# Patient Record
Sex: Female | Born: 1950
Health system: Southern US, Community
[De-identification: ages and names within clinical notes are randomized; demographics above are authoritative.]

## PROBLEM LIST (undated history)

## (undated) DIAGNOSIS — K219 Gastro-esophageal reflux disease without esophagitis: Secondary | ICD-10-CM

## (undated) DIAGNOSIS — I10 Essential (primary) hypertension: Secondary | ICD-10-CM

## (undated) DIAGNOSIS — R32 Unspecified urinary incontinence: Secondary | ICD-10-CM

## (undated) DIAGNOSIS — E7800 Pure hypercholesterolemia, unspecified: Secondary | ICD-10-CM

## (undated) DIAGNOSIS — E119 Type 2 diabetes mellitus without complications: Secondary | ICD-10-CM

## (undated) DIAGNOSIS — G4733 Obstructive sleep apnea (adult) (pediatric): Secondary | ICD-10-CM

## (undated) DIAGNOSIS — J310 Chronic rhinitis: Secondary | ICD-10-CM

## (undated) HISTORY — DX: Essential (primary) hypertension: I10

## (undated) HISTORY — DX: Unspecified urinary incontinence: R32

## (undated) HISTORY — DX: Chronic rhinitis: J31.0

## (undated) HISTORY — PX: UVULOPALATOPHARYNGOPLASTY: SHX827

## (undated) HISTORY — DX: Gastro-esophageal reflux disease without esophagitis: K21.9

## (undated) HISTORY — DX: Obstructive sleep apnea (adult) (pediatric): G47.33

## (undated) HISTORY — PX: TOTAL ABDOMINAL HYSTERECTOMY: SHX209

## (undated) HISTORY — PX: CHOLECYSTECTOMY: SHX55

## (undated) HISTORY — PX: LIPOMA EXCISION: SHX5283

## (undated) HISTORY — DX: Type 2 diabetes mellitus without complications: E11.9

---

## 1999-04-24 ENCOUNTER — Ambulatory Visit (HOSPITAL_COMMUNITY): Admission: RE | Admit: 1999-04-24 | Discharge: 1999-04-24 | Payer: Self-pay | Admitting: Internal Medicine

## 1999-04-24 ENCOUNTER — Encounter: Payer: Self-pay | Admitting: Internal Medicine

## 1999-05-09 ENCOUNTER — Encounter: Admission: RE | Admit: 1999-05-09 | Discharge: 1999-08-07 | Payer: Self-pay | Admitting: Internal Medicine

## 2000-04-26 ENCOUNTER — Ambulatory Visit (HOSPITAL_COMMUNITY): Admission: RE | Admit: 2000-04-26 | Discharge: 2000-04-26 | Payer: Self-pay | Admitting: General Practice

## 2000-04-26 ENCOUNTER — Encounter: Payer: Self-pay | Admitting: General Practice

## 2000-06-02 ENCOUNTER — Encounter: Admission: RE | Admit: 2000-06-02 | Discharge: 2000-06-29 | Payer: Self-pay | Admitting: Neurological Surgery

## 2000-07-26 ENCOUNTER — Other Ambulatory Visit: Admission: RE | Admit: 2000-07-26 | Discharge: 2000-07-26 | Payer: Self-pay | Admitting: Family Medicine

## 2001-04-21 ENCOUNTER — Encounter: Admission: RE | Admit: 2001-04-21 | Discharge: 2001-07-20 | Payer: Self-pay | Admitting: *Deleted

## 2001-04-28 ENCOUNTER — Ambulatory Visit (HOSPITAL_COMMUNITY): Admission: RE | Admit: 2001-04-28 | Discharge: 2001-04-28 | Payer: Self-pay | Admitting: General Practice

## 2001-04-28 ENCOUNTER — Encounter: Payer: Self-pay | Admitting: General Practice

## 2001-07-12 ENCOUNTER — Ambulatory Visit (HOSPITAL_COMMUNITY): Admission: RE | Admit: 2001-07-12 | Discharge: 2001-07-12 | Payer: Self-pay | Admitting: Pulmonary Disease

## 2001-07-12 ENCOUNTER — Encounter: Payer: Self-pay | Admitting: Pulmonary Disease

## 2002-05-15 ENCOUNTER — Encounter: Payer: Self-pay | Admitting: Cardiovascular Disease

## 2002-05-15 ENCOUNTER — Ambulatory Visit (HOSPITAL_COMMUNITY): Admission: RE | Admit: 2002-05-15 | Discharge: 2002-05-15 | Payer: Self-pay | Admitting: Cardiovascular Disease

## 2002-07-18 ENCOUNTER — Encounter: Payer: Self-pay | Admitting: Sports Medicine

## 2002-07-18 ENCOUNTER — Ambulatory Visit (HOSPITAL_COMMUNITY): Admission: RE | Admit: 2002-07-18 | Discharge: 2002-07-18 | Payer: Self-pay | Admitting: Sports Medicine

## 2002-10-12 ENCOUNTER — Ambulatory Visit (HOSPITAL_COMMUNITY): Admission: RE | Admit: 2002-10-12 | Discharge: 2002-10-12 | Payer: Self-pay | Admitting: Gastroenterology

## 2002-10-13 ENCOUNTER — Encounter: Admission: RE | Admit: 2002-10-13 | Discharge: 2002-10-13 | Payer: Self-pay | Admitting: Gastroenterology

## 2002-10-13 ENCOUNTER — Encounter: Payer: Self-pay | Admitting: Gastroenterology

## 2002-10-24 ENCOUNTER — Encounter (INDEPENDENT_AMBULATORY_CARE_PROVIDER_SITE_OTHER): Payer: Self-pay

## 2002-10-24 ENCOUNTER — Observation Stay (HOSPITAL_COMMUNITY): Admission: RE | Admit: 2002-10-24 | Discharge: 2002-10-25 | Payer: Self-pay

## 2003-07-19 ENCOUNTER — Ambulatory Visit (HOSPITAL_COMMUNITY): Admission: RE | Admit: 2003-07-19 | Discharge: 2003-07-19 | Payer: Self-pay | Admitting: Sports Medicine

## 2003-11-25 ENCOUNTER — Emergency Department (HOSPITAL_COMMUNITY): Admission: EM | Admit: 2003-11-25 | Discharge: 2003-11-25 | Payer: Self-pay | Admitting: Emergency Medicine

## 2005-08-05 ENCOUNTER — Encounter: Admission: RE | Admit: 2005-08-05 | Discharge: 2005-08-05 | Payer: Self-pay | Admitting: General Practice

## 2005-10-21 ENCOUNTER — Ambulatory Visit: Payer: Self-pay | Admitting: Gastroenterology

## 2005-10-27 ENCOUNTER — Ambulatory Visit: Payer: Self-pay | Admitting: Gastroenterology

## 2005-11-30 ENCOUNTER — Ambulatory Visit (HOSPITAL_BASED_OUTPATIENT_CLINIC_OR_DEPARTMENT_OTHER): Admission: RE | Admit: 2005-11-30 | Discharge: 2005-11-30 | Payer: Self-pay | Admitting: Otolaryngology

## 2005-12-06 ENCOUNTER — Ambulatory Visit: Payer: Self-pay | Admitting: Internal Medicine

## 2006-01-01 ENCOUNTER — Encounter (INDEPENDENT_AMBULATORY_CARE_PROVIDER_SITE_OTHER): Payer: Self-pay | Admitting: Specialist

## 2006-01-03 ENCOUNTER — Inpatient Hospital Stay (HOSPITAL_COMMUNITY): Admission: RE | Admit: 2006-01-03 | Discharge: 2006-01-08 | Payer: Self-pay | Admitting: Pulmonary Disease

## 2006-01-03 ENCOUNTER — Ambulatory Visit: Payer: Self-pay | Admitting: Pulmonary Disease

## 2006-03-09 ENCOUNTER — Emergency Department (HOSPITAL_COMMUNITY): Admission: EM | Admit: 2006-03-09 | Discharge: 2006-03-09 | Payer: Self-pay | Admitting: Family Medicine

## 2006-04-05 ENCOUNTER — Ambulatory Visit (HOSPITAL_BASED_OUTPATIENT_CLINIC_OR_DEPARTMENT_OTHER): Admission: RE | Admit: 2006-04-05 | Discharge: 2006-04-05 | Payer: Self-pay | Admitting: Otolaryngology

## 2006-04-11 ENCOUNTER — Ambulatory Visit: Payer: Self-pay | Admitting: Internal Medicine

## 2006-06-04 ENCOUNTER — Ambulatory Visit: Payer: Self-pay | Admitting: Internal Medicine

## 2006-07-09 ENCOUNTER — Ambulatory Visit: Payer: Self-pay | Admitting: Internal Medicine

## 2006-08-09 ENCOUNTER — Ambulatory Visit (HOSPITAL_COMMUNITY): Admission: RE | Admit: 2006-08-09 | Discharge: 2006-08-09 | Payer: Self-pay | Admitting: General Practice

## 2006-08-23 ENCOUNTER — Ambulatory Visit: Payer: Self-pay | Admitting: Internal Medicine

## 2008-05-24 ENCOUNTER — Ambulatory Visit (HOSPITAL_COMMUNITY): Admission: RE | Admit: 2008-05-24 | Discharge: 2008-05-24 | Payer: Self-pay | Admitting: General Practice

## 2008-12-18 ENCOUNTER — Emergency Department (HOSPITAL_COMMUNITY): Admission: EM | Admit: 2008-12-18 | Discharge: 2008-12-18 | Payer: Self-pay | Admitting: Emergency Medicine

## 2009-03-12 ENCOUNTER — Ambulatory Visit: Payer: Self-pay | Admitting: Internal Medicine

## 2009-03-12 DIAGNOSIS — J3089 Other allergic rhinitis: Secondary | ICD-10-CM

## 2009-03-12 DIAGNOSIS — J302 Other seasonal allergic rhinitis: Secondary | ICD-10-CM | POA: Insufficient documentation

## 2009-03-12 DIAGNOSIS — G4733 Obstructive sleep apnea (adult) (pediatric): Secondary | ICD-10-CM | POA: Insufficient documentation

## 2009-03-15 DIAGNOSIS — J209 Acute bronchitis, unspecified: Secondary | ICD-10-CM | POA: Insufficient documentation

## 2009-05-16 ENCOUNTER — Ambulatory Visit: Payer: Self-pay | Admitting: Internal Medicine

## 2009-05-16 DIAGNOSIS — K219 Gastro-esophageal reflux disease without esophagitis: Secondary | ICD-10-CM | POA: Insufficient documentation

## 2009-05-16 DIAGNOSIS — I10 Essential (primary) hypertension: Secondary | ICD-10-CM | POA: Insufficient documentation

## 2009-05-16 DIAGNOSIS — J018 Other acute sinusitis: Secondary | ICD-10-CM | POA: Insufficient documentation

## 2009-06-10 ENCOUNTER — Encounter: Admission: RE | Admit: 2009-06-10 | Discharge: 2009-06-10 | Payer: Self-pay | Admitting: General Practice

## 2010-03-30 ENCOUNTER — Encounter: Payer: Self-pay | Admitting: General Practice

## 2010-03-30 ENCOUNTER — Encounter: Payer: Self-pay | Admitting: Sports Medicine

## 2010-04-08 NOTE — Assessment & Plan Note (Signed)
Summary: allergies///kp   Primary Provider/Referring Provider:  Louanna Raw  CC:  Increased Allergy trouble-went to Dr. Earlene Plater approx 2-3 weeks ago; gave shot and pred pak-no relief. sneezing, itching, and and runny nose. Marland Kitchen  History of Present Illness: 08/29/06 -1. Obstructive sleep apnea/UPPP/CPAP. 2. Rhinitis.  HISTORY:  She continues CPAP at 15 CWP and says life is better.  She is getting enough sleep and is comfortable wearing it with no major issues. Sometimes has some runny nose and sneezing and we talked about overdrying as one possibility.  She does have a humidifier attachment. She thinks she got better with the drop in pressure from 16 to 15.   March 12, 2009--Presents for an acute office visit. Complains of sinus pressure/congestion with white drainage, PND causing cough, increased SOB, wheezing, chills/sweats x3weeks Denies chest pain, dyspnea, orthopnea, hemoptysis, fever, n/v/d, edema, headache,recent travel or antibiotics.    May 16, 2009- OSA, Rhinitis........................................family here Treatment here in january helped only 2 days. Nasal neb and Zpak may heave helped. Complains of nasal congestion , ears pressure. No pain. Coughs up some phlegm. Much nose blowing. Clear mucus with a lot of sneeze. some variable chest tightness. "Inward fever" with dry mouth. UC gave pred taper and injection 2 weeks ago.        Current Medications (verified): 1)  Nexium 40 Mg Cpdr (Esomeprazole Magnesium) .... Take 1 Capsule By Mouth Before First and Last Meals 2)  Lexapro 10 Mg Tabs (Escitalopram Oxalate) .... Take 1 Tablet By Mouth Once A Day 3)  Metoprolol Tartrate 25 Mg Tabs (Metoprolol Tartrate) .... Take 1 Tablet By Mouth Two Times A Day 4)  Hydrocodone-Acetaminophen 10-650 Mg Tabs (Hydrocodone-Acetaminophen) .... 1/2 -1 Every 6 Hours As Needed  Allergies (verified): No Known Drug Allergies  Past History:  Family History: Last updated:  03/12/2009 emphysema/COPD - father asthma - brother #1 heart disease - mother, brother #1 w/ CABG rheumatism - father, both brothers cancer - father (lung) DM - brother #1  Social History: Last updated: 03/12/2009 fomer smoker - quit in 1993, x64yrs, 1/4ppd no alcohol divorced 4 children retired - Psychiatric nurse  Risk Factors: Smoking Status: quit (03/12/2009)  Past Medical History:  RHINITIS (ICD-472.0) OBSTRUCTIVE SLEEP APNEA (ICD-327.23) G E R D Hypertension  Past Surgical History: UPPP for snoring Cholecystectomy T A H and B S O Lipoma  Review of Systems      See HPI       The patient complains of fever and dyspnea on exertion.  The patient denies anorexia, weight loss, weight gain, vision loss, decreased hearing, hoarseness, chest pain, syncope, peripheral edema, prolonged cough, headaches, hemoptysis, abdominal pain, and severe indigestion/heartburn.    Vital Signs:  Patient profile:   60 year old female Height:      64 inches Weight:      252 pounds BMI:     43.41 O2 Sat:      100 % on Room air Pulse rate:   78 / minute BP sitting:   144 / 82  (left arm) Cuff size:   regular  Vitals Entered By: Reynaldo Minium CMA (May 16, 2009 2:12 PM)  O2 Flow:  Room air  Physical Exam  Additional Exam:  General: A/Ox3; pleasant and cooperative, NAD, overweight SKIN: no rash, lesions NODES: no lymphadenopathy HEENT: [place]/AT, EOM- WNL, Conjuctivae- clear, PERRLA, TM-WNL, Nose- clear, mucus bridging, Throat- clear. Mallampati  III s/p UPPP NECK: Supple w/ fair ROM, JVD- none, normal carotid impulses w/o bruits Thyroid-  normal to palpation CHEST: Clear to P&A HEART: RRR, no m/g/r heard ABDOMEN: Soft and nl; ZOX:WRUE, nl pulses, no edema  NEURO: Grossly intact to observation      Impression & Recommendations:  Problem # 1:  RHINITIS (ICD-472.0)  Rhinitis or rhinosinusitis. Repeated steroids didn't help although report of sneezing itching and rhinorhea  suggests allergy.   We discussed Neti pot, antihjistamines and decongestants.  Problem # 2:  OBSTRUCTIVE SLEEP APNEA (ICD-327.23) Assessment: Comment Only  Other Orders: Est. Patient Level III (45409)  Patient Instructions: 1)  Please schedule a follow-up appointment as needed. 2)  Try the Neti pot approach for rinsing your nasal passages 3)  Try Sudafed-PE otc as a decongestnat if needed for stuffiness. This may raise your blood pressure if you take it more than once daily 4)  Try an otc antihistamine as needed for drainage, itching or sneezing- Claritin/ loratadine and allegra/ fexofenadine are good choices.

## 2010-04-08 NOTE — Assessment & Plan Note (Signed)
Summary: Acute NP office visit   CC:  sinus pressure/congestion with white drainage, PND causing cough, increased SOB, wheezing, and chills/sweats x3weeks.  History of Present Illness: PROBLEMS: 1. Obstructive sleep apnea/UPPP/CPAP. 2. Rhinitis.   March 12, 2009--Presents for an acute office visit. Complains of sinus pressure/congestion with white drainage, PND causing cough, increased SOB, wheezing, chills/sweats x3weeks Denies chest pain, dyspnea, orthopnea, hemoptysis, fever, n/v/d, edema, headache,recent travel or antibiotics.      Preventive Screening-Counseling & Management  Alcohol-Tobacco     Smoking Status: quit  Medications Prior to Update: 1)  None  Current Medications (verified): 1)  Nexium 40 Mg Cpdr (Esomeprazole Magnesium) .... Take 1 Capsule By Mouth Before First and Last Meals 2)  Lexapro 10 Mg Tabs (Escitalopram Oxalate) .... Take 1 Tablet By Mouth Once A Day 3)  Metoprolol Tartrate 25 Mg Tabs (Metoprolol Tartrate) .... Take 1 Tablet By Mouth Two Times A Day 4)  Hydrocodone-Acetaminophen 10-650 Mg Tabs (Hydrocodone-Acetaminophen) .... 1/2 -1 Every 6 Hours As Needed 5)  Soma 350 Mg Tabs (Carisoprodol) .... 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  Risk Factors: Smoking Status: quit (03/12/2009)  Past Medical History:   RHINITIS (ICD-472.0) OBSTRUCTIVE SLEEP APNEA (ICD-327.23)  Family History: emphysema/COPD - father asthma - brother #1 heart disease - mother, brother #1 w/ CABG rheumatism - father,  both brothers cancer - father (lung) DM - brother #1  Social History: fomer smoker - quit in 1993, x52yrs, 1/4ppd no alcohol 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 No Comments Medications reviewed with patient Daytime contact number verified with patient. Boone Master CNA  March 12, 2009 4:46 PM    Physical Exam  Additional Exam:  GEN: A/Ox3; pleasant , NAD HEENT:  [place]/AT, , EACs-clear, TMs-wnl, NOSE-clear, THROAT-clear NECK:  Supple w/ fair ROM; no JVD; normal carotid impulses w/o bruits; no thyromegaly or nodules palpated; no lymphadenopathy. RESP  Coarse BS w/ no wheezing.  CARD:  RRR, no m/r/g   GI:   Soft & nt; nml bowel sounds; no organomegaly or masses detected. [place]: Warm bil,  no calf tenderness edema, clubbing, pulses intact Neuro: intact w/ no focal deficits noted.    Impression & Recommendations:  Problem # 1:  BRONCHITIS, ACUTE (ICD-466.0) Flare   REC:  Zpack take as directed.  Mucinex DM two times a day as needed cough/congestion Saline nasal rinses as needed  Zyrtec 10mg  at bedtime as needed drainage.  Please contact office for sooner follow up if symptoms do not improve or worsen  follow up Dr. Maple Hudson in 3 months and as needed     Her updated medication list for this problem includes:    Zithromax Z-pak 250 Mg Tabs (Azithromycin) .Marland Kitchen... Take as directed.  Medications Added to Medication List This  Visit: 1)  Nexium 40 Mg Cpdr (Esomeprazole magnesium) .... Take 1 capsule by mouth before first and last meals 2)  Lexapro 10 Mg Tabs (Escitalopram oxalate) .... Take 1 tablet by mouth once a day 3)  Metoprolol Tartrate 25 Mg Tabs (Metoprolol tartrate) .... Take 1 tablet by mouth two times a day 4)  Hydrocodone-acetaminophen 10-650 Mg Tabs (Hydrocodone-acetaminophen) .... 1/2 -1 every 6 hours as needed 5)  Soma 350 Mg Tabs (Carisoprodol) .... Take 1 tab by mouth at bedtime or up to 1 two times a day as needed 6)  Zithromax Z-pak 250 Mg Tabs (Azithromycin) .... Take as directed.  Complete Medication List: 1)  Nexium 40 Mg Cpdr (Esomeprazole magnesium) .... Take 1 capsule by mouth before first and last meals 2)  Lexapro 10 Mg Tabs (Escitalopram oxalate) .... Take 1 tablet by mouth once a day 3)  Metoprolol Tartrate 25 Mg Tabs (Metoprolol tartrate) .... Take 1 tablet by mouth two times a day 4)  Hydrocodone-acetaminophen 10-650 Mg Tabs (Hydrocodone-acetaminophen) .... 1/2 -1 every 6 hours as needed 5)  Soma 350 Mg Tabs (Carisoprodol) .... Take 1 tab by mouth at bedtime or up to 1 two times a day as needed 6)  Zithromax Z-pak 250 Mg Tabs (Azithromycin) .... Take as directed.  Other Orders: Nebulizer Tx (16109) Est. Patient Level IV (60454)  Patient Instructions: 1)  Zpack take as directed.  2)  Mucinex DM two times a day as needed cough/congestion 3)  Saline nasal rinses as needed  4)  Zyrtec 10mg  at bedtime as needed drainage.  5)  Please contact office for sooner follow up if symptoms do not improve or worsen  6)  follow up Dr. Maple Hudson in 3 months and as needed  Prescriptions: ZITHROMAX Z-PAK 250 MG TABS (AZITHROMYCIN) take as directed.  #1 x 0   Entered and Authorized by:   Rubye Oaks NP   Signed by:   Tammy Parrett NP on 03/12/2009   Method used:   Electronically to        [facility] (retail)       [address]       [place],  [place]  [id number]       Ph: [phone]       Fax: [phone]   RxID:   [id number]    Immunization History:  Influenza Immunization History:    Influenza:  historical (01/08/2008)

## 2010-04-10 ENCOUNTER — Telehealth: Payer: Self-pay | Admitting: Internal Medicine

## 2010-04-11 ENCOUNTER — Ambulatory Visit (INDEPENDENT_AMBULATORY_CARE_PROVIDER_SITE_OTHER): Payer: Self-pay | Admitting: Internal Medicine

## 2010-04-11 ENCOUNTER — Encounter: Payer: Self-pay | Admitting: Internal Medicine

## 2010-04-11 DIAGNOSIS — J310 Chronic rhinitis: Secondary | ICD-10-CM

## 2010-04-11 DIAGNOSIS — G4733 Obstructive sleep apnea (adult) (pediatric): Secondary | ICD-10-CM

## 2010-04-16 NOTE — Progress Notes (Signed)
Summary: req to see cy/ allergies  Phone Note Call from Patient   Caller: Patient Call For: young Summary of Call: pt wants to be worked in w/ dr young tomorrow or mond re: allergies that are "going crazy". pt # [id number] Initial call taken by: Tivis Ringer, CNA,  April 10, 2010 11:29 AM  Follow-up for Phone Call        Pt set to see CY tomorrow at 10am. Carron Curie CMA  April 10, 2010 12:15 PM

## 2010-04-16 NOTE — Assessment & Plan Note (Signed)
Summary: allergies//jrc   Vital Signs:  Patient profile:   60 year old female Height:      64 inches Weight:      259.50 pounds BMI:     44.70 O2 Sat:      100 % on Room air Pulse rate:   82 / minute BP sitting:   128 / 60  (right arm) Cuff size:   large  Vitals Entered By: Gweneth Dimitri RN (April 11, 2010 10:14 AM)  O2 Flow:  Room air CC: Acute Visit.  sneezing, runny nose, itchy eyes, hoarseness, sinus pressure - off and on x  1 month. Comments Medications reviewed with patient Daytime contact number verified with patient. Gweneth Dimitri RN  April 11, 2010 10:15 AM    Primary Provider/Referring Provider:  Louanna Raw  CC:  Acute Visit.  sneezing, runny nose, itchy eyes, hoarseness, and sinus pressure - off and on x  1 month..  History of Present Illness: 08/29/06 -1. Obstructive sleep apnea/UPPP/CPAP. 2. Rhinitis.  HISTORY:  She continues CPAP at 15 CWP and says life is better.  She is getting enough sleep and is comfortable wearing it with no major issues. Sometimes has some runny nose and sneezing and we talked about overdrying as one possibility.  She does have a humidifier attachment. She thinks she got better with the drop in pressure from 16 to 15.   March 12, 2009--Presents for an acute office visit. Complains of sinus pressure/congestion with white drainage, PND causing cough, increased SOB, wheezing, chills/sweats x3weeks Denies chest pain, dyspnea, orthopnea, hemoptysis, fever, n/v/d, edema, headache,recent travel or antibiotics.    May 16, 2009- OSA, Rhinitis........................................family here Treatment here in january helped only 2 days. Nasal neb and Zpak may heave helped. Complains of nasal congestion , ears pressure. No pain. Coughs up some phlegm. Much nose blowing. Clear mucus with a lot of sneeze. some variable chest tightness. "Inward fever" with dry mouth. UC gave pred taper and injection 2 weeks ago.  April 11, 2010 OSA,  Rhinitis........................................child with her Nurse-CC: Acute Visit.  sneezing, runny nose, itchy eyes, hoarseness, sinus pressure - off and on x  1 month. // OSA-She continues to use CPAP at 15 all night every night and is confident it works well and helps her.  Allergy- for past month noting itching eyes, runny nose. OTC meds leave her tired and drained- benadryl. Denies infection, headache, fever or purulence. Minimal wheeze occasionally. Has no rescue inhaler.    Preventive Screening-Counseling & Management  Alcohol-Tobacco     Smoking Status: quit     Packs/Day: 0.5     Year Quit: 1992  Current Medications (verified): 1)  Nexium 40 Mg Cpdr (Esomeprazole Magnesium) .... Take 1 Tablet By Mouth Once A Day 2)  Lexapro 10 Mg Tabs (Escitalopram Oxalate) .... Take 1 Tablet By Mouth Once A Day 3)  Metoprolol Tartrate 25 Mg Tabs (Metoprolol Tartrate) .... Take 1 Tablet By Mouth Two Times A Day 4)  Hydrocodone-Acetaminophen 10-650 Mg Tabs (Hydrocodone-Acetaminophen) .... 1/2 -1 Every 6 Hours As Needed  Allergies (verified): No Known Drug Allergies  Past History:  Past Medical History: Last updated: 05/16/2009  RHINITIS (ICD-472.0) OBSTRUCTIVE SLEEP APNEA (ICD-327.23) G E R D Hypertension  Past Surgical History: Last updated: 05/16/2009 UPPP for snoring Cholecystectomy T A H and B S O Lipoma  Family History: Last updated: 03/12/2009 emphysema/COPD - father asthma - brother #1 heart disease - mother, brother #1 w/ CABG rheumatism - father, both  brothers cancer - father (lung) DM - brother #1  Social History: Last updated: 03/12/2009 fomer smoker - quit in 1993, x105yrs, 1/4ppd no alcohol 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  The patient denies shortness of breath with activity, shortness of breath at rest, productive cough, non-productive cough, coughing up blood, chest pain, irregular heartbeats, acid heartburn, indigestion, loss of appetite, weight change, abdominal pain, difficulty swallowing, sore throat, tooth/dental problems, headaches, anxiety, hand/feet swelling, rash, and change in color of mucus.    Physical Exam  Additional Exam:  General: A/Ox3; pleasant and cooperative, NAD, overweight SKIN: no rash, lesions NODES: no lymphadenopathy HEENT: [place]/AT, EOM- WNL, Conjuctivae- clear, PERRLA, TM-WNL, Nose- clear, mucus bridging, snorting, Throat- clear. Mallampati  III s/p UPPP NECK: Supple w/ fair ROM, JVD- none, normal carotid impulses w/o bruits Thyroid- normal to palpation CHEST: Clear to P&A HEART: RRR, no m/g/r heard ABDOMEN: Soft and nl; ZOX:WRUE, nl pulses, no edema  NEURO: Grossly intact to observation      Impression & Recommendations:  Problem # 1:  OBSTRUCTIVE SLEEP APNEA (ICD-327.23)  Good compliance and control. She is comfortable with the current mask and pressure.   Problem # 2:  RHINITIS (ICD-472.0)  She seems to be describing an allergic rhinitis despite the time of year. Much discomfort with rhinorhea and itching, watering eyes.  She was skin tested many years ago broadly positive. She thinks she needed vaccine then. She has used several steroid nasal sprays which work for [employer] then seem to lose effect. We will try Astepro but I wanted her to try a nonsedating antihistamine and will try loratadine with discusion.   Problem # 3:  BRONCHITIS, ACUTE (ICD-466.0) Chest is clear now. She has noted a little wheeze at times, but doesn't describe it as significant. Watch for asthma and need for a rescue inhaler.   Medications Added to Medication List This Visit: 1)  Nexium 40 Mg Cpdr (Esomeprazole magnesium) .... Take 1 tablet by mouth once a  day 2)  Cpap 15 [facility]  3)  Astepro 0.15 % Soln (Azelastine hcl) .Marland Kitchen.. 1-2 puffs each nostril two times a day as needed 4)  Loratadine 10 Mg Tabs (Loratadine) .Marland Kitchen.. 1 daily as needed allergy  Other Orders: Est. Patient Level IV (45409)  Patient Instructions: 1)  Please schedule a follow-up appointment in 1 year. 2)  Sample/ script Astepro nasal antihistamine spray 3)    1-2 puffs two times a day as needed allergy 4)  Script for loratadine/ Claritin antihistamine, non drowsy 5)      This is over the counter, but try showing the script to the pharmacy and see if that helps.  6)  We can get you back as needed and can do the allergy skin testing if needed.  Prescriptions: LORATADINE 10 MG TABS (LORATADINE) 1 daily as needed allergy  #30 x prn   Entered and Authorized by:   Waymon Budge MD   Signed by:   Waymon Budge MD on 04/11/2010   Method used:   Print then Give to Patient   RxID:   [id number] ASTEPRO 0.15 % SOLN (AZELASTINE HCL) 1-2 puffs each nostril two times a day as needed  #1 x prn  Entered and Authorized by:   Waymon Budge MD   Signed by:   Waymon Budge MD on 04/11/2010   Method used:   Print then Give to Patient   RxID:   [id number]    Orders Added: 1)  Est. Patient Level IV [78469]   Immunization History:  Influenza Immunization History:    Influenza:  historical (01/07/2010)   Immunization History:  Influenza Immunization History:    Influenza:  Historical (01/07/2010)

## 2010-05-19 ENCOUNTER — Other Ambulatory Visit: Payer: Self-pay | Admitting: General Practice

## 2010-05-19 DIAGNOSIS — Z1231 Encounter for screening mammogram for malignant neoplasm of breast: Secondary | ICD-10-CM

## 2010-06-12 ENCOUNTER — Ambulatory Visit
Admission: RE | Admit: 2010-06-12 | Discharge: 2010-06-12 | Disposition: A | Discharge status: Home / Self Care | Payer: Medicaid Other | Source: Ambulatory Visit | Attending: General Practice | Admitting: General Practice

## 2010-06-12 DIAGNOSIS — Z1231 Encounter for screening mammogram for malignant neoplasm of breast: Secondary | ICD-10-CM

## 2010-07-22 NOTE — Assessment & Plan Note (Signed)
[facility]                             PULMONARY OFFICE NOTE   Jocelyn, Little                    MRN:          [id number]  DATE:08/23/2006                            DOB:          August 24, 1950    PULMONARY OUTPATIENT FOLLOW-UP:   PROBLEMS:  1. Obstructive sleep apnea/UPPP/CPAP.  2. Rhinitis.   HISTORY:  She continues CPAP at 15 CWP and says life is better.  She is  getting enough sleep and is comfortable wearing it with no major issues.  Sometimes has some runny nose and sneezing and we talked about  overdrying as one possibility.  She does have a humidifier attachment.  She thinks she got better with the drop in pressure from 16 to 15.   OBJECTIVE:  VITAL SIGNS:  Weight 249 pounds, BP 124/68, pulse 81, room  air saturation 97%.  HEENT:  There is some white mucus in the nasopharynx.  Her throat is not  red.  Speech quality is normal.  There is no stridor.  CHEST:  Clear.  CARDIAC:  Heart sounds normal.   IMPRESSION:  1. Obstructive sleep apnea, now well-controlled on continuous positive      airway pressure at 15 CWP with past history of      uvulopalatopharyngoplasty.  2. Nonspecific rhinitis with sneezing.   PLAN:  We discussed overdrying versus the use of antihistamine-type  drying agents for her nose.  I discussed the availability of Claritin  and gave a sample of Astelin for her to try once more.  She used it a  few years ago and could not remember.  She will continue CPAP at 15.  Schedule return in 3 months, earlier p.r.n.     Clinton D. Maple Hudson, MD, Tonny Bollman, FACP  Electronically Signed    CDY/MedQ  DD: 08/29/2006  DT: 08/30/2006  Job #: [id number]   cc:   Newman Pies, MD
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
## 2010-07-25 NOTE — Discharge Summary (Signed)
Jocelyn, Little           ACCOUNT NO.:  [id number]   MEDICAL RECORD NO.:  [id number]          PATIENT TYPE:  INP   LOCATION:  6710                         FACILITY:  [facility]   PHYSICIAN:  Newman Pies, MD            DATE OF BIRTH:  02/25/1951   DATE OF ADMISSION:  01/01/2006  DATE OF DISCHARGE:  01/08/2006                                 DISCHARGE SUMMARY   ADMISSION DIAGNOSES:  1. Severe obstructive sleep apnea.  2. Nasal airway obstruction.  3. Chronic maxillary sinusitis.  4. Bilateral inferior turbinate hypertrophy.   DISCHARGE DIAGNOSES:  1. Severe obstructive sleep apnea.  2. Nasal airway obstruction.  3. Chronic maxillary sinusitis.  4. Bilateral inferior turbinate hypertrophy.  5. Postobstructive pulmonary edema.   PROCEDURE PERFORMED:  1. Uvulopalatopharyngoplasty.  2. Endoscopic bilateral maxillary antrostomy with tissue removal.  3. Bilateral partial inferior turbinate resection.   SERVICES CONSULTED:  1. Critical care medicine.  2. Physical therapy.  3. Speech therapy.   HISTORY OF PRESENT ILLNESS:  Jocelyn Little is a 60 year old African-  American female with a history of severe obstructive sleep apnea and chronic  sinusitis.  She also complained of chronic bilateral nasal airway  obstruction, causing significant difficulty with using her CPAP machines.   On examination, patient was noted to have bilateral inferior turbinate  hypertrophy and a chronic purulent drainage of the maxillary antrums.  CT  scan shows bilateral chronic maxillary sinusitis.  Due to the chronicity of  her illnesses, patient would like to seek physical intervention to improve  her obstructive sleep apnea as well as to treat her nasal airway obstruction  and chronic sinusitis.  Sleep study was performed and this showed  respiratory stress index of greater than 100.  Patient was consulted due to  the severity of sleep apnea.  Surgical intervention may not completely cure  her  sleep apnea.  However, physical approach such as  uvulopalatopharyngoplasty may lessen her demand on the CPAP machine, such as  decreasing pressure needed to achieve a good oxygen saturations.  After the  risks, benefits, alternatives, and details of the surgical procedures were  discussed, patient would like to proceed with the procedures.  On January 01, 2006, patient was admitted for the above-needed procedures.   HOSPITAL COURSE:  Patient now underwent procedure successfully without any  complications.  Postoperatively, patient was transferred to the stepdown K  unit for overnight observation.  She did well overnight with no difficulty.  Her oxygen saturation was maintained above 90% with oxygen by nasal cannula.  On postop day #1, patient was transferred to the regular surgical floor.  However, during that day, patient gradually developed respiratory distress  with decreasing oxygen saturations.  And chest x-ray performed that day  showed significant pulmonary edema.  As a result, she was intubated and  transferred to the intensive care unit.  Critical care medicines and service  was consulted for vent management and critical care.  She was placed on IV  Lasix and IV Decadron.  Her postop obstructive pulmonary edema gradually  improved over the subsequent  two days.  On January 05, 2006, she was  extubated successfully.  She was offered to observe in a intensive care unit  for one additional day.  Her oxygen saturations remained stable above 90  degrees even on room air.  Speech pathology service was also consulted to  rule out any aspirations.  Functional endoscopic evaluation of swallow was  performed.  Patient was noted to have no aspirations.  She was started on a  p.o. diet which she tolerated well.  She was transferred from intensive care  unit to stepdown K unit on January 06, 2006.  She continued to do well with  good oxygen saturations even on room air.  On January 07, 2006,  she was  transferred to the regular surgical floor.  After extubations, physical  therapist was also consulted to help with strength reconditioning.  It was  recommended that patient should have continue home care physical therapy.  In addition, a rolling walker was ordered for the patient.  On January 08, 2006, patient was discharged home in good condition.  At the time of  discharge, patient was tolerating diet well.  She was able to ambulate with  help of rolling walker.   DISCHARGE MEDICATIONS:  Patient was instructed to resume home medications.  In addition, she was given:  1. Lortab elixir 15 mL p.o. q. 4-6 h. p.r.n. pain  2. Keflex 500 mg p.o. q.d. for five additional days.   POSTOPERATIVE CARE:  [facility] for [facility] care.   DISCHARGE DIET:  Soft mechanical diet, advance as tolerated.   POSTOPERATIVE FOLLOWUP:  Patient will be seen in my office in one week.      Newman Pies, MD  Electronically Signed     ST/MEDQ  D:  01/08/2006  T:  01/09/2006  Job:  [id number]

## 2010-07-25 NOTE — Op Note (Signed)
NAMEISOBEL, EISENHUTH NO.:  [id number]   MEDICAL RECORD NO.:  [id number]          PATIENT TYPE:  INP   LOCATION:  2111                         FACILITY:  [facility]   PHYSICIAN:  Newman Pies, MD            DATE OF BIRTH:  13-Jun-1950   DATE OF PROCEDURE:  01/01/2006  DATE OF DISCHARGE:                                 OPERATIVE REPORT   SURGEON:  Karle Barr, MD.   PREOPERATIVE DIAGNOSES:  1. Bilateral chronic maxillary sinusitis.  2. Bilateral nasal airway obstruction.  3. Bilateral inferior turbinate hypertrophy.  4. Severe obstructive sleep apnea.   POSTOPERATIVE DIAGNOSES:  1. Bilateral chronic maxillary sinusitis.  2. Bilateral nasal airway obstruction.  3. Bilateral inferior turbinate hypertrophy.  4. Severe obstructive sleep apnea.   PROCEDURES PERFORMED:  1. Endoscopic bilateral maxillary antrostomy with tissue removal.  2. Bilateral partial inferior turbinate resection.  3. Uvulopalatopharyngoplasty.   ANESTHESIA:  General endotracheal tube anesthesia.   COMPLICATIONS:  None.   ESTIMATED BLOOD LOSS:  100 cc.   INDICATIONS FOR PROCEDURE:  The patient is a 60 year old African American  female with a history of severe obstructive sleep apnea, as documented on  polysomnography study.  In addition, she has been experiencing chronic nasal  obstructions, chronic rhinosinusitis and bilateral inferior turbinate  hypertrophy.  She was previously fitted with a CPAP machine.  However, due  to her chronic nasal obstruction and chronic rhinosinusitis, she has not  been able to tolerate her CPAP.  She would like to have surgical  intervention to improve her nasal breathing, as well as to reduce her  reliance on her CPAP.  In light of the severity of her sleep apnea, she was  noted that the surgery likely will not completely wean her off the CPAP  machine.  Rather, it may improve her tolerance of the CPAP machine.  The  risks, benefits, alternatives and details of  the procedures were discussed  with the patient.  She would like to proceed with the above-stated  procedures.  All questions were answered and informed consent was obtained.   DESCRIPTION OF THE PROCEDURES:  The patient was taken to the operating room  and placed supine on the operating table.  General endotracheal tube  anesthesia was administered by the anesthesiologist.  Preoperative IV  antibiotics and Decadron were given.  The patient was then positioned and  prepped and draped in a standard fashion for nasal surgery.  Pledgets soaked  with Afrin were placed in both nasal cavities for vasoconstriction.  The  pledgets were subsequently removed.  Under the guidance of the 0-degree  endoscope, 1% lidocaine with 1:100,000 epinephrine was injected around the  inferior and middle turbinates, as well as the lateral nasal wall  bilaterally.  Attention was first focused on the right side.  The patient  was noted to have severe inferior turbinate hypertrophy and nasal mucosal  congestion.  The inferior half of the interrupted turbinate was then clamped  with a hemostat.  The inferior half of the inferior turbinate was then  resected using a cross-cutting scissors.  Hemostasis was achieved using Bucy  suction electrocautery.  Attention was then focused on performing the  maxillary antrostomy.  A standard uncinectomy was performed.  The middle  turbinate was medialized using a Therapist, nutritional.  The maxillary antrum was  then entered using a maxillary probe.  The antrum was enlarged using a  combination of a backbiter and a Blakesley and Tru-Cut forceps.  Polypoid  mucosa was noted within the maxillary antrum.  The mucosa was removed using  a Blakesley forceps.  Hemostasis was achieved using pledgets soaked with  Afrin.  Attention was then focused on the left side.  The standard inferior  turbinate partial resection was performed in the standard fashion similar to  the right side.  Hemostasis,  again, was achieved using Bucy electrocautery.  A standard uncinectomy was then performed.  The middle turbinate was  medialized.  The maxillary antrum was identified and enlarged using a  combination of backbiter forceps and Blakesley and Tru-Cut forceps.  A  moderate amount of polypoid tissue was removed from around the antrum, as  well as the maxillary cavity.  FloSeal was then applied to the surgical site  in both nasal cavities.   Attention was then focused on performing the uvulopalatopharyngoplasty.  The  oral cavity was exposed using the Crowe-Davis mouth gag.  She was noted to  have 1+ tonsils bilaterally; however, she has a significant amount of  redundant mucosa around the tonsillar fossa, as well as in the pharyngeal  wall.  She also has a significantly enlarged uvula.  A standard  tonsillectomy was performed on each side.  The tonsillectomy was performed  using the Coblator device.  The uvula, as well as the posterior 3 cm of the  soft palate was then resected using the Bovie electrocautery.  Hemostasis  was achieved using a combination of the Coblator device and suction  electrocautery.  The posterior edge of the resected margin was then sutured  to the inferior edge of the resection margin using interrupted 3-0 Vicryl  sutures.  Once both tonsillar fossae were sutured, the soft palate was also  sutured tight to increase tension on the palatal mucosa, as well as the  pharyngeal mucosa.  At the conclusion of the procedure, the patient was  noted to have a widely opened pharyngeal airway.  An orogastric tube was  then passed to evacuate the stomach contents.  The Crowe-Davis mouth gag was  removed, and subsequent inspection of the lips, gums, tongue, teeth and  surrounding structures revealed no evidence of injury.  The care of the  patient was turned over to the anesthesiologist.  The patient was awakened  from anesthesia without difficulty.  She was transferred to the  recovery room in stable condition.   OPERATIVE FINDINGS:  Significant hypertrophied inferior turbinates  bilaterally.  The patient was also noted to have polypoid mucosa around both  maxillary antrum and maxillary sinus cavities.  Significant mucosal  redundancy was also noted within the pharynx.  A standard  uvulopalatopharyngoplasty was performed without difficulty.   SPECIMENS REMOVED:  Bilateral tonsils, uvula, bilateral inferior turbinates  (inferior half), and tissue obtained around the maxillary antrum and  maxillary sinus cavity.   FOLLOWUP CARE:  The patient will be observed in the progressive care unit  overnight.  If the patient does well, she will be discharged home with pain  medication and antibiotics.      Newman Pies, MD  Electronically Signed     ST/MEDQ  D:  01/03/2006  T:  01/03/2006  Job:  [id number]

## 2010-07-25 NOTE — Procedures (Signed)
Jocelyn Little, MALLICOAT NO.:  [id number]   MEDICAL RECORD NO.:  [id number]          PATIENT TYPE:  OUT   LOCATION:  SLEEP CENTER                 FACILITY:  [facility]   PHYSICIAN:  Clinton D. Maple Hudson, MD, FCCP, FACPDATE OF BIRTH:  1950/03/[birth year]   DATE OF STUDY:  04/05/2006                            NOCTURNAL POLYSOMNOGRAM   INDICATION FOR STUDY:  Hypersomnia with sleep apnea.   RESULTS:  Epward sleepiness score 17/24. BMI 41.6. Weight 244 pounds.   A baseline diagnostic NP SG on November 30, 2005 recorded an AHI of  106.6 per hour. CPAP titration is requested.   MEDICATIONS:  Are listed and reviewed.   SLEEP ARCHITECTURE:  Total sleep time 320 minutes with sleep efficiency  76%. Stage 1 was 7%; Stage 2, 84%; Stages 3 and 4, absent. REM 9% of  total sleep time. Sleep latency 40 minutes, REM latency 278 minutes.  Awake after sleep onset, 65 minutes. Arousal index 59.2, indicating  increased sleep fragmentation. No bedtime medication was taken.   RESPIRATORY DATA:  CPAP titration protocol:  CPAP was titrated to 15  CWP, AHI 2.3 per hour. A small Respironics Comfort Gel nasal mask was  used with chin strap and heated humidifier.   OXYGEN DATA:  Snoring was prevented and oxygen saturation held at 94% on  room air at final CPAP control.   CARDIAC DATA:  Sinus rhythm with occasional PVC.   MOVEMENT/PARASOMNIA:  Occasional limb jerk with little effect on sleep.   IMPRESSION/RECOMMENDATIONS:  1. Successful CPAP titration to 16 CWP, AHI 2.3 per hour. A small      Respironics Comfort Gel nasal mask was used with chin strap and      heated humidifier.  2. Diagnostic nocturnal polysomnogram on November 30, 2005 had      recorded an AHI of 106.6 per hour.      Clinton D. Maple Hudson, MD, [facility], FACP  Diplomate, Biomedical engineer of Sleep Medicine  Electronically Signed    CDY/MEDQ  D:  04/11/2006 09:19:46  T:  04/11/2006 18:00:42  Job:  [id number]

## 2010-07-25 NOTE — Op Note (Signed)
   Jocelyn Little, Jocelyn Little                       ACCOUNT NO.:  [id number]   MEDICAL RECORD NO.:  [id number]                   PATIENT TYPE:  AMB   LOCATION:  ENDO                                 FACILITY:  [facility]   PHYSICIAN:  James L. Malon Kindle., M.D.          DATE OF BIRTH:  Jul 14, 1950   DATE OF PROCEDURE:  10/12/2002  DATE OF DISCHARGE:                                 OPERATIVE REPORT   PROCEDURE:  Esophagogastroduodenoscopy and biopsy.   MEDICATIONS:  Cetacaine spray, Fentanyl 40 mcg and Versed 5 mg IV.   SURGEON:  James L. Randa Evens, M.D.   INDICATIONS FOR PROCEDURE:  Abdominal pain and burning.   DESCRIPTION OF PROCEDURE:  After the procedure had been explained to the  patient consent was obtained.  The patient was placed in the left lateral  decubitus position and the Olympus scope was inserted and advanced.  The  stomach was entered and the pylorus identified and passed.  There was fairly  marked duodenitis with some erosions and no frank ulceration.  The duodenal  bulb and the second duodenum was normal.  The pyloric channel and antrum  were normal.  Biopsies were taken for rapid urease testing for Helicobacter.  The fundus and cardia were seen well in the retroflex view and were normal.  There was a hiatal hernia with a 5 cm length with a widely patent GE  junction.  The esophagus was mucosally normal.  The scope was withdrawn.  The patient tolerated the procedure well.   ASSESSMENT:  1. Duodenitis.  2. Gastroesophageal reflux disease.   PLAN:  We will continue on the Nexium and go ahead with the gallbladder  ultrasound as planned.                                               James L. Malon Kindle., M.D.    Waldron Session  D:  10/12/2002  T:  10/12/2002  Job:  [id number]   cc:   Louanna Raw  [address]  [place]  [place] [address]  Fax: [phone]

## 2010-07-25 NOTE — Assessment & Plan Note (Signed)
[facility]                             PULMONARY OFFICE NOTE   ANYLA, ISRAELSON                    MRN:          [id number]  DATE:07/09/2006                            DOB:          26-May-1950    PULMONARY OFFICE FOLLOWUP   PROBLEM:  Obstructive sleep apnea.   HISTORY:  Home trial of CPAP at 16 CWP through [facility].  She says she sleeps well with it, except that she sneezes during the  night.  Dry mouth quite uncomfortable despite humidifier.  Feels better  without the CPAP mask.  When she wears it, she notices marked rhinorrhea  and post-nasal drip in the mornings.  She does have some history of  seasonal pollen allergic rhinitis any way and, in this weather, notices  a little tightness in her chest when she walks.  There has been no  bloody or purulent discharge.  No headache.   MEDICATIONS:  1. Toprol.  2. Nexium.  3. Lexapro.  4. CPAP at 16 CWP.   No medication allergy.   OBJECTIVE:  Weight 260 pounds, BP 126/78, pulse 70, room air saturation  97%.  Nasal airway looks dry and clear.  Pharynx is post palatoplasty.  Lung fields are clear.  There is no stridor.  No cough or wheeze.  Heart sounds are regular without murmur.  She seems alert in no distress  with no pressure marks on her face.   IMPRESSION:  Obstructive sleep apnea, still trying to adjust continuous  positive airway pressure for comfort.  Component probably a combination  of vasomotor rhinitis and allergic rhinitis.  Possibly minimal asthma.  I do not think she is describing exertional angina.   PLAN:  1. [facility] is going to reduce CPAP flow from 16 to 15      CWP, and work with her on humidifier adjustments.  2. Try sample of Nasacort AQ 2 sprays each nostril at bedtime.  3. Try sample of Astelin 1 spray each nostril p.r.n. on first waking      in the mornings.  4. Schedule return in 6 weeks, earlier p.r.n.  5. Important that she work  on weight loss as discussed.     Clinton D. Maple Hudson, MD, [facility], FACP  Electronically Signed    CDY/MedQ  DD: 07/09/2006  DT: 07/09/2006  Job #: [id number]   cc:   Newman Pies, MD

## 2010-07-25 NOTE — Assessment & Plan Note (Signed)
[facility]                             PULMONARY OFFICE NOTE   Jocelyn Little, Jocelyn Little                    MRN:          [id number]  DATE:06/04/2006                            DOB:          09-Jan-1951    PROBLEM:  60 year old woman seen on sleep medicine consultation at the  kind request of Dr. Suszanne Conners, concerned about sleep apnea.   HISTORY:  She had been diagnosed with sleep apnea 10 or 12 years ago.  Those studies are not available. She had been fitted with CPAP and  managed through a home care company in [facility] She has since moved  to this area, lost contact with the home care company, and stopped using  her CPAP. She does not know what the pressure was. The family has been  reporting loud snoring and she admits that she fights daytime  sleepiness, sometimes while driving. She had a palatoplasty and nasal  surgery. At that time, she lost weight and with those two modifications,  the family reported she stopped snoring for a while. Gradually, she  regained at least 30 pounds and snoring has returned. Bedtime is between  9pm and 11pm estimating 15-30 minutes sleep latency. She is up 3-6 times  during the night, usually very briefly, sometimes for the bathroom. She  is physically comfortable in bed. Final wake up is between 6:30am and  7am. A nocturnal polysomnogram at the [facility] on  11/30/05, recorded an AHI of 106.6 per hour. She returned on 04/05/2006  for CPAP titration to 16 CWP for an AHI of 2.3 per hour with markedly  improved oxygenation.   MEDICATIONS:  1. Toprol.  2. Nexium.  3. Lexapro.   REVIEW OF SYSTEMS:  Weight loss with throat surgery, now 30-40 pound  weight gain since October. Snoring and witnessed apneas, daytime  sleepiness, and non-restorative sleep. Depression. Seasonal rhinitis.   PAST HISTORY:  Obstructive sleep apnea. Remote tonsillectomy.  Palatopharyngeal plasty and nasal surgery in October.  Hypertension.  Allergic rhinitis. No history of heart, lung, or thyroid disease.   PAST SURGICAL HISTORY:  Additional procedures include cholecystectomy,  spine surgery, sinus surgery, and hysterectomy.   SOCIAL HISTORY:  Quit smoking in 1991. Quit alcohol. Divorced. She is  not working. Little caffeine.   FAMILY HISTORY:  Father snored some. Nobody diagnosed with sleep apnea.  Mother with heart disease. Father with rheumatism and cancer.   OBJECTIVE:  Weight 258 pounds, blood pressure 142/88, pulse 77, room air  saturation 97%. Pleasant, alert, obese woman. Vitiligo. No adenopathy.  HEENT:  Status post palatoplasty. Palette spacing 3/4. No strider.  Normal voice quality. Nasal airway is a little edematous with clear  mucoid secretions bilaterally, but not visible polyps or post nasal  drip. No periorbital edema. No conjunctival injection.  LUNGS:  Clear to PNA.  HEART:  Sounds regular without murmur or gallop.  EXTREMITIES:  There is no restlessness or tremor. No peripheral edema.   IMPRESSION:  Obstructive sleep apnea, recurrent with weight gain after  palatoplasty.   PLAN:  1. We discussed the physiology, available treatments, and  medical      concerns of sleep apnea.  2. Her responsibility to lose weight and to be alert and be safe while      driving were emphasized.  3. Treatment options were reviewed.  4. We are setting up a home trial of CPAP at 16 CWP and she will      return in one month for follow up.   I very much appreciate the chance to meet Jocelyn Little, and would be  happy to discuss her care.     Clinton D. Maple Hudson, MD, [facility], FACP  Electronically Signed    CDY/MedQ  DD: 06/04/2006  DT: 06/05/2006  Job #: [id number]   cc:   Newman Pies, MD

## 2010-07-25 NOTE — Op Note (Signed)
NAMESTARNISHA, BATREZ NO.:  [id number]   MEDICAL RECORD NO.:  [id number]                   PATIENT TYPE:  OBV   LOCATION:  0478                                 FACILITY:  [facility]   PHYSICIAN:  Lorre Munroe., M.D.            DATE OF BIRTH:  1950/03/29   DATE OF PROCEDURE:  10/24/2002  DATE OF DISCHARGE:  10/25/2002                                 OPERATIVE REPORT   PREOPERATIVE DIAGNOSES:  Acute and chronic cholecystitis and cholelithiasis.   POSTOPERATIVE DIAGNOSES:  Acute and chronic cholecystitis and  cholelithiasis.   OPERATION:  Laparoscopic cholecystectomy.   SURGEON:  Lebron Conners, M.D.   ASSISTANT:  Anselm Pancoast. Zachery Dakins, M.D.   ANESTHESIA:  General.   DESCRIPTION OF PROCEDURE:  After the patient was monitored and anesthetized  and had routine preparation and draping of the abdomen, I anesthetized the  area just below the umbilicus and made a short transverse incision, entered  the midline fascia and bluntly entered the peritoneal cavity. I placed a #0  Vicryl pursestring suture and secured a Hasson cannula. I then inflated the  abdomen with CO2 and examined the contents and found no abnormalities except  for acute an chronic inflammation of the gallbladder with adhesions to the  undersurface. After placing three additional ports, I took down the  adhesions and drained the bile from the gallbladder because it was tense. I  then grasped it, elevated it towards the right shoulder and dissected down  the inferior surface coming to the hepatoduodenal ligament. I then carefully  dissected in that area until I was able to clearly identify the cystic duct  emerging from the infundibulum of the gallbladder. I clipped it with four  clips and divided it between the two closest to the gallbladder. I then  searched for the cystic artery and clipped and divided it similarly. I then  pulled the gallbladder away from the liver and dissected using  cautery and  suction, blunt dissection and detached the gallbladder from the liver.  Hemostasis was obtained with the cautery and was good. I placed the  gallbladder in a plastic pouch and removed it through the umbilical  incision. I tied that pursestring suture and then copiously irrigated the  right upper quadrant again and removed the irrigant. Sponge, needle and  instrument counts were correct. Hemostasis was good. The clips were secure  and there was no evident leakage of bile. I then removed the two lateral  ports under direct vision and after allowing the CO2 to escape, I removed  the epigastric port. I closed all skin incisions with intracuticular 4-0  Vicryl and Steri-Strips. She tolerated the operation well.  Lorre Munroe., M.D.    WB/MEDQ  D:  11/18/2002  T:  11/18/2002  Job:  [id number]

## 2010-07-25 NOTE — Procedures (Signed)
[name], SANS NO.:  [id number]   MEDICAL RECORD NO.:  [id number]          PATIENT TYPE:  OUT   LOCATION:  SLEEP CENTER                 FACILITY:  [facility]   PHYSICIAN:  Clinton D. Maple Hudson, MD, FCCP, FACPDATE OF BIRTH:  20-Apr[birth year]1951   DATE OF STUDY:  11/30/2005                              NOCTURNAL POLYSOMNOGRAM   REFERRING PHYSICIAN:  Dr. Illene Silver   INDICATIONS FOR STUDY:  Hypersomnia with sleep apnea.   EPWORTH SLEEPINESS SCORE:  19/24.   BMI:  42.6.   WEIGHT:  250 pounds.   HOME MEDICATION:  Toprol, Lexapro, Nexium.   The patient says that she was not able to wear her CPAP in the past because  of nasal dryness and congestion.  A diagnostic NPSG protocol was requested.   SLEEP ARCHITECTURE:  Total sleep time 332 minutes with sleep efficiency 76%.  Stage I was 11%, stage II 86%, stages III and IV were absent, REM 4% of  total sleep time.  Sleep latency 12 minutes, REM latency 121 minutes, awake  after sleep onset 95 minutes, arousal index markedly increased at 88.2 and  indicating marked sleep fragmentation.  This corresponded to respiratory  events.   RESPIRATORY DATA:  Apnea/hypopnea index (AHI, RDI) 106.6 obstructive events  per hour indicating very severe obstructive sleep apnea/hypopnea syndrome.  There were 568 obstructive apneas and 23 hypopneas.  Events were not  positional.  REM AHI of 115 per hour.   OXYGEN DATA:  Moderate to loud snoring in all sleep positions with oxygen  desaturation to a nadir of 77%.  Mean oxygen saturation through the study  was 93% on room air.   CARDIAC DATA:  Normal sinus rhythm.   MOVEMENTS/PARASOMNIA:  No significant limb jerks or unusual movement.  Bathroom x2.   IMPRESSION/RECOMMENDATIONS:  1. Severe obstructive sleep apnea/hypopnea syndrome, AHI 106.6 per hour      with marked associated sleep fragmentation non positional events,      moderate to loud snoring and oxygen desaturation to 77%.  2. Consider  return for CPAP titration or alternative therapies as      appropriate.   NOTE:  The patient states she was unable to wear CPAP previously because of  nasal discomfort.      Clinton D. Maple Hudson, MD, FCCP, FACP  Diplomate, Biomedical engineer of Sleep Medicine  Electronically Signed     CDY/MEDQ  D:  12/06/2005 18:59:50  T:  12/07/2005 13:46:25  Job:  [id number]

## 2010-07-25 NOTE — Cardiovascular Report (Signed)
NAMEXAVIA, KNISKERN NO.:  [id number]   MEDICAL RECORD NO.:  [id number]                   PATIENT TYPE:  OIB   LOCATION:  2899                                 FACILITY:  [facility]   PHYSICIAN:  Nicki Guadalajara, M.D.                  DATE OF BIRTH:  1950-11-09   DATE OF PROCEDURE:  05/15/2002  DATE OF DISCHARGE:                              CARDIAC CATHETERIZATION   INDICATION:  The patient is a 60 year old African American female, who  initially was seen last summer with recurrent episodes of chest pain.  She  has a history of sleep apnea, on CPAP, history of GERD, and mild  hyperlipidemia.  A Cardiolite study last summer showed markedly aerobic  capacity with significant breast attenuation artifact with anterior thinning  without statistical stents for ischemia.  The patient has continued to  experience recurrent episodes of chest pain.  Because of recurrent  symptomatology, she is now referred for definitive diagnostic cardiac  catheterization.   DESCRIPTION OF PROCEDURE:  After premedication with Versed 2 mg  intravenously, the patient was prepped and draped in the usual fashion.  Her  right femoral artery was punctured anteriorly and a 6 French sheath was  inserted.  Diagnostic catheterization was done with a 6 French Judkins 4  left and right coronary catheters.  A 6 French pigtail catheter was used for  biplane cine left ventriculography as well as distal aortography.  Hemostasis was obtained by direct manual pressure.  The patient tolerated  the procedure well.   HEMODYNAMIC DATA:  Central aortic pressure was 155/77, mean 111. Left  ventricular pressure was 155/11, post A wave 23.   ANGIOGRAPHIC DATA:  1. Left main coronary artery:  The left main coronary artery was     angiographically normal and bifurcated into an LAD and left circumflex     system.  2. Left anterior descending:  The LAD was a moderate sized vessel.  It     wrapped around  the AV apex.  The vessel gave rise to one large proximal     diagonal vessel.  The midportion of the LAD seemed to dip     intramyocardially.  There was no obstructive disease or evidence for     spasm.  3. Circumflex artery:  The circumflex vessel was angiographically normal,     gave rise to one major bifurcating marginal vessel.  4. Right coronary artery:  The right coronary artery was angiographically     normal, gave rise to the posterior descending artery, and ended in a     small posterolateral system.   Biplane cine left ventriculography revealed normal LV function.   DISTAL AORTOGRAPHY:  Distal aortography did not demonstrate any significant  aortoiliac disease.  There was smooth 10-20% ostial narrowing of the right  renal artery.    IMPRESSION:  1. Normal left ventricular function.  2. Essentially normal coronary arteries with  portion of the mid left     anterior descending dipping intramyocardially without evidence for     obstructive disease.                                                  Nicki Guadalajara, M.D.    TK/MEDQ  D:  05/15/2002  T:  05/15/2002  Job:  [id number]   cc:   Cardiac Catheterization Laboratory   Van Clines, M.D.  [facility]

## 2011-04-10 ENCOUNTER — Encounter: Payer: Self-pay | Admitting: Internal Medicine

## 2011-04-13 ENCOUNTER — Ambulatory Visit: Payer: Self-pay | Admitting: Internal Medicine

## 2011-04-22 ENCOUNTER — Ambulatory Visit: Payer: Self-pay | Admitting: Internal Medicine

## 2011-05-15 ENCOUNTER — Ambulatory Visit (INDEPENDENT_AMBULATORY_CARE_PROVIDER_SITE_OTHER): Payer: Medicaid Other | Admitting: Internal Medicine

## 2011-05-15 ENCOUNTER — Encounter: Payer: Self-pay | Admitting: Internal Medicine

## 2011-05-15 VITALS — BP 120/70 | HR 66 | Ht 64.0 in | Wt 267.8 lb

## 2011-05-15 DIAGNOSIS — J209 Acute bronchitis, unspecified: Secondary | ICD-10-CM

## 2011-05-15 DIAGNOSIS — G4733 Obstructive sleep apnea (adult) (pediatric): Secondary | ICD-10-CM

## 2011-05-15 DIAGNOSIS — J310 Chronic rhinitis: Secondary | ICD-10-CM

## 2011-05-15 MED ORDER — ALBUTEROL SULFATE HFA 108 (90 BASE) MCG/ACT IN AERS: 2.0000 | INHALATION_SPRAY | Freq: Four times a day (QID) | RESPIRATORY_TRACT | Status: DC | PRN

## 2011-05-15 NOTE — Progress Notes (Signed)
05/15/11 60 yoF former smoker followed for OSA/ UPPP/CPAP, allergic rhinitis, complicated by HBP, GERD LOV-05/16/09 daughter here Continues CPAP from [facility] but wants to change home care companies because of service. Using a nasal mask and humidifier. Able to use it all night every night routinely. Describes perennial rhinitis with nasal congestion, sneezing, drainage. Prescription nasal sprays in the past have helped only briefly. Chest has been comfortably controlled with a little wheeze during the winter only, possibly with a cold.  ROS-see HPI Constitutional:   No-   weight loss, night sweats, fevers, chills, fatigue, lassitude. HEENT:   No-  headaches, difficulty swallowing, tooth/dental problems, sore throat,       +  sneezing, itching, ear ache, nasal congestion, post nasal drip,  CV:  No-   chest pain, orthopnea, PND, swelling in lower extremities, anasarca, dizziness, palpitations Resp: No-   shortness of breath with exertion or at rest.              No-   productive cough,  No non-productive cough,  No- coughing up of blood.              No-   change in color of mucus.  No- wheezing.   Skin: No-   rash or lesions. GI:  No-   heartburn, indigestion, abdominal pain, nausea, vomiting, GU: No-   dysuria,  MS:  No-   joint pain or swelling.  No- decreased range of motion.  No- back pain. Neuro-     nothing unusual Psych:  No- change in mood or affect. No depression or anxiety.  No memory loss.  OBJ- Physical Exam General- Alert, Oriented, Affect-appropriate, Distress- none acute, overweight Skin- diffuse spotting- nevi or vitelligo Lymphadenopathy- none Head- atraumatic            Eyes- Gross vision intact, PERRLA, conjunctivae and secretions clear            Ears- Hearing, canals-normal            Nose- Clear, no-Septal dev,  +mucus, polyps, erosion, perforation             Throat- Mallampati III- s/p UPPP , mucosa clear , drainage- none, tonsils- atrophic Neck-  flexible , trachea midline, no stridor , thyroid nl, carotid no bruit Chest - symmetrical excursion , unlabored           Heart/CV- RRR , no murmur , no gallop  , no rub, nl s1 s2                           - JVD- none , edema- none, stasis changes- none, varices- none           Lung- clear to P&A, wheeze- none, cough with deep breath , dullness-none, rub- none           Chest wall-  Abd-  Br/ Gen/ Rectal- Not done, not indicated Extrem- cyanosis- none, clubbing, none, atrophy- none, strength- nl Neuro- grossly intact to observation

## 2011-05-15 NOTE — Patient Instructions (Addendum)
Script for "rescue" inhaler -  Use 2 puffs, up to 4 times daily if needed  Suggest you try an otc antihistamine like Claritin/ loratadine or Allegra/ fexofenadine for your nose  Sample for trial- Dymista nasal spray-  1 spray each nostril, once or twice daily  Order- [facility]- help re-establish with a new DME for CPAP support. Nothing needed right now.   Dx OSA

## 2011-05-18 NOTE — Assessment & Plan Note (Signed)
Plan-she will meet with [facility] about changing DME companies.

## 2011-05-18 NOTE — Assessment & Plan Note (Signed)
Rescue inhaler to hold.

## 2011-05-18 NOTE — Assessment & Plan Note (Addendum)
Perennial and seasonal allergic rhinitis Plan-loratadine, sample Dymista

## 2011-05-26 ENCOUNTER — Other Ambulatory Visit: Payer: Self-pay | Admitting: Family Medicine

## 2011-05-26 ENCOUNTER — Other Ambulatory Visit (HOSPITAL_COMMUNITY)
Admission: RE | Admit: 2011-05-26 | Discharge: 2011-05-26 | Disposition: A | Discharge status: Home / Self Care | Payer: Medicare Other | Source: Ambulatory Visit | Attending: Family Medicine | Admitting: Family Medicine

## 2011-05-26 DIAGNOSIS — Z1159 Encounter for screening for other viral diseases: Secondary | ICD-10-CM | POA: Insufficient documentation

## 2011-05-26 DIAGNOSIS — Z01419 Encounter for gynecological examination (general) (routine) without abnormal findings: Secondary | ICD-10-CM | POA: Insufficient documentation

## 2011-06-03 ENCOUNTER — Emergency Department (INDEPENDENT_AMBULATORY_CARE_PROVIDER_SITE_OTHER): Payer: Medicaid Other

## 2011-06-03 ENCOUNTER — Encounter (HOSPITAL_COMMUNITY): Payer: Self-pay | Admitting: *Deleted

## 2011-06-03 ENCOUNTER — Emergency Department (INDEPENDENT_AMBULATORY_CARE_PROVIDER_SITE_OTHER)
Admission: EM | Admit: 2011-06-03 | Discharge: 2011-06-03 | Disposition: A | Discharge status: Home / Self Care | Payer: Medicaid Other | Source: Home / Self Care | Attending: Emergency Medicine | Admitting: Emergency Medicine

## 2011-06-03 ENCOUNTER — Telehealth (HOSPITAL_COMMUNITY): Payer: Self-pay | Admitting: *Deleted

## 2011-06-03 DIAGNOSIS — J40 Bronchitis, not specified as acute or chronic: Secondary | ICD-10-CM

## 2011-06-03 HISTORY — DX: Pure hypercholesterolemia, unspecified: E78.00

## 2011-06-03 MED ORDER — ALBUTEROL SULFATE HFA 108 (90 BASE) MCG/ACT IN AERS
INHALATION_SPRAY | RESPIRATORY_TRACT | Status: AC
  Filled 2011-06-03: qty 6.7

## 2011-06-03 MED ORDER — ALBUTEROL SULFATE (5 MG/ML) 0.5% IN NEBU
5.0000 mg | INHALATION_SOLUTION | Freq: Once | RESPIRATORY_TRACT | Status: AC
  Administered 2011-06-03: 5 mg via RESPIRATORY_TRACT

## 2011-06-03 MED ORDER — PREDNISONE 20 MG PO TABS
ORAL_TABLET | ORAL | Status: AC
  Filled 2011-06-03: qty 3

## 2011-06-03 MED ORDER — GUAIFENESIN ER 600 MG PO TB12: 600.0000 mg | ORAL_TABLET | Freq: Two times a day (BID) | ORAL | Status: DC

## 2011-06-03 MED ORDER — AZITHROMYCIN 250 MG PO TABS: 250.0000 mg | ORAL_TABLET | Freq: Every day | ORAL | Status: AC

## 2011-06-03 MED ORDER — PREDNISONE 20 MG PO TABS
60.0000 mg | ORAL_TABLET | Freq: Once | ORAL | Status: AC
  Administered 2011-06-03: 60 mg via ORAL

## 2011-06-03 MED ORDER — ALBUTEROL SULFATE (5 MG/ML) 0.5% IN NEBU
INHALATION_SOLUTION | RESPIRATORY_TRACT | Status: AC
  Filled 2011-06-03: qty 0.5

## 2011-06-03 MED ORDER — ALBUTEROL SULFATE HFA 108 (90 BASE) MCG/ACT IN AERS: 2.0000 | INHALATION_SPRAY | RESPIRATORY_TRACT | Status: DC

## 2011-06-03 MED ORDER — ALBUTEROL SULFATE (5 MG/ML) 0.5% IN NEBU
INHALATION_SOLUTION | RESPIRATORY_TRACT | Status: AC
  Filled 2011-06-03: qty 1

## 2011-06-03 MED ORDER — GUAIFENESIN-CODEINE 100-10 MG/5ML PO SYRP: 10.0000 mL | ORAL_SOLUTION | Freq: Four times a day (QID) | ORAL | Status: AC | PRN

## 2011-06-03 MED ORDER — DEXAMETHASONE 4 MG PO TABS: ORAL_TABLET | ORAL | Status: AC

## 2011-06-03 MED ORDER — IPRATROPIUM BROMIDE 0.02 % IN SOLN
0.5000 mg | Freq: Once | RESPIRATORY_TRACT | Status: AC
  Administered 2011-06-03: 0.5 mg via RESPIRATORY_TRACT

## 2011-06-03 NOTE — ED Notes (Signed)
Pt. called and said she was confused about what medicine she is supposed to start today and what she is supposed to start tomorrow. I asked what were the medicines. She said Dexamethasone and Azithromycin. She said she was given 1 dose of Prednisone here. I told her she is to start the Azithromycin today and the Dexamethasone tomorrow because that is the steroid. Pt. asked what she is supposed to do about her back pain. I told her she can take Tylenol if needed. Jocelyn Little 06/03/2011

## 2011-06-03 NOTE — ED Notes (Signed)
Peak flow pre treatment 350/350/270    Post treatment 250/400/400

## 2011-06-03 NOTE — ED Notes (Signed)
Breathing easier 

## 2011-06-03 NOTE — ED Provider Notes (Signed)
History     CSN: [id number]  Arrival date & time 06/03/11  9528   First MD Initiated Contact with Patient 06/03/11 1015      Chief Complaint  Patient presents with  . Cough  . Back Pain  . Nasal Congestion    (Consider location/radiation/quality/duration/timing/severity/associated sxs/prior treatment) HPI Comments: Patient reports nonproductive cough, chest soreness, mid upper back pain  for the past 33 weeks. Symptoms started after having an upper respiratory illness. Patient states that her chest and back soreness is worse with coughing, taking deep breath in, bending or twisting toward. Reports some wheezing, shortness of breath. patient is history seasonal allergies, and is on inhaled steroids and albuterol as needed. Patient has not been using her albuterol.   Patient is a 61 y.o. female presenting with cough. The history is provided by the patient. No language interpreter was used.  Cough The current episode started more than 1 week ago. The problem occurs constantly. The problem has been gradually worsening. The cough is non-productive. There has been no fever. Associated symptoms include chest pain, myalgias, shortness of breath and wheezing. Pertinent negatives include no chills, no sweats, no weight loss, no rhinorrhea and no sore throat. She has tried cough syrup for the symptoms. The treatment provided no relief. Smoker: Former smoker. Her past medical history does not include bronchitis, pneumonia, COPD, emphysema or asthma.    Past Medical History  Diagnosis Date  . Rhinitis   . OSA (obstructive sleep apnea)   . GERD (gastroesophageal reflux disease)   . HTN (hypertension)   . High cholesterol     Past Surgical History  Procedure Date  . Uvulopalatopharyngoplasty   . Cholecystectomy   . Total abdominal hysterectomy   . Lipoma excision     Family History  Problem Relation Age of Onset  . Emphysema Father   . Asthma Brother   . Heart disease Mother   . Heart  disease Brother   . Rheum arthritis Father   . Rheum arthritis Brother   . Lung cancer Father   . Diabetes Brother     History  Substance Use Topics  . Smoking status: Former Smoker -- 0.8 packs/day for 20 years    Types: Cigarettes    Quit date: 03/10/1991  . Smokeless tobacco: Not on file  . Alcohol Use: No    OB History    Grav Para Term Preterm Abortions TAB SAB Ect Mult Living                  Review of Systems  Constitutional: Negative for chills and weight loss.  HENT: Negative for sore throat and rhinorrhea.   Respiratory: Positive for cough, shortness of breath and wheezing.   Cardiovascular: Positive for chest pain.  Musculoskeletal: Positive for myalgias.    Allergies  Naproxen  Home Medications   Current Outpatient Rx  Name Route Sig Dispense Refill  . ALBUTEROL SULFATE HFA 108 (90 BASE) MCG/ACT IN AERS Inhalation Inhale 2 puffs into the lungs every 6 (six) hours as needed for wheezing or shortness of breath. 1 Inhaler prn  . ASPIRIN 81 MG PO TABS Oral Take 81 mg by mouth daily.    . BUDESONIDE-FORMOTEROL FUMARATE 160-4.5 MCG/ACT IN AERO Inhalation Inhale 2 puffs into the lungs 2 (two) times daily.    Marland Kitchen ESCITALOPRAM OXALATE 10 MG PO TABS Oral Take 10 mg by mouth daily.    Marland Kitchen ESOMEPRAZOLE MAGNESIUM 40 MG PO CPDR Oral Take 40 mg by mouth daily  before breakfast.    . METOPROLOL TARTRATE 25 MG PO TABS Oral Take 25 mg by mouth 2 (two) times daily.    Marland Kitchen ONE-DAILY MULTI VITAMINS PO TABS Oral Take 1 tablet by mouth daily.    Marland Kitchen FISH OIL 1000 MG PO CAPS Oral Take 1 capsule by mouth 2 (two) times daily.    Marland Kitchen PRESCRIPTION MEDICATION  Cholesterol medication    . AZELASTINE HCL 137 MCG/SPRAY NA SOLN Nasal Place 1 spray into the nose 2 (two) times daily. Use in each nostril as directed    . AZITHROMYCIN 250 MG PO TABS Oral Take 1 tablet (250 mg total) by mouth daily. 2 tabs po on day one, then one tablet po once daily on days 2-5. 6 tablet 0  . DEXAMETHASONE 4 MG PO TABS   4 tabs po at once on day one, 4 tabs po at once on day 2 8 tablet 0  . GUAIFENESIN ER 600 MG PO TB12 Oral Take 1 tablet (600 mg total) by mouth 2 (two) times daily. 14 tablet 0  . GUAIFENESIN-CODEINE 100-10 MG/5ML PO SYRP Oral Take 10 mLs by mouth 4 (four) times daily as needed for cough or congestion (Every 4-6 hours as needed). 120 mL 0    BP 148/82  Pulse 70  Temp(Src) 97.5 F (36.4 C) (Oral)  Resp 12  SpO2 95%  Physical Exam  Nursing note and vitals reviewed. Constitutional: She is oriented to person, place, and time. She appears well-developed and well-nourished.  HENT:  Head: Normocephalic and atraumatic.  Eyes: Conjunctivae and EOM are normal.  Neck: Normal range of motion.  Cardiovascular: Normal rate, regular rhythm, normal heart sounds and intact distal pulses.   No murmur heard. Pulmonary/Chest: Effort normal and breath sounds normal. No respiratory distress. She exhibits tenderness.       Wheezing bilaterally. Rales left side.  Abdominal: Soft. Bowel sounds are normal. She exhibits no distension. There is no tenderness.  Musculoskeletal: Normal range of motion. She exhibits no edema and no tenderness.  Neurological: She is alert and oriented to person, place, and time.  Skin: Skin is warm and dry.  Psychiatric: She has a normal mood and affect. Her behavior is normal. Judgment and thought content normal.    ED Course  Procedures (including critical care time)  Labs Reviewed - No data to display Dg Chest 2 View  06/03/2011  *RADIOLOGY REPORT*  Clinical Data: Wheezing, shortness of breath and productive cough.  CHEST - 2 VIEW  Comparison: 03/09/2006.  Findings: Trachea is midline.  Heart size normal.  Lungs are clear. No pleural fluid.  IMPRESSION: No acute findings.  Original Report Authenticated By: Reyes Ivan, M.D.     1. Bronchitis     X-ray reviewed by myself. No pneumonia. See radiologist report for full details  MDM  Pt seen and examined. Pt  speaking in full sentences but has wheezing, rales L side, Pt given albuterol/atrovent, predinsone. Will tx with ABX even if cxr neg as pt has had sx for 2-3 weeks.  Peak flow done.  pretx 350/350/270.  calculated peak flow 447. Will do CXR because of focal lung findings Will re-evaluate.    Posttx 400/400. Improved air movement, decreased wheezing on repeat exam. Patient states she feels significantly better. Discussed imaging findings with her. Sending home with azithromycin, and  albuterol, short course of steroids, Mucinex, cough syrup at night. Will have her continue her support as written, and have her followup with Dr. Parke Simmers in  several days.  Luiz Blare, MD 06/03/11 1248

## 2011-06-03 NOTE — ED Notes (Signed)
Pt with c/o cough and mid to upper back pain - onset of cough congestion x 2 - 3 weeks - onset of back pain x one week - worse when coughing and movement - productive cough whitish sputum

## 2011-06-03 NOTE — Discharge Instructions (Signed)
Take two puffs from your albuterol inhaler every 4 hours. Finish the steroids unless your doctor tells you to stop. Finish the antibiotics, even if you feel better. Do a peak flow, once the morning and once at night. Write this down. The number should be going up, not down. You may decrease the frequency of your albuterol inhaler as the numbers go up and you start feeling better. You may start the steroids tomorrow, as you have already taken today's dose. You may take tylenol 1 gram up to 4 times a day as needed for pain. This with 600 mg of motrin is an effective combination for pain and fever. Make sure you drink extra fluids. Return if you get worse, have a fever >100.4, or any other concerns.  ° °Go to www.goodrx.com to look up your medications. This will give you a list of where you can find your prescriptions at the most affordable prices.  °

## 2011-06-30 ENCOUNTER — Other Ambulatory Visit (HOSPITAL_COMMUNITY): Payer: Self-pay | Admitting: Family Medicine

## 2011-06-30 DIAGNOSIS — Z1231 Encounter for screening mammogram for malignant neoplasm of breast: Secondary | ICD-10-CM

## 2011-07-24 ENCOUNTER — Ambulatory Visit (HOSPITAL_COMMUNITY)
Admission: RE | Admit: 2011-07-24 | Discharge: 2011-07-24 | Disposition: A | Discharge status: Home / Self Care | Payer: Medicaid Other | Source: Ambulatory Visit | Attending: Family Medicine | Admitting: Family Medicine

## 2011-07-24 DIAGNOSIS — Z1231 Encounter for screening mammogram for malignant neoplasm of breast: Secondary | ICD-10-CM | POA: Insufficient documentation

## 2011-10-27 ENCOUNTER — Telehealth: Payer: Self-pay | Admitting: Internal Medicine

## 2011-10-27 NOTE — Telephone Encounter (Signed)
I spoke with pt and she stated she never heard back from any DME after her visit from 05/15/11. Looked in pt chart order was sent to [facility]. I called [facility] and spoke with [place] and was advised they did speak with pt in March and she did not need anything at that time. She stated pt was suppose to call them when she needed supplies, etc. Per Mitzi Davenport pt needs to call them and advised them what she needs so they can get this set up. [phone]) [phone].  lmomtcb x1 for pt to make her aware of this

## 2011-10-27 NOTE — Telephone Encounter (Signed)
Per OV note 05/15/11: Order- [facility]- help re-establish with a new DME for CPAP support. Nothing needed right now. Dx OSA   lmomtcb x1 for pt

## 2011-10-28 NOTE — Telephone Encounter (Signed)
[email or web address].Karter Haire, CMA  

## 2011-10-29 NOTE — Telephone Encounter (Signed)
Spoke with pt and informed her that we spoke with [place] at [facility] and that they were waiting for pt to call and let then know when she needed supplies.  Pt given [facility] number and she will contact them for her supplies.

## 2011-12-07 ENCOUNTER — Encounter (HOSPITAL_COMMUNITY): Payer: Self-pay | Admitting: Family Medicine

## 2011-12-07 ENCOUNTER — Emergency Department (HOSPITAL_COMMUNITY)
Admission: EM | Admit: 2011-12-07 | Discharge: 2011-12-07 | Disposition: A | Discharge status: Home / Self Care | Payer: Medicare Other | Attending: Emergency Medicine | Admitting: Emergency Medicine

## 2011-12-07 ENCOUNTER — Emergency Department (HOSPITAL_COMMUNITY): Payer: Medicare Other

## 2011-12-07 DIAGNOSIS — Z801 Family history of malignant neoplasm of trachea, bronchus and lung: Secondary | ICD-10-CM | POA: Insufficient documentation

## 2011-12-07 DIAGNOSIS — G4733 Obstructive sleep apnea (adult) (pediatric): Secondary | ICD-10-CM | POA: Insufficient documentation

## 2011-12-07 DIAGNOSIS — Z888 Allergy status to other drugs, medicaments and biological substances status: Secondary | ICD-10-CM | POA: Insufficient documentation

## 2011-12-07 DIAGNOSIS — Z87891 Personal history of nicotine dependence: Secondary | ICD-10-CM | POA: Insufficient documentation

## 2011-12-07 DIAGNOSIS — Z833 Family history of diabetes mellitus: Secondary | ICD-10-CM | POA: Insufficient documentation

## 2011-12-07 DIAGNOSIS — Z8249 Family history of ischemic heart disease and other diseases of the circulatory system: Secondary | ICD-10-CM | POA: Insufficient documentation

## 2011-12-07 DIAGNOSIS — Z825 Family history of asthma and other chronic lower respiratory diseases: Secondary | ICD-10-CM | POA: Insufficient documentation

## 2011-12-07 DIAGNOSIS — Z8489 Family history of other specified conditions: Secondary | ICD-10-CM | POA: Insufficient documentation

## 2011-12-07 DIAGNOSIS — M436 Torticollis: Secondary | ICD-10-CM

## 2011-12-07 DIAGNOSIS — IMO0002 Reserved for concepts with insufficient information to code with codable children: Secondary | ICD-10-CM

## 2011-12-07 DIAGNOSIS — I10 Essential (primary) hypertension: Secondary | ICD-10-CM | POA: Insufficient documentation

## 2011-12-07 MED ORDER — DIAZEPAM 5 MG PO TABS
5.0000 mg | ORAL_TABLET | Freq: Once | ORAL | Status: AC
  Administered 2011-12-07: 5 mg via ORAL
  Filled 2011-12-07: qty 1

## 2011-12-07 MED ORDER — DIAZEPAM 5 MG PO TABS: 5.0000 mg | ORAL_TABLET | Freq: Four times a day (QID) | ORAL | Status: DC | PRN

## 2011-12-07 MED ORDER — HYDROCODONE-ACETAMINOPHEN 5-325 MG PO TABS
1.0000 | ORAL_TABLET | Freq: Once | ORAL | Status: AC
  Administered 2011-12-07: 1 via ORAL
  Filled 2011-12-07: qty 1

## 2011-12-07 MED ORDER — HYDROCODONE-ACETAMINOPHEN 5-500 MG PO TABS: 1.0000 | ORAL_TABLET | Freq: Four times a day (QID) | ORAL | Status: DC | PRN

## 2011-12-07 NOTE — ED Provider Notes (Signed)
History     CSN: [id number]  Arrival date & time 12/07/11  1025   First MD Initiated Contact with Patient 12/07/11 1115      Chief Complaint  Patient presents with  . Headache    (Consider location/radiation/quality/duration/timing/severity/associated sxs/prior treatment) HPI Comments: Jocelyn Little is a 61 y.o. Female who presents with complaint of neck pain. States she woke up this morning and when tried to turn to the other side, felt pain in right side of the neck. States since then pain continues. States pain worsened with movement of the head. Stats pain radiating up into the right side of the head. Denies visual changes. Denies numbness or weakness in hands or feet. Denies nausea, vomiting. Also complaining of  Pressure over right sinuses, but states she has chronic congestion due to allergies. No injuries.   The history is provided by the patient.    Past Medical History  Diagnosis Date  . Rhinitis   . OSA (obstructive sleep apnea)   . GERD (gastroesophageal reflux disease)   . HTN (hypertension)   . High cholesterol     Past Surgical History  Procedure Date  . Uvulopalatopharyngoplasty   . Cholecystectomy   . Total abdominal hysterectomy   . Lipoma excision     Family History  Problem Relation Age of Onset  . Emphysema Father   . Asthma Brother   . Heart disease Mother   . Heart disease Brother   . Rheum arthritis Father   . Rheum arthritis Brother   . Lung cancer Father   . Diabetes Brother     History  Substance Use Topics  . Smoking status: Former Smoker -- 0.8 packs/day for 20 years    Types: Cigarettes    Quit date: 03/10/1991  . Smokeless tobacco: Not on file  . Alcohol Use: No    OB History    Grav Para Term Preterm Abortions TAB SAB Ect Mult Living                  Review of Systems  Constitutional: Negative for fever and chills.  HENT: Positive for ear pain, congestion, neck pain and sinus pressure. Negative for sore throat and  neck stiffness.   Eyes: Negative for photophobia, pain and visual disturbance.  Respiratory: Negative.   Cardiovascular: Negative.   Gastrointestinal: Negative.   Genitourinary: Negative for dysuria.  Skin: Negative.   Neurological: Positive for headaches. Negative for dizziness, weakness, light-headedness and numbness.  Hematological: Negative.     Allergies  Naproxen  Home Medications   Current Outpatient Rx  Name Route Sig Dispense Refill  . ALBUTEROL SULFATE HFA 108 (90 BASE) MCG/ACT IN AERS Inhalation Inhale 2 puffs into the lungs every 6 (six) hours as needed. Shortness of breath    . ASPIRIN 81 MG PO TABS Oral Take 81 mg by mouth daily.    Marland Kitchen ESCITALOPRAM OXALATE 10 MG PO TABS Oral Take 10 mg by mouth daily.    Marland Kitchen ESOMEPRAZOLE MAGNESIUM 40 MG PO CPDR Oral Take 40 mg by mouth daily before breakfast.    . METOPROLOL TARTRATE 25 MG PO TABS Oral Take 25 mg by mouth 2 (two) times daily.    Marland Kitchen ONE-DAILY MULTI VITAMINS PO TABS Oral Take 1 tablet by mouth daily.    Marland Kitchen FISH OIL 1000 MG PO CAPS Oral Take 1 capsule by mouth 2 (two) times daily.      BP 150/57  Pulse 73  Temp 98.2 F (36.8 C) (Oral)  Resp 16  SpO2 97%  Physical Exam  Nursing note and vitals reviewed. Constitutional: She is oriented to person, place, and time. She appears well-developed and well-nourished.  Neck: Normal range of motion. Neck supple.       TTP over right trapezius and sternocleidomastoid muscle. Pain with turning head to the left , back, and front. No masses, no pulsatile swelling, no bruising.  Cardiovascular: Normal rate, regular rhythm and normal heart sounds.   Pulmonary/Chest: Effort normal and breath sounds normal. No respiratory distress. She has no wheezes. She has no rales.  Abdominal: Soft. Bowel sounds are normal. She exhibits no distension. There is no tenderness.  Musculoskeletal: Normal range of motion.  Neurological: She is alert and oriented to person, place, and time.       5/5 and  equal upper and lower extremity strength. Normal finger to nose, heel to shin, no pronator drift.    ED Course  Procedures (including critical care time)  Pt with right sided neck pain, reprodible with palpation and turning p's head. She is not having any changes in vision, no weakness or numbness in extremity, no difficulty walking. Will get x-ray. Suspect muscle spasms vs arthritis.   No results found for this or any previous visit. Dg Cervical Spine Complete  12/07/2011  *RADIOLOGY REPORT*  Clinical Data: Right-sided neck pain, no known injury  CERVICAL SPINE - COMPLETE 4+ VIEW  Comparison: Chest radiograph - 06/03/2011  Findings:  C1 to the superior endplate of C7 is visualized on the lateral radiograph.  There is suboptimal evaluation of the cervical thoracic junction secondary to overlying osseous and soft tissue structures.  There is mild straightening of the expected cervical lordosis.  No definite anterolisthesis or retrolisthesis.  The dens is normally positioned between the lateral masses of C1.  The bilateral facets are normally aligned.  Cervical vertebral body heights are preserved.  Prevertebral soft tissues are normal.  There is minimal osteophytosis about the anterior endplates about the C4 - C5, C5 - C6 and C6 - C7 intervertebral disc levels with preservation of disc space heights.  Mild multilevel neural foraminal narrowing is suspected on the right, though possibly accentuated due to obliquity.  No definite left-sided neural foraminal narrowing.  Partial calcification of the nuchal ligament.  Bilateral cervical ribs 67 are suspected.  Regional soft tissues are normal.  IMPRESSION: 1.  Mild multilevel right-sided neural foraminal narrowing is suspected, though possibly accentuated due to obliquity. 2.  Minimal multilevel anteriorly directed osteophytosis with preservation of disc spaces. 3. Incidental note is made of bilateral cervical ribs.   Original Report Authenticated By: Waynard Reeds, M.D.     12:44 PM Pt feeing better with valium and vicodin in ED. I have low suspicion for any vascular abnormality given pt has no symptoms of a CVA or dissection. Plan to d/c home with close follow up for recheck. Muscle relaxant, pain meds at home.   1. Torticollis   2. Degenerative disk disease       MDM          Lottie Mussel, PA 12/07/11 1255

## 2011-12-07 NOTE — ED Notes (Addendum)
Tatyana, PA at bedside. 

## 2011-12-07 NOTE — ED Notes (Signed)
Pt sts she woke up this am to right sided neck pain radiating into the top of her head. Denies N,V. sts some blurry vision.

## 2011-12-07 NOTE — ED Provider Notes (Signed)
Medical screening examination/treatment/procedure(s) were performed by non-physician practitioner and as supervising physician I was immediately available for consultation/collaboration.   Celene Kras, MD 12/07/11 [phone]

## 2011-12-09 ENCOUNTER — Encounter (HOSPITAL_COMMUNITY): Payer: Self-pay | Admitting: *Deleted

## 2011-12-09 ENCOUNTER — Emergency Department (HOSPITAL_COMMUNITY)
Admission: EM | Admit: 2011-12-09 | Discharge: 2011-12-09 | Disposition: A | Discharge status: Home / Self Care | Payer: Medicaid Other | Source: Home / Self Care | Attending: Emergency Medicine | Admitting: Emergency Medicine

## 2011-12-09 DIAGNOSIS — L600 Ingrowing nail: Secondary | ICD-10-CM

## 2011-12-09 MED ORDER — CEPHALEXIN 500 MG PO CAPS: 500.0000 mg | ORAL_CAPSULE | Freq: Two times a day (BID) | ORAL | Status: DC

## 2011-12-09 MED ORDER — TRAMADOL HCL 50 MG PO TABS: 50.0000 mg | ORAL_TABLET | Freq: Four times a day (QID) | ORAL | Status: DC | PRN

## 2011-12-09 NOTE — ED Provider Notes (Signed)
History     CSN: [id number]  Arrival date & time 12/09/11  1454   First MD Initiated Contact with Patient 12/09/11 1714      Chief Complaint  Patient presents with  . Ingrown Toenail    (Consider location/radiation/quality/duration/timing/severity/associated sxs/prior treatment) Patient is a 61 y.o. female presenting with toe pain. The history is provided by the patient.  Toe Pain This is a recurrent problem. The current episode started more than 1 week ago. The problem occurs daily. The problem has not changed since onset.Nothing aggravates the symptoms. Relieved by: trimming toenail.    Past Medical History  Diagnosis Date  . Rhinitis   . OSA (obstructive sleep apnea)   . GERD (gastroesophageal reflux disease)   . HTN (hypertension)   . High cholesterol     Past Surgical History  Procedure Date  . Uvulopalatopharyngoplasty   . Cholecystectomy   . Total abdominal hysterectomy   . Lipoma excision     Family History  Problem Relation Age of Onset  . Emphysema Father   . Asthma Brother   . Heart disease Mother   . Heart disease Brother   . Rheum arthritis Father   . Rheum arthritis Brother   . Lung cancer Father   . Diabetes Brother     History  Substance Use Topics  . Smoking status: Former Smoker -- 0.8 packs/day for 20 years    Types: Cigarettes    Quit date: 03/10/1991  . Smokeless tobacco: Not on file  . Alcohol Use: No    OB History    Grav Para Term Preterm Abortions TAB SAB Ect Mult Living                  Review of Systems  Constitutional: Negative.   Respiratory: Negative.   Cardiovascular: Negative.   Musculoskeletal: Positive for arthralgias. Negative for myalgias, back pain, joint swelling and gait problem.  Skin: Negative.     Allergies  Naproxen  Home Medications   Current Outpatient Rx  Name Route Sig Dispense Refill  . ALBUTEROL SULFATE HFA 108 (90 BASE) MCG/ACT IN AERS Inhalation Inhale 2 puffs into the lungs every 6 (six)  hours as needed. Shortness of breath    . ASPIRIN 81 MG PO TABS Oral Take 81 mg by mouth daily.    . CEPHALEXIN 500 MG PO CAPS Oral Take 1 capsule (500 mg total) by mouth 2 (two) times daily. 20 capsule 0  . DIAZEPAM 5 MG PO TABS Oral Take 1 tablet (5 mg total) by mouth every 6 (six) hours as needed (for muscle spasms). 12 tablet 0  . ESCITALOPRAM OXALATE 10 MG PO TABS Oral Take 10 mg by mouth daily.    Marland Kitchen ESOMEPRAZOLE MAGNESIUM 40 MG PO CPDR Oral Take 40 mg by mouth daily before breakfast.    . HYDROCODONE-ACETAMINOPHEN 5-500 MG PO TABS Oral Take 1-2 tablets by mouth every 6 (six) hours as needed for pain. 15 tablet 0  . METOPROLOL TARTRATE 25 MG PO TABS Oral Take 25 mg by mouth 2 (two) times daily.    Marland Kitchen ONE-DAILY MULTI VITAMINS PO TABS Oral Take 1 tablet by mouth daily.    Marland Kitchen FISH OIL 1000 MG PO CAPS Oral Take 1 capsule by mouth 2 (two) times daily.    . TRAMADOL HCL 50 MG PO TABS Oral Take 1 tablet (50 mg total) by mouth every 6 (six) hours as needed for pain. 15 tablet 0    BP 151/68  Pulse  76  Temp 98.8 F (37.1 C) (Oral)  Resp 20  SpO2 98%  Physical Exam  Nursing note and vitals reviewed. Constitutional: She is oriented to person, place, and time. Vital signs are normal. She appears well-developed and well-nourished. She is active and cooperative.  HENT:  Head: Normocephalic.  Eyes: Conjunctivae normal are normal. Pupils are equal, round, and reactive to light. No scleral icterus.  Neck: Trachea normal. Neck supple.  Cardiovascular: Normal rate and regular rhythm.   Pulmonary/Chest: Effort normal and breath sounds normal.  Musculoskeletal: Normal range of motion.       Right foot: Normal.       Feet:       Edema and erythema at left great lateral edge of toenail  Neurological: She is alert and oriented to person, place, and time. No cranial nerve deficit or sensory deficit.  Skin: Skin is warm and dry.  Psychiatric: She has a normal mood and affect. Her speech is normal and  behavior is normal. Judgment and thought content normal. Cognition and memory are normal.    ED Course  Procedures (including critical care time)  Labs Reviewed - No data to display No results found.   1. Ingrown right big toenail       MDM  Begin antibiotics as prescribed, follow up with podiatrist for nail removal prn        Johnsie Kindred, NP 12/09/11 1719

## 2011-12-09 NOTE — ED Notes (Signed)
Pt  Reports   painfull       r  Big toe     X  3-4  Weeks          Tender  To  Touch   aggrevated   Several  Days      Ago  When  She  Cut  The  Nails  With  Clippers        Pt  Ambulatory  To  Treatment  Room     Appears  In no  Acute  Distress

## 2011-12-10 NOTE — ED Provider Notes (Signed)
Medical screening examination/treatment/procedure(s) were performed by non-physician practitioner and as supervising physician I was immediately available for consultation/collaboration.  Raynald Blend, MD 12/10/11 [phone]

## 2012-04-20 ENCOUNTER — Emergency Department (HOSPITAL_COMMUNITY): Payer: Medicare Other

## 2012-04-20 ENCOUNTER — Encounter (HOSPITAL_COMMUNITY): Payer: Self-pay

## 2012-04-20 ENCOUNTER — Emergency Department (HOSPITAL_COMMUNITY)
Admission: EM | Admit: 2012-04-20 | Discharge: 2012-04-20 | Disposition: A | Discharge status: Home / Self Care | Payer: Medicare Other | Attending: Emergency Medicine | Admitting: Emergency Medicine

## 2012-04-20 DIAGNOSIS — Z7982 Long term (current) use of aspirin: Secondary | ICD-10-CM | POA: Insufficient documentation

## 2012-04-20 DIAGNOSIS — E7800 Pure hypercholesterolemia, unspecified: Secondary | ICD-10-CM | POA: Insufficient documentation

## 2012-04-20 DIAGNOSIS — S335XXA Sprain of ligaments of lumbar spine, initial encounter: Secondary | ICD-10-CM | POA: Insufficient documentation

## 2012-04-20 DIAGNOSIS — G4733 Obstructive sleep apnea (adult) (pediatric): Secondary | ICD-10-CM | POA: Insufficient documentation

## 2012-04-20 DIAGNOSIS — Z87891 Personal history of nicotine dependence: Secondary | ICD-10-CM | POA: Insufficient documentation

## 2012-04-20 DIAGNOSIS — K219 Gastro-esophageal reflux disease without esophagitis: Secondary | ICD-10-CM | POA: Insufficient documentation

## 2012-04-20 DIAGNOSIS — Z8709 Personal history of other diseases of the respiratory system: Secondary | ICD-10-CM | POA: Insufficient documentation

## 2012-04-20 DIAGNOSIS — Z79899 Other long term (current) drug therapy: Secondary | ICD-10-CM | POA: Insufficient documentation

## 2012-04-20 DIAGNOSIS — Y929 Unspecified place or not applicable: Secondary | ICD-10-CM | POA: Insufficient documentation

## 2012-04-20 DIAGNOSIS — I10 Essential (primary) hypertension: Secondary | ICD-10-CM | POA: Insufficient documentation

## 2012-04-20 DIAGNOSIS — Y9389 Activity, other specified: Secondary | ICD-10-CM | POA: Insufficient documentation

## 2012-04-20 DIAGNOSIS — W07XXXA Fall from chair, initial encounter: Secondary | ICD-10-CM | POA: Insufficient documentation

## 2012-04-20 MED ORDER — CYCLOBENZAPRINE HCL 10 MG PO TABS: 10.0000 mg | ORAL_TABLET | Freq: Two times a day (BID) | ORAL | Status: DC | PRN

## 2012-04-20 MED ORDER — IBUPROFEN 800 MG PO TABS
800.0000 mg | ORAL_TABLET | Freq: Once | ORAL | Status: AC
  Administered 2012-04-20: 800 mg via ORAL
  Filled 2012-04-20: qty 1
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
## 2012-04-20 NOTE — ED Provider Notes (Signed)
Pt seen/examined, here after fall She denies head injury She is in no distress Imaging thus far is negative   Joya Gaskins, MD 04/20/12 [phone]

## 2012-04-20 NOTE — ED Notes (Signed)
Fall on Friday and sts pain in low back and legs worsening everyday since then.

## 2012-04-21 NOTE — ED Provider Notes (Signed)
Medical screening examination/treatment/procedure(s) were conducted as a shared visit with non-physician practitioner(s) and myself.  I personally evaluated the patient during the encounter  Pt in no distress, well appearing, imaging negative, stable for d/c   Joya Gaskins, MD 04/21/12 [phone]

## 2012-05-17 ENCOUNTER — Ambulatory Visit: Payer: Medicare Other | Admitting: Internal Medicine

## 2012-06-23 ENCOUNTER — Ambulatory Visit: Payer: Medicare Other | Admitting: Internal Medicine

## 2012-07-07 ENCOUNTER — Other Ambulatory Visit (HOSPITAL_COMMUNITY): Payer: Self-pay | Admitting: Pediatrics

## 2012-07-07 ENCOUNTER — Other Ambulatory Visit (HOSPITAL_COMMUNITY): Payer: Self-pay | Admitting: Family Medicine

## 2012-07-07 DIAGNOSIS — Z1231 Encounter for screening mammogram for malignant neoplasm of breast: Secondary | ICD-10-CM

## 2012-08-02 ENCOUNTER — Ambulatory Visit (HOSPITAL_COMMUNITY)
Admission: RE | Admit: 2012-08-02 | Payer: Medicare Other | Source: Ambulatory Visit | Attending: Pediatrics | Admitting: Pediatrics

## 2012-08-06 ENCOUNTER — Emergency Department (HOSPITAL_COMMUNITY): Payer: Medicare Other

## 2012-08-06 ENCOUNTER — Emergency Department (HOSPITAL_COMMUNITY)
Admission: EM | Admit: 2012-08-06 | Discharge: 2012-08-06 | Disposition: A | Discharge status: Home / Self Care | Payer: Medicare Other | Attending: Emergency Medicine | Admitting: Emergency Medicine

## 2012-08-06 ENCOUNTER — Encounter (HOSPITAL_COMMUNITY): Payer: Self-pay | Admitting: Emergency Medicine

## 2012-08-06 DIAGNOSIS — G4733 Obstructive sleep apnea (adult) (pediatric): Secondary | ICD-10-CM | POA: Insufficient documentation

## 2012-08-06 DIAGNOSIS — K219 Gastro-esophageal reflux disease without esophagitis: Secondary | ICD-10-CM | POA: Insufficient documentation

## 2012-08-06 DIAGNOSIS — Z79899 Other long term (current) drug therapy: Secondary | ICD-10-CM | POA: Insufficient documentation

## 2012-08-06 DIAGNOSIS — I10 Essential (primary) hypertension: Secondary | ICD-10-CM | POA: Insufficient documentation

## 2012-08-06 DIAGNOSIS — J42 Unspecified chronic bronchitis: Secondary | ICD-10-CM

## 2012-08-06 DIAGNOSIS — E7800 Pure hypercholesterolemia, unspecified: Secondary | ICD-10-CM | POA: Insufficient documentation

## 2012-08-06 DIAGNOSIS — Z7982 Long term (current) use of aspirin: Secondary | ICD-10-CM | POA: Insufficient documentation

## 2012-08-06 DIAGNOSIS — Z888 Allergy status to other drugs, medicaments and biological substances status: Secondary | ICD-10-CM | POA: Insufficient documentation

## 2012-08-06 DIAGNOSIS — R059 Cough, unspecified: Secondary | ICD-10-CM | POA: Insufficient documentation

## 2012-08-06 DIAGNOSIS — Z87891 Personal history of nicotine dependence: Secondary | ICD-10-CM | POA: Insufficient documentation

## 2012-08-06 DIAGNOSIS — R05 Cough: Secondary | ICD-10-CM | POA: Insufficient documentation

## 2012-08-06 LAB — POCT I-STAT, CHEM 8
BUN: 19 mg/dL (ref 6–23)
Calcium, Ion: 1.15 mmol/L (ref 1.13–1.30)
Chloride: 105 meq/L (ref 96–112)
Creatinine, Ser: 1 mg/dL (ref 0.50–1.10)
Glucose, Bld: 90 mg/dL (ref 70–99)
HCT: 38 % (ref 36.0–46.0)
Hemoglobin: 12.9 g/dL (ref 12.0–15.0)
Potassium: 3.6 meq/L (ref 3.5–5.1)
Sodium: 142 mEq/L (ref 135–145)
TCO2: 30 mmol/L (ref 0–100)

## 2012-08-06 LAB — POCT I-STAT TROPONIN I: Troponin i, poc: 0 ng/mL (ref 0.00–0.08)

## 2012-08-06 MED ORDER — PREDNISONE 20 MG PO TABS: 40.0000 mg | ORAL_TABLET | Freq: Every day | ORAL | Status: DC

## 2012-08-06 MED ORDER — LORATADINE 10 MG PO TABS: 10.0000 mg | ORAL_TABLET | Freq: Every day | ORAL | Status: DC

## 2012-08-06 MED ORDER — IPRATROPIUM BROMIDE 0.02 % IN SOLN
0.5000 mg | Freq: Once | RESPIRATORY_TRACT | Status: AC
  Administered 2012-08-06: 0.5 mg via RESPIRATORY_TRACT
  Filled 2012-08-06: qty 2.5

## 2012-08-06 MED ORDER — ALBUTEROL SULFATE (5 MG/ML) 0.5% IN NEBU
5.0000 mg | INHALATION_SOLUTION | Freq: Once | RESPIRATORY_TRACT | Status: AC
  Administered 2012-08-06: 5 mg via RESPIRATORY_TRACT
  Filled 2012-08-06: qty 1

## 2012-08-06 MED ORDER — ALBUTEROL SULFATE HFA 108 (90 BASE) MCG/ACT IN AERS: 2.0000 | INHALATION_SPRAY | Freq: Four times a day (QID) | RESPIRATORY_TRACT | Status: DC | PRN

## 2012-08-06 MED ORDER — HYDROCOD POLST-CHLORPHEN POLST 10-8 MG/5ML PO LQCR: 5.0000 mL | Freq: Two times a day (BID) | ORAL | Status: DC | PRN

## 2012-08-06 MED ORDER — METOPROLOL TARTRATE 50 MG PO TABS: 50.0000 mg | ORAL_TABLET | Freq: Two times a day (BID) | ORAL | Status: DC

## 2012-08-06 NOTE — ED Notes (Signed)
Pt c/o SOB, dizziness and cough; pt sts cough x several weeks but SOB worse today

## 2012-08-06 NOTE — ED Provider Notes (Signed)
History     CSN: [id number]  Arrival date & time 08/06/12  1703   First MD Initiated Contact with Patient 08/06/12 1727      Chief Complaint  Patient presents with  . Shortness of Breath    (Consider location/radiation/quality/duration/timing/severity/associated sxs/prior treatment) HPI Comments: Pt reports no formal h/o asthma,. COPD, CHF, but has been treated for wheezing withrescue inhaler in the past, pt has been seen by Dr. Maple Hudson due to sleep apnea.  She used to see Dr. Mardelle Matte for primary care, but was dismissed for unknown reasons to her.  She reports cough for past 3 months. Was tried on abx, got better briefly, but then occurred again.  Also has been taking Mucinex which also helped transiently only.  She now has chest pressure across whole chest and upper torso since today, constant.  nothing makes better or worse.  Pt has had a stress test and heart cath years ago, told it was ok.  She has no prior h/o CVA, CAD.  She tried her inhaler with no sig relief.  She also has h/o GERD, allergies, but is not anything for allergies at present.  She is taking meds  For reflux.  No sweats, no N/V/D.  No sore throat   Patient is a 62 y.o. female presenting with shortness of breath. The history is provided by the patient and medical records.  Shortness of Breath Associated symptoms: cough   Associated symptoms: no abdominal pain, no fever, no neck pain, no sore throat, no vomiting and no wheezing     Past Medical History  Diagnosis Date  . Rhinitis   . OSA (obstructive sleep apnea)   . GERD (gastroesophageal reflux disease)   . HTN (hypertension)   . High cholesterol     Past Surgical History  Procedure Laterality Date  . Uvulopalatopharyngoplasty    . Cholecystectomy    . Total abdominal hysterectomy    . Lipoma excision      Family History  Problem Relation Age of Onset  . Emphysema Father   . Asthma Brother   . Heart disease Mother   . Heart disease Brother   . Rheum  arthritis Father   . Rheum arthritis Brother   . Lung cancer Father   . Diabetes Brother     History  Substance Use Topics  . Smoking status: Former Smoker -- 0.80 packs/day for 20 years    Types: Cigarettes    Quit date: 03/10/1991  . Smokeless tobacco: Not on file  . Alcohol Use: No    OB History   Grav Para Term Preterm Abortions TAB SAB Ect Mult Living                  Review of Systems  Constitutional: Negative for fever and chills.  HENT: Negative for congestion, sore throat, rhinorrhea, sneezing, neck pain and postnasal drip.   Respiratory: Positive for cough, chest tightness and shortness of breath. Negative for wheezing.   Cardiovascular: Negative for palpitations and leg swelling.  Gastrointestinal: Negative for nausea, vomiting and abdominal pain.  Musculoskeletal: Negative for back pain.  All other systems reviewed and are negative.    Allergies  Naproxen and Other  Home Medications   Current Outpatient Rx  Name  Route  Sig  Dispense  Refill  . aspirin 81 MG chewable tablet   Oral   Chew 81 mg by mouth every evening.         . escitalopram (LEXAPRO) 10 MG tablet  Oral   Take 10 mg by mouth daily.         Marland Kitchen esomeprazole (NEXIUM) 40 MG capsule   Oral   Take 40 mg by mouth daily before breakfast.         . guaiFENesin (MUCINEX) 600 MG 12 hr tablet   Oral   Take 1,200 mg by mouth 2 (two) times daily as needed for congestion.         . meloxicam (MOBIC) 15 MG tablet   Oral   Take 15 mg by mouth daily as needed for pain (and inflammation).          . Multiple Vitamin (MULTIVITAMIN) tablet   Oral   Take 1 tablet by mouth daily.         . Omega-3 Fatty Acids (FISH OIL) 1000 MG CAPS   Oral   Take 2 capsules by mouth daily.          . simvastatin (ZOCOR) 20 MG tablet   Oral   Take 20 mg by mouth every evening.         . solifenacin (VESICARE) 5 MG tablet   Oral   Take 5 mg by mouth daily.         . vitamin B-12  (CYANOCOBALAMIN) 1000 MCG tablet   Oral   Take 1,000 mcg by mouth daily.         Marland Kitchen albuterol (PROVENTIL HFA;VENTOLIN HFA) 108 (90 BASE) MCG/ACT inhaler   Inhalation   Inhale 2 puffs into the lungs every 6 (six) hours as needed for wheezing.   1 Inhaler   0   . chlorpheniramine-HYDROcodone (TUSSIONEX PENNKINETIC ER) 10-8 MG/5ML LQCR   Oral   Take 5 mLs by mouth every 12 (twelve) hours as needed.   80 mL   0   . loratadine (CLARITIN) 10 MG tablet   Oral   Take 1 tablet (10 mg total) by mouth daily.   30 tablet   0   . metoprolol (LOPRESSOR) 50 MG tablet   Oral   Take 1 tablet (50 mg total) by mouth 2 (two) times daily.   60 tablet   0   . predniSONE (DELTASONE) 20 MG tablet   Oral   Take 2 tablets (40 mg total) by mouth daily.   12 tablet   0     BP 121/51  Pulse 94  Temp(Src) 97.7 F (36.5 C) (Oral)  Resp 20  SpO2 96%  Physical Exam  Nursing note and vitals reviewed. Constitutional: She is oriented to person, place, and time. She appears well-developed and well-nourished. No distress.  HENT:  Head: Normocephalic and atraumatic.  Right Ear: External ear normal.  Left Ear: External ear normal.  Nose: Nose normal.  Mouth/Throat: Oropharynx is clear and moist. No oropharyngeal exudate.  Eyes: EOM are normal.  Neck: Normal range of motion. Neck supple. No tracheal deviation present.  Cardiovascular: Normal rate, regular rhythm and intact distal pulses.   No murmur heard. Pulmonary/Chest: Effort normal. No stridor. No respiratory distress. She has no wheezes. She has no rales. She exhibits no tenderness.  Abdominal: Soft. She exhibits no distension. There is no tenderness. There is no rebound and no guarding.  Lymphadenopathy:    She has no cervical adenopathy.  Neurological: She is alert and oriented to person, place, and time. She exhibits normal muscle tone. Coordination normal.  Skin: Skin is warm and dry. She is not diaphoretic. No pallor.  Psychiatric:  She has a normal mood  and affect.    ED Course  Procedures (including critical care time)  Labs Reviewed  POCT I-STAT, CHEM 8  POCT I-STAT TROPONIN I   Dg Chest 2 View  08/06/2012   *RADIOLOGY REPORT*  Clinical Data: Chronic cough.  Shortness of breath.  CHEST - 2 VIEW  Comparison:  06/03/2011  Findings:  The heart size and mediastinal contours are within normal limits.  Both lungs are clear.  The visualized skeletal structures are unremarkable.  IMPRESSION: No active cardiopulmonary disease.   Original Report Authenticated By: Myles Rosenthal, M.D.     1. Chronic bronchitis     ra sat is 96% and I interpret to be adequate  ECG at time 17:09 shows sinus tachycardia at rate 107, normal axis, T wave inversions inferiorly, and anteriorly.  Normal intervals.  Abn ECG.  No prior ECG's available.     7:10 PM I reviewed CXR images myself.  troponn and i stat 8 are normal.  PT is feeling  improved after neb.  She also asks for a refil of her metoprolol.    MDM  Pt has had numerous surgeries for sinuses, tonsils in the past.  Also has had severe GERD and duodenitis diagnosed after endoscopy in 2004.  Pt has several possible etiologies for chronic cough including chronic bronchitis, GERD, allergic rhinitis and post nasal drip.   Pt is already on PPI.  Will treat allergies and give cough suppressant.          Gavin Pound. Oletta Lamas, MD 08/06/12 2440

## 2012-08-09 ENCOUNTER — Telehealth: Payer: Self-pay | Admitting: Internal Medicine

## 2012-08-09 NOTE — Telephone Encounter (Signed)
Spoke with patient-appt made for Monday 08-15-12 at 11:15am. Nothing more needed at this time. Will sign off on message.

## 2012-08-09 NOTE — Telephone Encounter (Signed)
Last OV 05-15-11. Pt states she went to the ER on 08-06-12 for SOB and was advised to f/u with Dr. Maple Hudson. Next available is 09-27-12 and pt wants sooner appt. Pt is c/o having increased SOB, productive cough with clear, thick phlegm, chest tightness and wheezing. Pt is currently on prednisone taper form ED doc. Please advise if sooner appt is available. Carron Curie, CMA Allergies  Allergen Reactions  . Naproxen     Itching after taking last night   . Other Itching    Acidic foods

## 2012-08-15 ENCOUNTER — Encounter: Payer: Self-pay | Admitting: Internal Medicine

## 2012-08-15 ENCOUNTER — Ambulatory Visit (INDEPENDENT_AMBULATORY_CARE_PROVIDER_SITE_OTHER): Payer: Medicare Other | Admitting: Internal Medicine

## 2012-08-15 VITALS — BP 118/74 | HR 58 | Ht 65.0 in | Wt 271.6 lb

## 2012-08-15 DIAGNOSIS — J209 Acute bronchitis, unspecified: Secondary | ICD-10-CM

## 2012-08-15 DIAGNOSIS — G4733 Obstructive sleep apnea (adult) (pediatric): Secondary | ICD-10-CM

## 2012-08-15 MED ORDER — MOMETASONE FURO-FORMOTEROL FUM 100-5 MCG/ACT IN AERO: 2.0000 | INHALATION_SPRAY | Freq: Two times a day (BID) | RESPIRATORY_TRACT | Status: DC

## 2012-08-15 NOTE — Progress Notes (Signed)
05/15/11 60 yoF former smoker followed for OSA/ UPPP/CPAP, allergic rhinitis, complicated by HBP, GERD LOV-05/16/09 daughter here Continues CPAP from [facility] but wants to change home care companies because of service. Using a nasal mask and humidifier. Able to use it all night every night routinely. Describes perennial rhinitis with nasal congestion, sneezing, drainage. Prescription nasal sprays in the past have helped only briefly. Chest has been comfortably controlled with a little wheeze during the winter only, possibly with a cold.  08/15/12- 60 yoF former smoker followed for OSA/ UPPP/CPAP, allergic rhinitis, complicated by HBP, GERD FOLLOWS FOR: went to ED Aug 06, 2012; having SOB, cough, wheezing as well-was put on Pred taper; feeling better now but told to follow up with CY. Blames pollen for exacerbation- ED visit. Finished a prednisone taper. Feels well today except persistent cough with white sputum. CXR 08/06/12 IMPRESSION:  No active cardiopulmonary disease.  Original Report Authenticated By: Myles Rosenthal, M.D  ROS-see HPI Constitutional:   No-   weight loss, night sweats, fevers, chills, fatigue, lassitude. HEENT:   No-  headaches, difficulty swallowing, tooth/dental problems, sore throat,       No- sneezing, itching, ear ache, nasal congestion, post nasal drip,  CV:  No-   chest pain, orthopnea, PND, swelling in lower extremities, anasarca, dizziness, palpitations Resp: No-   shortness of breath with exertion or at rest.              +  productive cough,  No non-productive cough,  No- coughing up of blood.              No-   change in color of mucus.  No- wheezing.   Skin: No-   rash or lesions. GI:  No-   heartburn, indigestion, abdominal pain, nausea, vomiting, GU: No-   dysuria,  MS:  No-   joint pain or swelling.  No- decreased range of motion.  No- back pain. Neuro-     nothing unusual Psych:  No- change in mood or affect. No depression or anxiety.  No memory  loss.  OBJ- Physical Exam General- Alert, Oriented, Affect-appropriate, Distress- none acute, overweight Skin- diffuse spotting- nevi or vitelligo Lymphadenopathy- none Head- atraumatic            Eyes- Gross vision intact, PERRLA, conjunctivae and secretions clear            Ears- Hearing, canals-normal            Nose- Clear, no-Septal dev,  +mucus, polyps, erosion, perforation             Throat- Mallampati III- s/p UPPP , mucosa clear , drainage- none, tonsils- atrophic Neck- flexible , trachea midline, no stridor , thyroid nl, carotid no bruit Chest - symmetrical excursion , unlabored           Heart/CV- RRR , no murmur , no gallop  , no rub, nl s1 s2                           - JVD- none , edema- none, stasis changes- none, varices- none           Lung- clear to P&A, wheeze- none, cough with deep breath , dullness-none, rub- none           Chest wall-  Abd-  Br/ Gen/ Rectal- Not done, not indicated Extrem- cyanosis- none, clubbing, none, atrophy- none, strength- nl Neuro- grossly intact to observation

## 2012-08-15 NOTE — Patient Instructions (Addendum)
Order- [facility] She would like to change CPAP DME from [facility] due to poor service             CPAP 15, mask of choice, humidifier, supplies     Dx OSA  Sample and script for Dulera 100 maintenance inhaler    Use this 2 puffs then rinse mouth, twice every day  Use your rescue inhaler if needed

## 2012-08-23 ENCOUNTER — Other Ambulatory Visit (HOSPITAL_COMMUNITY): Payer: Self-pay | Admitting: Family Medicine

## 2012-08-23 ENCOUNTER — Encounter: Payer: Self-pay | Admitting: Internal Medicine

## 2012-08-23 ENCOUNTER — Ambulatory Visit (HOSPITAL_COMMUNITY)
Admission: RE | Admit: 2012-08-23 | Discharge: 2012-08-23 | Disposition: A | Discharge status: Home / Self Care | Payer: Medicare Other | Source: Ambulatory Visit | Attending: Family Medicine | Admitting: Family Medicine

## 2012-08-23 DIAGNOSIS — Z1231 Encounter for screening mammogram for malignant neoplasm of breast: Secondary | ICD-10-CM

## 2012-08-28 ENCOUNTER — Encounter: Payer: Self-pay | Admitting: Internal Medicine

## 2012-08-28 NOTE — Assessment & Plan Note (Signed)
She wants to change her DME company again because of wwhat she considers poor service. We will have the [facility] look into it Pressure seems appropriate.

## 2012-08-28 NOTE — Assessment & Plan Note (Signed)
Acute bronchitis, allergic by history and now resolved. Plan-try [facility]

## 2012-08-31 ENCOUNTER — Encounter (HOSPITAL_COMMUNITY): Payer: Self-pay | Admitting: *Deleted

## 2012-08-31 DIAGNOSIS — I10 Essential (primary) hypertension: Secondary | ICD-10-CM | POA: Insufficient documentation

## 2012-08-31 DIAGNOSIS — Z87891 Personal history of nicotine dependence: Secondary | ICD-10-CM | POA: Insufficient documentation

## 2012-08-31 DIAGNOSIS — M25529 Pain in unspecified elbow: Secondary | ICD-10-CM | POA: Insufficient documentation

## 2012-08-31 DIAGNOSIS — Z79899 Other long term (current) drug therapy: Secondary | ICD-10-CM | POA: Insufficient documentation

## 2012-08-31 DIAGNOSIS — Z8709 Personal history of other diseases of the respiratory system: Secondary | ICD-10-CM | POA: Insufficient documentation

## 2012-08-31 DIAGNOSIS — Z862 Personal history of diseases of the blood and blood-forming organs and certain disorders involving the immune mechanism: Secondary | ICD-10-CM | POA: Insufficient documentation

## 2012-08-31 DIAGNOSIS — Z7982 Long term (current) use of aspirin: Secondary | ICD-10-CM | POA: Insufficient documentation

## 2012-08-31 DIAGNOSIS — K219 Gastro-esophageal reflux disease without esophagitis: Secondary | ICD-10-CM | POA: Insufficient documentation

## 2012-08-31 DIAGNOSIS — Z8639 Personal history of other endocrine, nutritional and metabolic disease: Secondary | ICD-10-CM | POA: Insufficient documentation

## 2012-08-31 DIAGNOSIS — R42 Dizziness and giddiness: Secondary | ICD-10-CM | POA: Insufficient documentation

## 2012-08-31 DIAGNOSIS — Z8669 Personal history of other diseases of the nervous system and sense organs: Secondary | ICD-10-CM | POA: Insufficient documentation

## 2012-08-31 NOTE — ED Notes (Signed)
Pt c/o elbow pain since yesterday. Also states dizziness x 3 days.

## 2012-08-31 NOTE — ED Notes (Signed)
No neuro deficits noted.

## 2012-09-01 ENCOUNTER — Emergency Department (HOSPITAL_COMMUNITY)
Admission: EM | Admit: 2012-09-01 | Discharge: 2012-09-01 | Disposition: A | Discharge status: Home / Self Care | Payer: Medicare Other | Attending: Emergency Medicine | Admitting: Emergency Medicine

## 2012-09-01 ENCOUNTER — Emergency Department (HOSPITAL_COMMUNITY): Payer: Medicare Other

## 2012-09-01 DIAGNOSIS — M25521 Pain in right elbow: Secondary | ICD-10-CM

## 2012-09-01 DIAGNOSIS — R42 Dizziness and giddiness: Secondary | ICD-10-CM

## 2012-09-01 LAB — POCT I-STAT, CHEM 8
BUN: 15 mg/dL (ref 6–23)
Calcium, Ion: 1.12 mmol/L — ABNORMAL LOW (ref 1.13–1.30)
Creatinine, Ser: 1 mg/dL (ref 0.50–1.10)
Hemoglobin: 12.6 g/dL (ref 12.0–15.0)
TCO2: 29 mmol/L (ref 0–100)

## 2012-09-01 MED ORDER — OXYCODONE-ACETAMINOPHEN 5-325 MG PO TABS: 1.0000 | ORAL_TABLET | Freq: Four times a day (QID) | ORAL | Status: DC | PRN

## 2012-09-01 MED ORDER — ESCITALOPRAM OXALATE 10 MG PO TABS: 10.0000 mg | ORAL_TABLET | Freq: Every day | ORAL | Status: DC

## 2012-09-01 NOTE — ED Provider Notes (Addendum)
History    CSN: [id number] Arrival date & time 08/31/12  2009  First MD Initiated Contact with Patient 09/01/12 [phone]     Chief Complaint  Patient presents with  . Elbow Pain   (Consider location/radiation/quality/duration/timing/severity/associated sxs/prior Treatment) HPI Pt presents with c/o right elbow pain- she states pain began last night and has been increasing.  No known injury.  No warmth or redness overlying elbow.  She states she does have a hx of arthritis but has not had pain in this elbow.  She also c/o feeling lightheaded, denies sensation of spinning.  States this has been ongoing for the past 3 days.  States approx 1 week ago she abruptly stopped her lexapro because she ran out.  No severe headache, no chest pain or palpitations, no fainting.  No changes in vision or speech or weakness of arms or legs.   Pain is right elbow is worse with palpation.  She has applied ace bandage which has not helped her discomfort.  No swelling of her arm/hand/fingers.  There are no other associated systemic symptoms, there are no other alleviating or modifying factors.  Past Medical History  Diagnosis Date  . Rhinitis   . OSA (obstructive sleep apnea)   . GERD (gastroesophageal reflux disease)   . HTN (hypertension)   . High cholesterol    Past Surgical History  Procedure Laterality Date  . Uvulopalatopharyngoplasty    . Cholecystectomy    . Total abdominal hysterectomy    . Lipoma excision     Family History  Problem Relation Age of Onset  . Emphysema Father   . Asthma Brother   . Heart disease Mother   . Heart disease Brother   . Rheum arthritis Father   . Rheum arthritis Brother   . Lung cancer Father   . Diabetes Brother    History  Substance Use Topics  . Smoking status: Former Smoker -- 0.80 packs/day for 20 years    Types: Cigarettes    Quit date: 03/10/1991  . Smokeless tobacco: Not on file  . Alcohol Use: No   OB History   Grav Para Term Preterm Abortions TAB  SAB Ect Mult Living                 Review of Systems ROS reviewed and all otherwise negative except for mentioned in HPI  Allergies  Naproxen and Other  Home Medications   Current Outpatient Rx  Name  Route  Sig  Dispense  Refill  . albuterol (PROVENTIL HFA;VENTOLIN HFA) 108 (90 BASE) MCG/ACT inhaler   Inhalation   Inhale 2 puffs into the lungs every 6 (six) hours as needed for wheezing.   1 Inhaler   0   . aspirin 81 MG chewable tablet   Oral   Chew 81 mg by mouth every evening.         . escitalopram (LEXAPRO) 10 MG tablet   Oral   Take 10 mg by mouth 2 (two) times daily.          Marland Kitchen esomeprazole (NEXIUM) 40 MG capsule   Oral   Take 40 mg by mouth daily before breakfast.         . meloxicam (MOBIC) 15 MG tablet   Oral   Take 15 mg by mouth daily as needed for pain (and inflammation).          . metoprolol (LOPRESSOR) 50 MG tablet   Oral   Take 1 tablet (50 mg total) by  mouth 2 (two) times daily.   60 tablet   0   . mometasone-formoterol (DULERA) 100-5 MCG/ACT AERO   Inhalation   Inhale 2 puffs into the lungs 2 (two) times daily. Rinse mouth   1 Inhaler   prn   . Multiple Vitamin (MULTIVITAMIN) tablet   Oral   Take 1 tablet by mouth daily.         . Omega-3 Fatty Acids (FISH OIL) 1000 MG CAPS   Oral   Take 2 capsules by mouth daily.          . vitamin B-12 (CYANOCOBALAMIN) 1000 MCG tablet   Oral   Take 1,000 mcg by mouth daily.          BP 159/74  Pulse 72  Temp(Src) 98.7 F (37.1 C) (Oral)  Resp 20  SpO2 98% Vitals reviewed Physical Exam Physical Examination: General appearance - alert, well appearing, and in no distress Mental status - alert, oriented to person, place, and time Eyes - pupils equal and reactive, extraocular eye movements intact Mouth - mucous membranes moist, pharynx normal without lesions Chest - clear to auscultation, no wheezes, rales or rhonchi, symmetric air entry Heart - normal rate, regular rhythm,  normal S1, S2, no murmurs, rubs, clicks or gallops Abdomen - soft, nontender, nondistended, no masses or organomegaly Neurological - alert, oriented x 3, cranial nerves grossly intact, strength 5/5 in extremities x 4, sensation intact Musculoskeletal - ttp over medial aspect of right elbow- no overlying erythema or warmth some pain with ROM of right elbow but movements/range of motion is intact, otherwise no joint tenderness, deformity or swelling Extremities - peripheral pulses normal, no pedal edema, no clubbing or cyanosis Skin - normal coloration and turgor, no rashes  ED Course  Procedures (including critical care time)   Date: 09/01/2012  Rate: 57  Rhythm: normal sinus rhythm  QRS Axis: normal  Intervals: normal  ST/T Wave abnormalities: normal  Conduction Disutrbances: none  Narrative Interpretation: unremarkable, no significant changes compared to prior ekg of 08/06/12     Labs Reviewed  POCT I-STAT, CHEM 8 - Abnormal; Notable for the following:    Glucose, Bld 105 (*)    Calcium, Ion 1.12 (*)    All other components within normal limits   Dg Elbow Complete Right  09/01/2012   *RADIOLOGY REPORT*  Clinical Data: Elbow pain.  No known injury.  RIGHT ELBOW - COMPLETE 3+ VIEW  Comparison: None.  Findings: There is irregularity along the medial epicondyle. Multiple well corticated bone fragments.  This likely is related to old injury.  No acute fracture.  No subluxation or dislocation.  No joint effusion.  IMPRESSION: No acute bony abnormality.   Original Report Authenticated By: Charlett Nose, M.D.   1. Elbow pain, right   2. Dizziness     MDM  Pt presenting with c/o right elbow pain- xray shows signs of remote injury, so this may be resultant arthritis- no signs of effusion.  On exam no signs of DVT, septic arthritis, cellulitis or other acute emergent condition at this time.  EKG reassuring as well as labs- no anemia.  Lightheaded feeling may be due to the abrupt  discontinuation of lexapro- will give rx and patient encouarged to arrange for close f/u with her PMD.  Discharged with strict return precautions.  Pt agreeable with plan.  Ethelda Chick, MD 09/01/12 1478  Ethelda Chick, MD 09/01/12 [phone]

## 2012-09-22 ENCOUNTER — Emergency Department (INDEPENDENT_AMBULATORY_CARE_PROVIDER_SITE_OTHER)
Admission: EM | Admit: 2012-09-22 | Discharge: 2012-09-22 | Disposition: A | Discharge status: Home / Self Care | Payer: Medicare Other | Source: Home / Self Care

## 2012-09-22 ENCOUNTER — Encounter (HOSPITAL_COMMUNITY): Payer: Self-pay | Admitting: *Deleted

## 2012-09-22 DIAGNOSIS — I10 Essential (primary) hypertension: Secondary | ICD-10-CM

## 2012-09-22 MED ORDER — METOPROLOL TARTRATE 100 MG PO TABS: ORAL_TABLET | ORAL | Status: DC

## 2012-09-22 NOTE — ED Provider Notes (Signed)
History    CSN: [id number] Arrival date & time 09/22/12  4098  First MD Initiated Contact with Patient 09/22/12 1906     Chief Complaint  Patient presents with  . Medication Refill   (Consider location/radiation/quality/duration/timing/severity/associated sxs/prior Treatment) HPI Comments: 62 year old female was discharge from her PCP for noncompliance presents to the urgent care as requested by social services to have her blood pressure medicine refill. She is uncertain as to what her blood pressure medicine is but believes it is metoprolol. Her last refill was from the ER. She has no complaints to me.  Past Medical History  Diagnosis Date  . Rhinitis   . OSA (obstructive sleep apnea)   . GERD (gastroesophageal reflux disease)   . HTN (hypertension)   . High cholesterol    Past Surgical History  Procedure Laterality Date  . Uvulopalatopharyngoplasty    . Cholecystectomy    . Total abdominal hysterectomy    . Lipoma excision     Family History  Problem Relation Age of Onset  . Emphysema Father   . Rheum arthritis Father   . Lung cancer Father   . Asthma Brother   . Heart disease Mother   . Heart disease Brother   . Rheum arthritis Brother   . Diabetes Brother    History  Substance Use Topics  . Smoking status: Former Smoker -- 0.80 packs/day for 20 years    Types: Cigarettes    Quit date: 03/10/1991  . Smokeless tobacco: Not on file  . Alcohol Use: Yes     Comment: rarely   OB History   Grav Para Term Preterm Abortions TAB SAB Ect Mult Living                 Review of Systems  Constitutional: Negative.   Respiratory: Negative.   Cardiovascular: Negative.   Neurological: Negative.     Allergies  Naproxen and Other  Home Medications   Current Outpatient Rx  Name  Route  Sig  Dispense  Refill  . albuterol (PROVENTIL HFA;VENTOLIN HFA) 108 (90 BASE) MCG/ACT inhaler   Inhalation   Inhale 2 puffs into the lungs every 6 (six) hours as needed for  wheezing.   1 Inhaler   0   . aspirin 81 MG chewable tablet   Oral   Chew 81 mg by mouth every evening.         . escitalopram (LEXAPRO) 10 MG tablet   Oral   Take 10 mg by mouth 2 (two) times daily.          Marland Kitchen esomeprazole (NEXIUM) 40 MG capsule   Oral   Take 40 mg by mouth daily before breakfast.         . meloxicam (MOBIC) 15 MG tablet   Oral   Take 15 mg by mouth daily as needed for pain (and inflammation).          . metoprolol (LOPRESSOR) 50 MG tablet   Oral   Take 1 tablet (50 mg total) by mouth 2 (two) times daily.   60 tablet   0   . mometasone-formoterol (DULERA) 100-5 MCG/ACT AERO   Inhalation   Inhale 2 puffs into the lungs 2 (two) times daily. Rinse mouth   1 Inhaler   prn   . Multiple Vitamin (MULTIVITAMIN) tablet   Oral   Take 1 tablet by mouth daily.         . Omega-3 Fatty Acids (FISH OIL) 1000 MG CAPS  Oral   Take 2 capsules by mouth daily.          . vitamin B-12 (CYANOCOBALAMIN) 1000 MCG tablet   Oral   Take 1,000 mcg by mouth daily.         Marland Kitchen escitalopram (LEXAPRO) 10 MG tablet   Oral   Take 1 tablet (10 mg total) by mouth daily.   30 tablet   0   . oxyCODONE-acetaminophen (PERCOCET/ROXICET) 5-325 MG per tablet   Oral   Take 1-2 tablets by mouth every 6 (six) hours as needed for pain.   15 tablet   0    BP 136/70  Pulse 92  Temp(Src) 98 F (36.7 C) (Oral)  Resp 20  SpO2 98% Physical Exam  Nursing note and vitals reviewed. Constitutional: She is oriented to person, place, and time. No distress.  Obese, morbid  HENT:  Mouth/Throat: Oropharynx is clear and moist.  Eyes: Conjunctivae and EOM are normal.  Neck: Normal range of motion. Neck supple.  Cardiovascular: Normal rate, regular rhythm and normal heart sounds.   Pulmonary/Chest: Effort normal and breath sounds normal. She has no wheezes.  Musculoskeletal: She exhibits no edema and no tenderness.  Neurological: She is alert and oriented to person, place, and  time.  Skin: Skin is warm and dry.    ED Course  Procedures (including critical care time) Labs Reviewed - No data to display No results found. 1. HTN (hypertension)     MDM  Metoprolol 50 bid  Must see PCP as soon as possible.    Hayden Rasmussen, NP 09/22/12 2014  Hayden Rasmussen, NP 09/22/12 2015

## 2012-09-22 NOTE — ED Notes (Addendum)
Here for BP meds. Has been out for 1 week.  C/o burred vision, pressure in her head and lightheaded.  She is taking her husbands BP meds from [place] until Tues.  She thinks it was Lisinopril.

## 2012-09-24 NOTE — ED Provider Notes (Signed)
Medical screening examination/treatment/procedure(s) were performed by resident physician or non-physician practitioner and as supervising physician I was immediately available for consultation/collaboration.   Shanen Norris DOUGLAS MD.   Greely Atiyeh D Siya Flurry, MD 09/24/12 0945 

## 2012-09-27 ENCOUNTER — Ambulatory Visit: Payer: Medicare Other | Admitting: Internal Medicine

## 2012-12-15 ENCOUNTER — Encounter (INDEPENDENT_AMBULATORY_CARE_PROVIDER_SITE_OTHER): Payer: Self-pay

## 2012-12-15 ENCOUNTER — Encounter: Payer: Self-pay | Admitting: Internal Medicine

## 2012-12-15 ENCOUNTER — Ambulatory Visit (INDEPENDENT_AMBULATORY_CARE_PROVIDER_SITE_OTHER): Payer: Medicare Other | Admitting: Internal Medicine

## 2012-12-15 VITALS — BP 130/76 | HR 66 | Ht 65.0 in | Wt 269.2 lb

## 2012-12-15 DIAGNOSIS — G4733 Obstructive sleep apnea (adult) (pediatric): Secondary | ICD-10-CM

## 2012-12-15 DIAGNOSIS — J310 Chronic rhinitis: Secondary | ICD-10-CM

## 2012-12-15 NOTE — Progress Notes (Signed)
05/15/11 60 yoF former smoker followed for OSA/ UPPP/CPAP, allergic rhinitis, complicated by HBP, GERD LOV-05/16/09 daughter here Continues CPAP from [facility] but wants to change home care companies because of service. Using a nasal mask and humidifier. Able to use it all night every night routinely. Describes perennial rhinitis with nasal congestion, sneezing, drainage. Prescription nasal sprays in the past have helped only briefly. Chest has been comfortably controlled with a little wheeze during the winter only, possibly with a cold.  08/15/12- 60 yoF former smoker followed for OSA/ UPPP/CPAP, allergic rhinitis, complicated by HBP, GERD FOLLOWS FOR: went to ED Aug 06, 2012; having SOB, cough, wheezing as well-was put on Pred taper; feeling better now but told to follow up with CY. Blames pollen for exacerbation- ED visit. Finished a prednisone taper. Feels well today except persistent cough with white sputum. CXR 08/06/12 IMPRESSION:  No active cardiopulmonary disease.  Original Report Authenticated By: Myles Rosenthal, M.D  12/15/12- 67 yoF former smoker followed for OSA/ UPPP/CPAP, allergic rhinitis, complicated by HBP, GERD Breathing is unchanged.  Has occas SOB and has a cough - mostly dry.   Wearing cpap every night for approx 7-8 hours.  No problems with mask or pressure. Likes CPAP 15/Advanced. Nasal mask works well. Occasional mild shortness of breath with dry cough-not concerned. Eyes dry and itch. CXR 08/06/12 IMPRESSION:  No active cardiopulmonary disease.  Original Report Authenticated By: Myles Rosenthal, M.D.  ROS-see HPI Constitutional:   No-   weight loss, night sweats, fevers, chills, fatigue, lassitude. HEENT:   No-  headaches, difficulty swallowing, tooth/dental problems, sore throat,       No- sneezing, itching, ear ache, nasal congestion, post nasal drip,  CV:  No-   chest pain, orthopnea, PND, swelling in lower extremities, anasarca, dizziness, palpitations Resp: +  shortness of breath with exertion or at rest.              +  productive cough,  + non-productive cough,  No- coughing up of blood.              No-   change in color of mucus.  No- wheezing.   Skin: No-   rash or lesions. GI:  No-   heartburn, indigestion, abdominal pain, nausea, vomiting, GU: No-   dysuria,  MS:  No-   joint pain or swelling.   Neuro-     nothing unusual Psych:  No- change in mood or affect. No depression or anxiety.  No memory loss.  OBJ- Physical Exam General- Alert, Oriented, Affect-appropriate, Distress- none acute, overweight Skin- diffuse spotting- nevi or vitelligo Lymphadenopathy- none Head- atraumatic            Eyes- Gross vision intact, PERRLA, conjunctivae and secretions clear            Ears- Hearing, canals-normal            Nose- Clear, no-Septal dev, + sniffing, +mucus, polyps, erosion, perforation             Throat- Mallampati III- s/p UPPP , mucosa clear , drainage- none, tonsils- atrophic Neck- flexible , trachea midline, no stridor , thyroid nl, carotid no bruit Chest - symmetrical excursion , unlabored           Heart/CV- RRR , no murmur , no gallop  , no rub, nl s1 s2                           -  JVD- none , edema- none, stasis changes- none, varices- none           Lung- clear to P&A, wheeze- none, cough with deep breath , dullness-none, rub- none           Chest wall-  Abd-  Br/ Gen/ Rectal- Not done, not indicated Extrem- cyanosis- none, clubbing, none, atrophy- none, strength- nl Neuro- grossly intact to observation

## 2012-12-15 NOTE — Patient Instructions (Signed)
We can continue CPAP 15/ [facility]  For your eyes, try otc artificial tears like Genteal or Sustane  Flu vax  Please call as needed

## 2012-12-31 NOTE — Assessment & Plan Note (Signed)
Adequate control. 

## 2012-12-31 NOTE — Assessment & Plan Note (Signed)
Good compliance and control 

## 2013-02-06 ENCOUNTER — Ambulatory Visit: Payer: Self-pay | Admitting: Podiatry

## 2013-02-21 ENCOUNTER — Emergency Department (HOSPITAL_COMMUNITY): Payer: Medicare Other

## 2013-02-21 ENCOUNTER — Emergency Department (HOSPITAL_COMMUNITY)
Admission: EM | Admit: 2013-02-21 | Discharge: 2013-02-21 | Disposition: A | Discharge status: Home / Self Care | Payer: Medicare Other | Attending: Emergency Medicine | Admitting: Emergency Medicine

## 2013-02-21 ENCOUNTER — Encounter (HOSPITAL_COMMUNITY): Payer: Self-pay | Admitting: Emergency Medicine

## 2013-02-21 DIAGNOSIS — M25562 Pain in left knee: Secondary | ICD-10-CM

## 2013-02-21 DIAGNOSIS — IMO0002 Reserved for concepts with insufficient information to code with codable children: Secondary | ICD-10-CM | POA: Insufficient documentation

## 2013-02-21 DIAGNOSIS — Z79899 Other long term (current) drug therapy: Secondary | ICD-10-CM | POA: Insufficient documentation

## 2013-02-21 DIAGNOSIS — S8990XA Unspecified injury of unspecified lower leg, initial encounter: Secondary | ICD-10-CM | POA: Insufficient documentation

## 2013-02-21 DIAGNOSIS — Y929 Unspecified place or not applicable: Secondary | ICD-10-CM | POA: Insufficient documentation

## 2013-02-21 DIAGNOSIS — Z8709 Personal history of other diseases of the respiratory system: Secondary | ICD-10-CM | POA: Insufficient documentation

## 2013-02-21 DIAGNOSIS — W010XXA Fall on same level from slipping, tripping and stumbling without subsequent striking against object, initial encounter: Secondary | ICD-10-CM | POA: Insufficient documentation

## 2013-02-21 DIAGNOSIS — Z8669 Personal history of other diseases of the nervous system and sense organs: Secondary | ICD-10-CM | POA: Insufficient documentation

## 2013-02-21 DIAGNOSIS — I10 Essential (primary) hypertension: Secondary | ICD-10-CM | POA: Insufficient documentation

## 2013-02-21 DIAGNOSIS — E7800 Pure hypercholesterolemia, unspecified: Secondary | ICD-10-CM | POA: Insufficient documentation

## 2013-02-21 DIAGNOSIS — Z87891 Personal history of nicotine dependence: Secondary | ICD-10-CM | POA: Insufficient documentation

## 2013-02-21 DIAGNOSIS — K219 Gastro-esophageal reflux disease without esophagitis: Secondary | ICD-10-CM | POA: Insufficient documentation

## 2013-02-21 DIAGNOSIS — Z7982 Long term (current) use of aspirin: Secondary | ICD-10-CM | POA: Insufficient documentation

## 2013-02-21 DIAGNOSIS — Y939 Activity, unspecified: Secondary | ICD-10-CM | POA: Insufficient documentation

## 2013-02-21 DIAGNOSIS — R52 Pain, unspecified: Secondary | ICD-10-CM | POA: Insufficient documentation

## 2013-02-21 MED ORDER — TRAMADOL HCL 50 MG PO TABS: 50.0000 mg | ORAL_TABLET | Freq: Four times a day (QID) | ORAL | Status: DC | PRN

## 2013-02-21 NOTE — ED Notes (Signed)
Pt c/o fall 2 weeks ago after tripping; pt c/o left knee pain and right breast pain since fall

## 2013-02-21 NOTE — ED Notes (Signed)
C/O RIGHT BREAST TENDERNESS SINCE FALL. DENIES BRUISING OR MASSES. ALSO C/O LEFT KNEE PAIN. NO DEFORMITY NOTED.

## 2013-02-21 NOTE — ED Provider Notes (Signed)
CSN: [id number]     Arrival date & time 02/21/13  1156 History  This chart was scribed for non-physician practitioner Denver Faster, PA-C working with Ward Givens, MD by Clydene Laming, ED Scribe. This patient was seen in room TR11C/TR11C and the patient's care was started at 12:36 PM.   Chief Complaint  Patient presents with  . Fall   (Consider location/radiation/quality/duration/timing/severity/associated sxs/prior Treatment) The history is provided by the patient. No language interpreter was used.   HPI Comments: Jocelyn Little is a 62 y.o. female who presents to the Emergency Department complaining of a fall that occurred two weeks ago. Pt fell forward landing on her knees and hands. Since the fall she has been experiencing front, left knee pain  Left knee pain fell 2 weeks ago, fell forward has not seen her doctor Pt is ambulatory Moving leg after being in one position causes a lot of pain  Has taken generic tylenol Does not have an ortho Pain of the right breast no drainage Not icing knee, but has been elevating genreal medicin m  Past Medical History  Diagnosis Date  . Rhinitis   . OSA (obstructive sleep apnea)   . GERD (gastroesophageal reflux disease)   . HTN (hypertension)   . High cholesterol    Past Surgical History  Procedure Laterality Date  . Uvulopalatopharyngoplasty    . Cholecystectomy    . Total abdominal hysterectomy    . Lipoma excision     Family History  Problem Relation Age of Onset  . Emphysema Father   . Rheum arthritis Father   . Lung cancer Father   . Asthma Brother   . Heart disease Mother   . Heart disease Brother   . Rheum arthritis Brother   . Diabetes Brother    History  Substance Use Topics  . Smoking status: Former Smoker -- 0.80 packs/day for 20 years    Types: Cigarettes    Quit date: 03/10/1991  . Smokeless tobacco: Not on file  . Alcohol Use: Yes     Comment: rarely   OB History   Grav Para Term Preterm Abortions  TAB SAB Ect Mult Living                 Review of Systems  Musculoskeletal: Positive for arthralgias. Negative for back pain and neck pain.  Neurological: Negative for weakness, numbness and headaches.    Allergies  Naproxen and Other  Home Medications   Current Outpatient Rx  Name  Route  Sig  Dispense  Refill  . albuterol (PROVENTIL HFA;VENTOLIN HFA) 108 (90 BASE) MCG/ACT inhaler   Inhalation   Inhale 2 puffs into the lungs every 6 (six) hours as needed for wheezing.   1 Inhaler   0   . amLODipine (NORVASC) 5 MG tablet   Oral   Take 5 mg by mouth daily.         Marland Kitchen aspirin 81 MG chewable tablet   Oral   Chew 81 mg by mouth every evening.         . escitalopram (LEXAPRO) 20 MG tablet   Oral   Take 20 mg by mouth daily.         Marland Kitchen esomeprazole (NEXIUM) 40 MG capsule   Oral   Take 40 mg by mouth daily before breakfast.         . lovastatin (MEVACOR) 20 MG tablet   Oral   Take 20 mg by mouth at bedtime.         Marland Kitchen  meloxicam (MOBIC) 15 MG tablet   Oral   Take 15 mg by mouth daily as needed for pain (and inflammation).          . metoprolol (LOPRESSOR) 50 MG tablet   Oral   Take 50 mg by mouth daily.         . mometasone-formoterol (DULERA) 100-5 MCG/ACT AERO   Inhalation   Inhale 2 puffs into the lungs 2 (two) times daily as needed. Rinse mouth         . Multiple Vitamin (MULTIVITAMIN) tablet   Oral   Take 1 tablet by mouth daily.         . Omega-3 Fatty Acids (FISH OIL) 1000 MG CAPS   Oral   Take 2 capsules by mouth daily.          Marland Kitchen oxybutynin (DITROPAN) 5 MG tablet   Oral   Take 5 mg by mouth 2 (two) times daily.         . vitamin B-12 (CYANOCOBALAMIN) 1000 MCG tablet   Oral   Take 1,000 mcg by mouth daily.          Triage Vitals:BP 143/68  Pulse 72  Temp(Src) 97.8 F (36.6 C) (Oral)  Resp 20  Ht 5\' 5"  (1.651 m)  Wt 270 lb (122.471 kg)  BMI 44.93 kg/m2  SpO2 99% Physical Exam  Nursing note and vitals  reviewed. Constitutional: She is oriented to person, place, and time. She appears well-developed and well-nourished. No distress.  HENT:  Head: Normocephalic.  Eyes: Conjunctivae are normal.  Neck: Neck supple.  Cardiovascular: Normal rate, regular rhythm and normal heart sounds.   Pulmonary/Chest: Effort normal and breath sounds normal. No respiratory distress. She has no wheezes. She has no rales.  Tenderness over nipple and areola. No bruising, swelling, masses, hematoma.   Musculoskeletal: She exhibits no edema.  Normal appearing left knee. Tender to palpation over anterior joint. Full ROM of the knee joint. Pain with full flexion and extension. Negative anterior and posterior drawer signs. No laxity or pain with medial or lateral stress.    Neurological: She is alert and oriented to person, place, and time.  Skin: Skin is warm and dry.  Psychiatric: She has a normal mood and affect. Her behavior is normal.    ED Course  Procedures (including critical care time) DIAGNOSTIC STUDIES: Oxygen Saturation is 99% on RA, normal by my interpretation.    COORDINATION OF CARE: 12:44 PM- Discussed treatment plan with pt at bedside. Pt verbalized understanding and agreement with plan.   Labs Review Labs Reviewed - No data to display Imaging Review Dg Knee Complete 4 Views Left  02/21/2013   CLINICAL DATA:  Left knee pain after fall.  EXAM: LEFT KNEE - COMPLETE 4+ VIEW  COMPARISON:  None.  FINDINGS: There is no evidence of fracture, dislocation, or joint effusion. Joint spaces are intact. Spurring of superior aspect of patella is noted. Soft tissues are unremarkable.  IMPRESSION: No acute abnormality seen in left knee.   Electronically Signed   By: Roque Lias M.D.   On: 02/21/2013 14:13    EKG Interpretation   None       MDM   1. Knee pain, acute, left    Patient after a knee injury 2 weeks ago. She has been walking on the leg and was on a cruise prior to coming in. Her knee  appears normal in the exam. X-rays negative. She does not appear to have an infected joint.  Given her Ace wrap for compression, she will follow with orthopedics Dr. Her joint is stable doubt any major ligament injury.  Filed Vitals:   02/21/13 1204  BP: 143/68  Pulse: 72  Temp: 97.8 F (36.6 C)  TempSrc: Oral  Resp: 20  Height: 5\' 5"  (1.651 m)  Weight: 270 lb (122.471 kg)  SpO2: 99%    I personally performed the services described in this documentation, which was scribed in my presence. The recorded information has been reviewed and is accurate.     Lottie Mussel, PA-C 02/21/13 1606

## 2013-02-22 NOTE — ED Provider Notes (Signed)
Medical screening examination/treatment/procedure(s) were performed by non-physician practitioner and as supervising physician I was immediately available for consultation/collaboration.  EKG Interpretation   None       Devoria Albe, MD, Armando Gang   Ward Givens, MD 02/22/13 [phone]

## 2013-05-22 ENCOUNTER — Other Ambulatory Visit: Payer: Self-pay | Admitting: Internal Medicine

## 2013-05-22 DIAGNOSIS — G4733 Obstructive sleep apnea (adult) (pediatric): Secondary | ICD-10-CM

## 2013-06-12 ENCOUNTER — Encounter: Payer: Self-pay | Admitting: Internal Medicine

## 2013-06-12 ENCOUNTER — Telehealth: Payer: Self-pay | Admitting: *Deleted

## 2013-06-12 NOTE — Telephone Encounter (Signed)
Message copied by Ronny BaconWELCHEL, KATIE C on Mon Jun 12, 2013 11:16 AM ------      Message from: Sheron NightingalePIERCE, JASON      Created: Mon Jun 12, 2013 10:55 AM      Regarding: Need MD Letter       Could you get Dr. Maple HudsonYoung to do another Medicaid letter for this pt.            It needs to say something like,"Patient has been using CPAP since 2007 and is need of replacement machine to control her OSA and continue therapy."            Please let me or Melissa know if you have any questions.  Thank you! ------

## 2013-06-12 NOTE — Telephone Encounter (Signed)
Letter done

## 2013-06-13 NOTE — Telephone Encounter (Signed)
Staff message sent to Henderson NewcomerMelissa Stenson to see if we need to fax letter or have her pick up [facility]. Barbara CowerJason responded and states to fax to [phone] and place in green folder. This has been done.

## 2013-06-29 ENCOUNTER — Other Ambulatory Visit (HOSPITAL_COMMUNITY): Payer: Self-pay | Admitting: Physician Assistant

## 2013-06-29 ENCOUNTER — Telehealth: Payer: Self-pay | Admitting: Internal Medicine

## 2013-06-29 DIAGNOSIS — Z1231 Encounter for screening mammogram for malignant neoplasm of breast: Secondary | ICD-10-CM

## 2013-06-29 NOTE — Telephone Encounter (Signed)
Will forward to CDY as an FYI 

## 2013-06-29 NOTE — Telephone Encounter (Signed)
noted 

## 2013-08-24 ENCOUNTER — Ambulatory Visit (HOSPITAL_COMMUNITY)
Admission: RE | Admit: 2013-08-24 | Discharge: 2013-08-24 | Disposition: A | Discharge status: Home / Self Care | Payer: Medicare Other | Source: Ambulatory Visit | Attending: Physician Assistant | Admitting: Physician Assistant

## 2013-08-24 ENCOUNTER — Ambulatory Visit (HOSPITAL_COMMUNITY): Payer: Medicare Other

## 2013-08-24 DIAGNOSIS — Z1231 Encounter for screening mammogram for malignant neoplasm of breast: Secondary | ICD-10-CM | POA: Insufficient documentation

## 2013-11-14 ENCOUNTER — Telehealth: Payer: Self-pay | Admitting: Internal Medicine

## 2013-11-14 DIAGNOSIS — R040 Epistaxis: Secondary | ICD-10-CM

## 2013-11-14 DIAGNOSIS — J329 Chronic sinusitis, unspecified: Secondary | ICD-10-CM

## 2013-11-14 NOTE — Telephone Encounter (Signed)
Pt last seen 12/2012. Called spoke with pt. She reports fprt he past few years, whenever she wakes up in the mornings, there is dark brown/rusty blood on her pillow. She is not sure if this is coming from sinus's.  She reports she is always congested, always having PND, sinus pressure. She is not taking anything for these symptoms. She would like to know what this could be coming from? Please advise CDY thanks  Allergies  Allergen Reactions  . Naproxen     Itching after taking last night   . Other Itching    Acidic foods    Current Outpatient Prescriptions on File Prior to Visit  Medication Sig Dispense Refill  . albuterol (PROVENTIL HFA;VENTOLIN HFA) 108 (90 BASE) MCG/ACT inhaler Inhale 2 puffs into the lungs every 6 (six) hours as needed for wheezing.  1 Inhaler  0  . amLODipine (NORVASC) 5 MG tablet Take 5 mg by mouth daily.      Marland Kitchen aspirin 81 MG chewable tablet Chew 81 mg by mouth every evening.      . escitalopram (LEXAPRO) 20 MG tablet Take 20 mg by mouth daily.      Marland Kitchen esomeprazole (NEXIUM) 40 MG capsule Take 40 mg by mouth daily before breakfast.      . lovastatin (MEVACOR) 20 MG tablet Take 20 mg by mouth at bedtime.      . meloxicam (MOBIC) 15 MG tablet Take 15 mg by mouth daily as needed for pain (and inflammation).       . metoprolol (LOPRESSOR) 50 MG tablet Take 50 mg by mouth daily.      . mometasone-formoterol (DULERA) 100-5 MCG/ACT AERO Inhale 2 puffs into the lungs 2 (two) times daily as needed. Rinse mouth      . Multiple Vitamin (MULTIVITAMIN) tablet Take 1 tablet by mouth daily.      . Omega-3 Fatty Acids (FISH OIL) 1000 MG CAPS Take 2 capsules by mouth daily.       Marland Kitchen oxybutynin (DITROPAN) 5 MG tablet Take 5 mg by mouth 2 (two) times daily.      . traMADol (ULTRAM) 50 MG tablet Take 1 tablet (50 mg total) by mouth every 6 (six) hours as needed.  15 tablet  0  . vitamin B-12 (CYANOCOBALAMIN) 1000 MCG tablet Take 1,000 mcg by mouth daily.       No current  facility-administered medications on file prior to visit.

## 2013-11-14 NOTE — Telephone Encounter (Signed)
Called pt. She would like to see ENT. She does not have ENT doc, so would like a referral. Please advise if okay to do so? thanks

## 2013-11-14 NOTE — Telephone Encounter (Signed)
Referral has been placed. Please advise PCC's thanks

## 2013-11-14 NOTE — Telephone Encounter (Signed)
Hard to tell if from nose or mouth, but sinus discomfort is a clue. We can see her here, or she can go to ENT.

## 2013-11-14 NOTE — Telephone Encounter (Signed)
Ok to refer to [facility] ENT for question of sinusitis/ epistaxis

## 2013-12-15 ENCOUNTER — Ambulatory Visit: Payer: Medicare Other | Admitting: Internal Medicine

## 2014-01-22 ENCOUNTER — Ambulatory Visit: Payer: Medicare Other | Admitting: *Deleted

## 2014-01-22 ENCOUNTER — Encounter (INDEPENDENT_AMBULATORY_CARE_PROVIDER_SITE_OTHER): Payer: Self-pay

## 2014-01-22 ENCOUNTER — Ambulatory Visit (INDEPENDENT_AMBULATORY_CARE_PROVIDER_SITE_OTHER): Payer: Medicaid Other | Admitting: Internal Medicine

## 2014-01-22 ENCOUNTER — Encounter: Payer: Self-pay | Admitting: Internal Medicine

## 2014-01-22 VITALS — BP 120/78 | HR 61 | Ht 65.0 in | Wt 265.0 lb

## 2014-01-22 DIAGNOSIS — Z23 Encounter for immunization: Secondary | ICD-10-CM

## 2014-01-22 DIAGNOSIS — J3089 Other allergic rhinitis: Secondary | ICD-10-CM

## 2014-01-22 DIAGNOSIS — J309 Allergic rhinitis, unspecified: Secondary | ICD-10-CM

## 2014-01-22 DIAGNOSIS — G4733 Obstructive sleep apnea (adult) (pediatric): Secondary | ICD-10-CM

## 2014-01-22 DIAGNOSIS — J302 Other seasonal allergic rhinitis: Secondary | ICD-10-CM

## 2014-01-22 NOTE — Progress Notes (Signed)
05/15/11 60 yoF former smoker followed for OSA/ UPPP/CPAP, allergic rhinitis, complicated by HBP, GERD LOV-05/16/09 daughter here Continues CPAP from [facility] but wants to change home care companies because of service. Using a nasal mask and humidifier. Able to use it all night every night routinely. Describes perennial rhinitis with nasal congestion, sneezing, drainage. Prescription nasal sprays in the past have helped only briefly. Chest has been comfortably controlled with a little wheeze during the winter only, possibly with a cold.  08/15/12- 60 yoF former smoker followed for OSA/ UPPP/CPAP, allergic rhinitis, complicated by HBP, GERD FOLLOWS FOR: went to ED Aug 06, 2012; having SOB, cough, wheezing as well-was put on Pred taper; feeling better now but told to follow up with CY. Blames pollen for exacerbation- ED visit. Finished a prednisone taper. Feels well today except persistent cough with white sputum. CXR 08/06/12 IMPRESSION:  No active cardiopulmonary disease.  Original Report Authenticated By: Myles RosenthalJohn Stahl, M.D  12/15/12- [age] yoF former smoker followed for OSA/ UPPP/CPAP, allergic rhinitis, complicated by HBP, GERD Breathing is unchanged.  Has occas SOB and has a cough - mostly dry.   Wearing cpap every night for approx 7-8 hours.  No problems with mask or pressure. Likes CPAP 15/Advanced. Nasal mask works well. Occasional mild shortness of breath with dry cough-not concerned. Eyes dry and itch. CXR 08/06/12 IMPRESSION:  No active cardiopulmonary disease.  Original Report Authenticated By: Myles RosenthalJohn Stahl, M.D.  01/22/14- [age] yoF former smoker followed for OSA/ UPPP/CPAP, allergic rhinitis, complicated by HBP, GERD FOLLOWS FOR: Wears CPAP 15/ Advanced  every night through [facility] Pressure seems comfortable. Describes good compliance and control. Little nasal congestion recently  ROS-see HPI Constitutional:   No-   weight loss,  night sweats, fevers, chills, fatigue, lassitude. HEENT:   No-  headaches, difficulty swallowing, tooth/dental problems, sore throat,       No- sneezing, itching, ear ache, + nasal congestion, post nasal drip,  CV:  No-   chest pain, orthopnea, PND, swelling in lower extremities, anasarca, dizziness, palpitations Resp: + shortness of breath with exertion or at rest.              +  productive cough,  + non-productive cough,  No- coughing up of blood.              No-   change in color of mucus.  No- wheezing.   Skin: No-   rash or lesions. GI:  No-   heartburn, indigestion, abdominal pain, nausea, vomiting, GU:  MS:  No-   joint pain or swelling.   Neuro-     nothing unusual Psych:  No- change in mood or affect. No depression or anxiety.  No memory loss.  OBJ- Physical Exam General- Alert, Oriented, Affect-appropriate, Distress- none acute, + overweight Skin- diffuse spotting- nevi or vitelligo Lymphadenopathy- none Head- atraumatic            Eyes- Gross vision intact, PERRLA, conjunctivae and secretions clear            Ears- Hearing, canals-normal            Nose- Clear, no-Septal dev, + sniffing, +mucus, polyps, erosion,                       perforation             Throat- Mallampati III- s/p UPPP , mucosa clear , drainage- none, tonsils-  atrophic Neck- flexible , trachea midline, no stridor , thyroid nl, carotid no bruit Chest - symmetrical excursion , unlabored           Heart/CV- RRR , no murmur , no gallop  , no rub, nl s1 s2                           - JVD- none , edema- none, stasis changes- none, varices- none           Lung- clear to P&A, wheeze- none, cough with deep breath ,                      dullness-none, rub- none           Chest wall-  Abd-  Br/ Gen/ Rectal- Not done, not indicated Extrem- cyanosis- none, clubbing, none, atrophy- none, strength- nl Neuro- grossly intact to observation

## 2014-01-22 NOTE — Patient Instructions (Signed)
We can continue CPAP 15/ [facility]  Flu vax  Order- DME [facility]- replacement CPAP mask of choice and supplies  Dx OSA             Requesting download for pressure compliance

## 2014-01-29 ENCOUNTER — Encounter: Payer: Self-pay | Admitting: Neurology

## 2014-01-29 ENCOUNTER — Ambulatory Visit (INDEPENDENT_AMBULATORY_CARE_PROVIDER_SITE_OTHER): Payer: Medicare Other | Admitting: Neurology

## 2014-01-29 VITALS — BP 118/84 | HR 68 | Ht 65.0 in | Wt 262.1 lb

## 2014-01-29 DIAGNOSIS — G5791 Unspecified mononeuropathy of right lower limb: Secondary | ICD-10-CM

## 2014-01-29 DIAGNOSIS — G5711 Meralgia paresthetica, right lower limb: Secondary | ICD-10-CM

## 2014-01-29 DIAGNOSIS — M792 Neuralgia and neuritis, unspecified: Secondary | ICD-10-CM

## 2014-01-29 MED ORDER — LIDOCAINE 5 % EX OINT: 1.0000 "application " | TOPICAL_OINTMENT | Freq: Three times a day (TID) | CUTANEOUS | Status: DC | PRN

## 2014-01-29 NOTE — Patient Instructions (Addendum)
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 Management is conservative with avoidance of repetitive trauma to this region and weight loss.  1. Start lidocaine ointment 2. Avoid tight fitting clothing or belts  3. Weight loss encouraged 4. Start water exercises 5. Return to clinic as needed

## 2014-01-29 NOTE — Progress Notes (Signed)
Note faxed.

## 2014-01-29 NOTE — Progress Notes (Signed)
[facility] Clinic Note - Initial Visit   Date: 01/29/2014  Jocelyn Little MRN: [id number] DOB: December 27, 1950   Dear Dr. Mayford KnifeWilliams:  Thank you for your kind referral of Jocelyn Little for consultation of right thigh pain. Although her history is well known to you, please allow us to reiterate it for the purpose of our medical record. The patient was accompanied to the clinic by grandson who also provides collateral information.     History of Present Illness: Jocelyn Little is a 63 y.o. right-handed African American female with diabetes mellitus, hypertension, GERD, hyperlipidemia, depression and obesity presenting for evaluation of right thigh pain.  Starting ~2005, she developed numbness over the right upper thigh which was previously only numb, but now it has sharp knife-like pain.  Pain does not radiate past her knee, medial thigh, or posterior thigh.  She endorses mild weakness of the right leg and complains of unsteadiness.  No falls or similar symptoms on the right leg.  She was told it was coming from the back.  Pain is worse with prolonged walking and when laying flat.  She denies wearing tight constrictive clothing, but does endorse to gaining 40lb over the past several years.  She has very mild and occasional spells of tingling of the toes.      Out-side paper records, electronic medical record, and images have been reviewed where available and summarized as:  XR lumbar spine 04/20/2012: No acute findings identified. Labs 09/01/2012:  Na 141, K 4.1, Chl 103, Cr 1.0, Ca 1.12  Past Medical History  Diagnosis Date  . Rhinitis   . OSA (obstructive sleep apnea)   . GERD (gastroesophageal reflux disease)   . HTN (hypertension)   . High cholesterol     Past Surgical History  Procedure Laterality Date  . Uvulopalatopharyngoplasty    . Cholecystectomy    . Total abdominal hysterectomy    . Lipoma excision       Medications:  Current  Outpatient Prescriptions on File Prior to Visit  Medication Sig Dispense Refill  . albuterol (PROVENTIL HFA;VENTOLIN HFA) 108 (90 BASE) MCG/ACT inhaler Inhale 2 puffs into the lungs every 6 (six) hours as needed for wheezing. 1 Inhaler 0  . amLODipine (NORVASC) 5 MG tablet Take 5 mg by mouth daily.    Marland Kitchen. BRINTELLIX 10 MG TABS Take 1 tablet by mouth daily.  0  . esomeprazole (NEXIUM) 40 MG capsule Take 40 mg by mouth daily before breakfast.    . fluticasone (FLONASE) 50 MCG/ACT nasal spray   1  . lovastatin (MEVACOR) 20 MG tablet Take 20 mg by mouth at bedtime.    Marland Kitchen. LYRICA 50 MG capsule Take 1 tablet by mouth 3 (three) times daily.  0  . metFORMIN (GLUCOPHAGE) 500 MG tablet Take 1 tablet by mouth daily.  0  . metoprolol (LOPRESSOR) 50 MG tablet Take 50 mg by mouth daily.    . mometasone-formoterol (DULERA) 100-5 MCG/ACT AERO Inhale 2 puffs into the lungs 2 (two) times daily as needed. Rinse mouth    . Multiple Vitamin (MULTIVITAMIN) tablet Take 1 tablet by mouth daily.    . Omega-3 Fatty Acids (FISH OIL) 1000 MG CAPS Take 2 capsules by mouth daily.     . RESTASIS 0.05 % ophthalmic emulsion Place 2 drops into both eyes 2 (two) times daily.  0  . VESICARE 5 MG tablet Take 1 tablet by mouth daily.  0  . vitamin B-12 (CYANOCOBALAMIN) 1000 MCG tablet Take 1,000 mcg  by mouth daily.     No current facility-administered medications on file prior to visit.    Allergies:  Allergies  Allergen Reactions  . Naproxen     Itching after taking last night   . Other Itching    Acidic foods    Family History: Family History  Problem Relation Age of Onset  . Emphysema Father   . Rheum arthritis Father   . Lung cancer Father   . Asthma Brother   . Heart disease Mother   . Heart disease Brother   . Rheum arthritis Brother   . Diabetes Brother     Social History: History   Social History  . Marital Status: Married    Spouse Name: N/A    Number of Children: N/A  . Years of Education: N/A    Occupational History  . retired Psychiatric nurse    Social History Main Topics  . Smoking status: Not on file  . Smokeless tobacco: Not on file  . Alcohol Use: 0.0 oz/week    0 Not specified per week     Comment: rarely  . Drug Use: No  . Sexual Activity: Yes    Birth Control/ Protection: Surgical   Other Topics Concern  . Not on file   Social History Narrative   Has 4 children and 3 of them live with her in a one story home.  Retired from being a Engineer, materials and a Geologist, engineering.   Education:  High school and some college.     Review of Systems:  CONSTITUTIONAL: No fevers, chills, night sweats, or weight loss.   EYES: No visual changes or eye pain ENT: No hearing changes.  No history of nose bleeds.   RESPIRATORY: No cough, wheezing and shortness of breath.   CARDIOVASCULAR: Negative for chest pain, and palpitations.   GI: Negative for abdominal discomfort, blood in stools or black stools.  No recent change in bowel habits.   GU:  No history of incontinence.   MUSCLOSKELETAL: No history of joint pain or swelling.  No myalgias.   SKIN: Negative for lesions, rash, and itching.   HEMATOLOGY/ONCOLOGY: Negative for prolonged bleeding, bruising easily, and swollen nodes.   ENDOCRINE: Negative for cold or heat intolerance, polydipsia or goiter.   PSYCH:  No depression or anxiety symptoms.   NEURO: As Above.   Vital Signs:  BP 118/84 mmHg  Pulse 68  Ht 5\' 5"  (1.651 m)  Wt 262 lb 2 oz (118.899 kg)  BMI 43.62 kg/m2  SpO2 96%   General Medical Exam:   General:  Well appearing, comfortable.   Eyes/ENT: see cranial nerve examination.   Neck: No masses appreciated.  Full range of motion without tenderness.  No carotid bruits. Respiratory:  Clear to auscultation, good air entry bilaterally.   Cardiac:  Regular rate and rhythm, no murmur.   Extremities:  No deformities, edema, or skin discoloration. Good capillary refill.   Skin:  Skin color, texture, turgor normal. No rashes or  lesions.  Neurological Exam: MENTAL STATUS including orientation to time, place, person, recent and remote memory, attention span and concentration, language, and fund of knowledge is normal.  Speech is not dysarthric.  CRANIAL NERVES: II:  No visual field defects.  Unremarkable fundi.   III-IV-VI: Pupils equal round and reactive to light.  Normal conjugate, extra-ocular eye movements in all directions of gaze.  No nystagmus.  No ptosis.   V:  Normal facial sensation.   VII:  Normal facial symmetry and  movements.  No pathologic facial reflexes.  VIII:  Normal hearing and vestibular function.   IX-X:  Normal palatal movement.   XI:  Normal shoulder shrug and head rotation.   XII:  Normal tongue strength and range of motion, no deviation or fasciculation.  MOTOR:  No atrophy, fasciculations or abnormal movements.  No pronator drift.  Tone is normal.    Right Upper Extremity:    Left Upper Extremity:    Deltoid  5/5   Deltoid  5/5   Biceps  5/5   Biceps  5/5   Triceps  5/5   Triceps  5/5   Wrist extensors  5/5   Wrist extensors  5/5   Wrist flexors  5/5   Wrist flexors  5/5   Finger extensors  5/5   Finger extensors  5/5   Finger flexors  5/5   Finger flexors  5/5   Dorsal interossei  5/5   Dorsal interossei  5/5   Abductor pollicis  5/5   Abductor pollicis  5/5   Tone (Ashworth scale)  0  Tone (Ashworth scale)  0   Right Lower Extremity:    Left Lower Extremity:    Hip flexors  5/5   Hip flexors  5/5   Hip extensors  5/5   Hip extensors  5/5   Knee flexors  5/5   Knee flexors  5/5   Knee extensors  5/5   Knee extensors  5/5   Dorsiflexors  5/5   Dorsiflexors  5/5   Plantarflexors  5/5   Plantarflexors  5/5   Toe extensors  5/5   Toe extensors  5/5   Toe flexors  5/5   Toe flexors  5/5   Tone (Ashworth scale)  0  Tone (Ashworth scale)  0   MSRs:  Right                                                                 Left brachioradialis 2+  brachioradialis 2+  biceps 2+  biceps  2+  triceps 2+  triceps 2+  patellar 2+  patellar 2+  ankle jerk 2+  ankle jerk 2+  Hoffman no  Hoffman no  plantar response down  plantar response down   SENSORY:  Reduced pin prick, temperature, and light touch over the right anterolateral tight. Otherwise, normal and symmetric perception of light touch, pinprick, vibration, and proprioception.  Romberg's sign absent.   COORDINATION/GAIT: Normal finger-to- nose-finger and heel-to-shin.  Intact rapid alternating movements bilaterally.  Able to rise from a chair without using arms.  Gait slightly wide-based due to body habitus, but stable. Tandem and stressed gait intact.    IMPRESSION: Ms. Priscille Loveless is a 63 year-old female presenting for evaluation of right thigh paresthesias.  Her examination is consistent with diminished sensation in all modalities over the anterolateral thigh on the right side. Based on history of exam, she has meralgia paresthetica an entrapment of the lateral femoral cutaneous nerve as it exits below the inguinal canal.  Underlying etiology is most likely weight gain and central obesity causing localized compression on the nerve.   Management is conservative with avoidance of repetitive trauma to this region and weight loss.  I have discussed the diagnosis, pathophysiology, management plan, and risks  and benefits of medications, and prognosis.  Less likely L2-3 radiculopathy given the localized discomfort and absence of motor deficits.    Further, no features on exam to suggest diabetic neuropathy.   PLAN/RECOMMENDATIONS:  1. Start lidocaine ointment 2. Avoid tight fitting clothing or belts  3. Weight loss encouraged 4. Start water exercises 5. Return to clinic as needed    The duration of this appointment visit was 40 minutes of face-to-face time with the patient.  Greater than 50% of this time was spent in counseling, explanation of diagnosis, planning of further management, and coordination of care.   Thank you  for allowing me to participate in patient's care.  If I can answer any additional questions, I would be pleased to do so.    Sincerely,    Lorenia Hoston K. Allena KatzPatel, DO

## 2014-02-04 NOTE — Assessment & Plan Note (Signed)
Good compliance and control at 15 CWP Plan-get download for documentation, replacement mask and supplies

## 2014-02-04 NOTE — Assessment & Plan Note (Signed)
Seasonal and perennial allergic rhinitis with current control

## 2014-04-09 ENCOUNTER — Telehealth: Payer: Self-pay | Admitting: Internal Medicine

## 2014-04-09 DIAGNOSIS — G4733 Obstructive sleep apnea (adult) (pediatric): Secondary | ICD-10-CM

## 2014-04-09 NOTE — Telephone Encounter (Signed)
[facility]. Pt is aware of order sent as well. Nothing more needed at this time.

## 2014-04-27 ENCOUNTER — Encounter: Payer: Self-pay | Admitting: Podiatry

## 2014-04-27 ENCOUNTER — Ambulatory Visit (INDEPENDENT_AMBULATORY_CARE_PROVIDER_SITE_OTHER): Payer: Medicare Other | Admitting: Podiatry

## 2014-04-27 VITALS — BP 142/69 | HR 76 | Resp 12

## 2014-04-27 DIAGNOSIS — M79676 Pain in unspecified toe(s): Secondary | ICD-10-CM

## 2014-04-27 DIAGNOSIS — B351 Tinea unguium: Secondary | ICD-10-CM | POA: Diagnosis not present

## 2014-04-27 DIAGNOSIS — L600 Ingrowing nail: Secondary | ICD-10-CM

## 2014-04-27 NOTE — Patient Instructions (Addendum)
Diabetes and Foot Care Diabetes may cause you to have problems because of poor blood supply (circulation) to your feet and legs. This may cause the skin on your feet to become thinner, break easier, and heal more slowly. Your skin may become dry, and the skin may peel and crack. You may also have nerve damage in your legs and feet causing decreased feeling in them. You may not notice minor injuries to your feet that could lead to infections or more serious problems. Taking care of your feet is one of the most important things you can do for yourself.  HOME CARE INSTRUCTIONS  Wear shoes at all times, even in the house. Do not go barefoot. Bare feet are easily injured.  Check your feet daily for blisters, cuts, and redness. If you cannot see the bottom of your feet, use a mirror or ask someone for help.  Wash your feet with warm water (do not use hot water) and mild soap. Then pat your feet and the areas between your toes until they are completely dry. Do not soak your feet as this can dry your skin.  Apply a moisturizing lotion or petroleum jelly (that does not contain alcohol and is unscented) to the skin on your feet and to dry, brittle toenails. Do not apply lotion between your toes.  Trim your toenails straight across. Do not dig under them or around the cuticle. File the edges of your nails with an emery board or nail file.  Do not cut corns or calluses or try to remove them with medicine.  Wear clean socks or stockings every day. Make sure they are not too tight. Do not wear knee-high stockings since they may decrease blood flow to your legs.  Wear shoes that fit properly and have enough cushioning. To break in new shoes, wear them for just a few hours a day. This prevents you from injuring your feet. Always look in your shoes before you put them on to be sure there are no objects inside.  Do not cross your legs. This may decrease the blood flow to your feet.  If you find a minor scrape,  cut, or break in the skin on your feet, keep it and the skin around it clean and dry. These areas may be cleansed with mild soap and water. Do not cleanse the area with peroxide, alcohol, or iodine.  When you remove an adhesive bandage, be sure not to damage the skin around it.  If you have a wound, look at it several times a day to make sure it is healing.  Do not use heating pads or hot water bottles. They may burn your skin. If you have lost feeling in your feet or legs, you may not know it is happening until it is too late.  Make sure your health care provider performs a complete foot exam at least annually or more often if you have foot problems. Report any cuts, sores, or bruises to your health care provider immediately. SEEK MEDICAL CARE IF:   You have an injury that is not healing.  You have cuts or breaks in the skin.  You have an ingrown nail.  You notice redness on your legs or feet.  You feel burning or tingling in your legs or feet.  You have pain or cramps in your legs and feet.  Your legs or feet are numb.  Your feet always feel cold. SEEK IMMEDIATE MEDICAL CARE IF:   There is increasing redness,   swelling, or pain in or around a wound.  There is a red line that goes up your leg.  Pus is coming from a wound.  You develop a fever or as directed by your health care provider.  You notice a bad smell coming from an ulcer or wound. Document Released: 02/21/2000 Document Revised: 10/26/2012 Document Reviewed: 08/02/2012 ExitCare Patient Information 2015 [employer]. This information is not intended to replace advice given to you by your health care provider. Make sure you discuss any questions you have with your health care provider.  Ingrown Toenail An ingrown toenail occurs when the sharp edge of your toenail grows into the skin. Causes of ingrown toenails include toenails clipped too far back or poorly fitting shoes. Activities involving sudden stops  (basketball, tennis) causing "toe jamming" may lead to an ingrown nail. HOME CARE INSTRUCTIONS   Soak the whole foot in warm soapy water for 20 minutes, 3 times per day.  You may lift the edge of the nail away from the sore skin by wedging a small piece of cotton under the corner of the nail. Be careful not to dig (traumatize) and cause more injury to the area.  Wear shoes that fit well. While the ingrown nail is causing problems, sandals may be beneficial.  Trim your toenails regularly and carefully. Cut your toenails straight across, not in a curve. This will prevent injury to the skin at the corners of the toenail.  Keep your feet clean and dry.  Crutches may be helpful early in treatment if walking is painful.  Antibiotics, if prescribed, should be taken as directed.  Return for a wound check in 2 days or as directed.  Only take over-the-counter or prescription medicines for pain, discomfort, or fever as directed by your caregiver. SEEK IMMEDIATE MEDICAL CARE IF:   You have a fever.  You have increasing pain, redness, swelling, or heat at the wound site.  Your toe is not better in 7 days. If conservative treatment is not successful, surgical removal of a portion or all of the nail may be necessary. MAKE SURE YOU:   Understand these instructions.  Will watch your condition.  Will get help right away if you are not doing well or get worse. Document Released: 02/21/2000 Document Revised: 05/18/2011 Document Reviewed: 02/15/2008 ExitCare Patient Information 2015 [employer]. This information is not intended to replace advice given to you by your health care provider. Make sure you discuss any questions you have with your health care provider.    

## 2014-04-27 NOTE — Progress Notes (Signed)
   Subjective:    Patient ID: Jocelyn Little, female    DOB: 09-14-1950, 64 y.o.   MRN: [id number]  HPI [age] year old female presents the office today with complaints of painful, thick, discolored toenails. She states that particular her right big toenail is tender and ingrown. She has been soaking her feet and I'll call. She denies any recent redness or drainage from the nail sites. She denies any history of injury or trauma. She states that recently her blood sugar has been high. Denies any history of ulceration, tingling or numbness her claudication symptoms. No other complaints at this time.  Review of Systems  All other systems reviewed and are negative.      Objective:   Physical Exam AAO 3, NAD DP/PT pulses palpable, CRT less than 3 seconds Protective sensation intact with Simms Weinstein monofilament, vibratory sensation intact, Achilles tendon reflex intact. Nails are hypertrophic, dystrophic, elongated, brittle, discolored 10. On the right hallux nail particular the distal medial border there is tenderness to palpation and no evidence of ingrowing. There is injected tenderness on nails 1 through 5 bilaterally. There is no surrounding erythema or drainage. No open lesions or pre-ulcerative lesions identified bilaterally. No interdigital maceration. No other areas of tenderness bilateral activities. No overlying edema, erythema, increased warmth. MMT 5/5, ROM WNL No pain with calf compression, swelling, warmth, erythema.      Assessment & Plan:  64 year old female with symptomatic onychomycosis, right hallux ingrown toenail -Treatment options both conservative and surgical were discussed with the patient include alternatives, risks, complications. -Discussed permanent nail avulsion versus slant back procedure the right medial nail border over the areas and make ingrown toenail. This time she'll to proceed with a slant back procedure. This was performed without complications. Upon  completion the patient resolution of symptoms. Discussed with her that if the symptoms recur or worsen would likely need to proceed with a partial nail avulsion with her without chemical matricectomy. -Remaining nail sharply debrided without complication/bleeding 9. -Discussed importance of daily foot inspection. -Follow-up in 3 months or sooner if any problems are to arise. In the meantime, occurs to call the office with any questions, concerns, change in symptoms.

## 2014-07-24 ENCOUNTER — Ambulatory Visit: Payer: Medicaid Other

## 2014-07-27 ENCOUNTER — Ambulatory Visit (INDEPENDENT_AMBULATORY_CARE_PROVIDER_SITE_OTHER): Payer: Medicare Other

## 2014-07-27 DIAGNOSIS — B351 Tinea unguium: Secondary | ICD-10-CM | POA: Diagnosis not present

## 2014-07-27 DIAGNOSIS — M79676 Pain in unspecified toe(s): Secondary | ICD-10-CM | POA: Diagnosis not present

## 2014-07-27 DIAGNOSIS — L600 Ingrowing nail: Secondary | ICD-10-CM

## 2014-07-27 NOTE — Progress Notes (Signed)
   Subjective:    Patient ID: Jocelyn Little, female    DOB: 01/17/1951, 64 y.o.   MRN: [id number]  HPI Patient presents here today for B/L toenail trim, being seen by Dr Stacie AcresMayer.   Review of Systems  All other systems reviewed and are negative. Complaint:  Visit Type: Patient returns to my office for continued preventative foot care services. Complaint: Patient states" my nails have grown long and thick and become painful to walk and wear shoes" Patient has been diagnosed with DM with no complications. He presents for preventative foot care services. No changes to ROS  Podiatric Exam: Vascular: dorsalis pedis and posterior tibial pulses are palpable bilateral. Capillary return is immediate. Temperature gradient is WNL. Skin turgor WNL  Sensorium: Normal Semmes Weinstein monofilament test. Normal tactile sensation bilaterally. Nail Exam: Pt has thick disfigured discolored nails with subungual debris noted bilateral entire nail hallux through fifth toenails Ulcer Exam: There is no evidence of ulcer or pre-ulcerative changes or infection. Orthopedic Exam: Muscle tone and strength are WNL. No limitations in general ROM. No crepitus or effusions noted. Foot type and digits show no abnormalities. Bony prominences are unremarkable. Skin: No Porokeratosis. No infection or ulcers  Diagnosis:  Tinea unguium, Pain in right toe, pain in left toes Needs nail sx for ingrown nail medial border right due to fungal infection  Treatment & Plan Procedures and Treatment: Consent by patient was obtained for treatment procedures. The patient understood the discussion of treatment and procedures well. All questions were answered thoroughly reviewed. Debridement of mycotic and hypertrophic toenails, 1 through 5 bilateral and clearing of subungual debris. No ulceration, no infection noted.  Return Visit-Office Procedure: Patient instructed to return to the office for a follow up visit 3 months for continued  evaluation and treatment.     Objective:   Physical Exam        Assessment & Plan:

## 2014-07-31 ENCOUNTER — Encounter: Payer: Medicaid Other | Attending: Specialist

## 2014-07-31 DIAGNOSIS — E1165 Type 2 diabetes mellitus with hyperglycemia: Secondary | ICD-10-CM

## 2014-07-31 DIAGNOSIS — E118 Type 2 diabetes mellitus with unspecified complications: Secondary | ICD-10-CM | POA: Insufficient documentation

## 2014-07-31 DIAGNOSIS — Z713 Dietary counseling and surveillance: Secondary | ICD-10-CM | POA: Insufficient documentation

## 2014-07-31 DIAGNOSIS — IMO0002 Reserved for concepts with insufficient information to code with codable children: Secondary | ICD-10-CM

## 2014-07-31 NOTE — Progress Notes (Signed)
Patient was seen on 07/31/14 for the second of a series of three diabetes self-management courses at the [facility]. This patient missed Core 1 last week, but has signed up for the next evening Core 1 Class which is in 2 days. The following learning objectives were met by the patient during this class:   Describe the role of different macronutrients on glucose  Explain how carbohydrates affect blood glucose  State what foods contain the most carbohydrates  Demonstrate carbohydrate counting  Demonstrate how to read Nutrition Facts food label  Describe effects of various fats on heart health  Describe the importance of good nutrition for health and healthy eating strategies  Describe techniques for managing your shopping, cooking and meal planning  List strategies to follow meal plan when dining out  Describe the effects of alcohol on glucose and how to use it safely  Goals:  Follow Diabetes Meal Plan as instructed  Eat 3 meals and 2 snacks, every 3-5 hrs  Limit carbohydrate intake to 45 grams carbohydrate/meal Limit carbohydrate intake to 15 grams carbohydrate/snack Add lean protein foods to meals/snacks  Monitor glucose levels as instructed by your doctor   Follow-Up Plan:  Attend Core 1 and Core 3  Work towards following your personal food plan.

## 2014-08-02 VITALS — Ht 65.5 in | Wt 260.8 lb

## 2014-08-02 DIAGNOSIS — E119 Type 2 diabetes mellitus without complications: Secondary | ICD-10-CM

## 2014-08-02 DIAGNOSIS — E118 Type 2 diabetes mellitus with unspecified complications: Secondary | ICD-10-CM | POA: Diagnosis not present

## 2014-08-02 NOTE — Progress Notes (Signed)

## 2014-08-07 DIAGNOSIS — E1165 Type 2 diabetes mellitus with hyperglycemia: Secondary | ICD-10-CM

## 2014-08-07 DIAGNOSIS — IMO0002 Reserved for concepts with insufficient information to code with codable children: Secondary | ICD-10-CM

## 2014-08-07 DIAGNOSIS — E118 Type 2 diabetes mellitus with unspecified complications: Secondary | ICD-10-CM | POA: Diagnosis not present

## 2014-08-07 NOTE — Progress Notes (Signed)
Patient was seen on 08/07/14 for the third of a series of three diabetes self-management courses at the [facility].   Janene Madeira. State the amount of activity recommended for healthy living . Describe activities suitable for individual needs . Identify ways to regularly incorporate activity into daily life . Identify barriers to activity and ways to over come these barriers  Identify diabetes medications being personally used and their primary action for lowering glucose and possible side effects . Describe role of stress on blood glucose and develop strategies to address psychosocial issues . Identify diabetes complications and ways to prevent them  Explain how to manage diabetes during illness . Evaluate success in meeting personal goal . Establish 2-3 goals that they will plan to diligently work on until they return for the  1949-month follow-up visit  Goals:   I will count my carb choices at most meals and snacks  I will be active 10 minutes or more 5 times a week  I will test my glucose at least 2 times a day, 7 days a week  Your patient has identified these potential barriers to change:  None stated  Your patient has identified their diabetes self-care support plan as  [facility] On-line Resources Plan:  Attend Core 4 in 4 months

## 2014-08-08 ENCOUNTER — Emergency Department (HOSPITAL_COMMUNITY): Payer: Medicare Other

## 2014-08-08 ENCOUNTER — Emergency Department (HOSPITAL_COMMUNITY)
Admission: EM | Admit: 2014-08-08 | Discharge: 2014-08-08 | Disposition: A | Discharge status: Home / Self Care | Payer: Medicare Other | Attending: Emergency Medicine | Admitting: Emergency Medicine

## 2014-08-08 ENCOUNTER — Encounter (HOSPITAL_COMMUNITY): Payer: Self-pay | Admitting: *Deleted

## 2014-08-08 DIAGNOSIS — I10 Essential (primary) hypertension: Secondary | ICD-10-CM | POA: Insufficient documentation

## 2014-08-08 DIAGNOSIS — Z8669 Personal history of other diseases of the nervous system and sense organs: Secondary | ICD-10-CM | POA: Insufficient documentation

## 2014-08-08 DIAGNOSIS — E780 Pure hypercholesterolemia: Secondary | ICD-10-CM | POA: Insufficient documentation

## 2014-08-08 DIAGNOSIS — E119 Type 2 diabetes mellitus without complications: Secondary | ICD-10-CM | POA: Diagnosis not present

## 2014-08-08 DIAGNOSIS — Z79899 Other long term (current) drug therapy: Secondary | ICD-10-CM | POA: Insufficient documentation

## 2014-08-08 DIAGNOSIS — K219 Gastro-esophageal reflux disease without esophagitis: Secondary | ICD-10-CM | POA: Insufficient documentation

## 2014-08-08 DIAGNOSIS — R042 Hemoptysis: Secondary | ICD-10-CM | POA: Insufficient documentation

## 2014-08-08 DIAGNOSIS — K1379 Other lesions of oral mucosa: Secondary | ICD-10-CM

## 2014-08-08 LAB — CBC
HEMATOCRIT: 36.3 % (ref 36.0–46.0)
HEMOGLOBIN: 11.9 g/dL — AB (ref 12.0–15.0)
MCH: 29.3 pg (ref 26.0–34.0)
MCHC: 32.8 g/dL (ref 30.0–36.0)
MCV: 89.4 fL (ref 78.0–100.0)
Platelets: 209 10*3/uL (ref 150–400)
RBC: 4.06 MIL/uL (ref 3.87–5.11)
RDW: 13.4 % (ref 11.5–15.5)
WBC: 5.3 10*3/uL (ref 4.0–10.5)

## 2014-08-08 NOTE — ED Notes (Signed)
Pt states that she woke this morning with blood on her pillow. Pt states that she is unsure of any cause. Pt reports seeing the dentist yesterday and recent cough as well. Pt states that she has not had any bleeding since she woke.

## 2014-08-08 NOTE — ED Provider Notes (Signed)
CSN: [id number]     Arrival date & time 08/08/14  0719 History   First MD Initiated Contact with Patient 08/08/14 [phone]     Chief Complaint  Patient presents with  . Hemoptysis      HPI Patient presents to the emergency department because she awoke with a small amount of blood and blood clot in her mouth and blood on her sheets.  She is concerned because she does not know where this came from.  She denies anterior nosebleed.  She denies shortness of breath.  She denies hemoptysis and cough.  She is not on anticoagulants.  She's had no bleeding issues or recent easy bruising.  She has no other complaints at this time.  She's had no more blood noted from her mouth.  No dental pain.   Past Medical History  Diagnosis Date  . Rhinitis   . OSA (obstructive sleep apnea)   . GERD (gastroesophageal reflux disease)   . HTN (hypertension)   . High cholesterol   . Diabetes mellitus without complication    Past Surgical History  Procedure Laterality Date  . Uvulopalatopharyngoplasty    . Cholecystectomy    . Total abdominal hysterectomy    . Lipoma excision     Family History  Problem Relation Age of Onset  . Emphysema Father   . Rheum arthritis Father   . Lung cancer Father   . Asthma Brother   . Heart disease Mother   . Heart disease Brother   . Rheum arthritis Brother   . Diabetes Brother    History  Substance Use Topics  . Smoking status: Never Smoker   . Smokeless tobacco: Not on file  . Alcohol Use: No     Comment: rarely   OB History    No data available     Review of Systems  All other systems reviewed and are negative.     Allergies  Naproxen and Other  Home Medications   Prior to Admission medications   Medication Sig Start Date End Date Taking? Authorizing Provider  albuterol (PROVENTIL HFA;VENTOLIN HFA) 108 (90 BASE) MCG/ACT inhaler Inhale 2 puffs into the lungs every 6 (six) hours as needed for wheezing. 08/06/12   Quita SkyeMichael Ghim, MD  amLODipine (NORVASC) 5  MG tablet Take 5 mg by mouth daily.    Historical Provider, MD  BRINTELLIX 10 MG TABS Take 1 tablet by mouth daily. 01/07/14   Historical Provider, MD  esomeprazole (NEXIUM) 40 MG capsule Take 40 mg by mouth daily before breakfast.    Historical Provider, MD  fluticasone (FLONASE) 50 MCG/ACT nasal spray  11/16/13   Historical Provider, MD  lidocaine (XYLOCAINE) 5 % ointment Apply 1 application topically 3 (three) times daily as needed. 01/29/14   Donika K Patel, DO  lovastatin (MEVACOR) 20 MG tablet Take 20 mg by mouth at bedtime.    Historical Provider, MD  LYRICA 50 MG capsule Take 1 tablet by mouth 3 (three) times daily. 01/05/14   Historical Provider, MD  metFORMIN (GLUCOPHAGE) 500 MG tablet Take 1 tablet by mouth daily. 11/15/13   Historical Provider, MD  metoprolol (LOPRESSOR) 50 MG tablet Take 50 mg by mouth daily. 08/06/12   Quita SkyeMichael Ghim, MD  mometasone-formoterol [facility](DULERA) 100-5 MCG/ACT AERO Inhale 2 puffs into the lungs 2 (two) times daily as needed. Rinse mouth 08/15/12   Waymon Budgelinton D Young, MD  Multiple Vitamin (MULTIVITAMIN) tablet Take 1 tablet by mouth daily.    Historical Provider, MD  Omega-3 Fatty Acids (  FISH OIL) 1000 MG CAPS Take 2 capsules by mouth daily.     Historical Provider, MD  RESTASIS 0.05 % ophthalmic emulsion Place 2 drops into both eyes 2 (two) times daily. 11/14/13   Historical Provider, MD  VESICARE 5 MG tablet Take 1 tablet by mouth daily. 01/05/14   Historical Provider, MD  vitamin B-12 (CYANOCOBALAMIN) 1000 MCG tablet Take 1,000 mcg by mouth daily.    Historical Provider, MD   BP 127/50 mmHg  Pulse 64  Temp(Src) 98.2 F (36.8 C) (Oral)  Resp 16  SpO2 96% Physical Exam  Constitutional: She is oriented to person, place, and time. She appears well-developed and well-nourished. No distress.  HENT:  Head: Normocephalic and atraumatic.  Anterior nares are normal without evidence of recent bleeding.  Posterior pharynx shows no stigmata of recent bleeding.  Dentition is  normal.  No blood in the mouth.  No obvious lesions in the mouth or the tongue or under the tongue.  Eyes: EOM are normal.  Neck: Normal range of motion.  Cardiovascular: Normal rate, regular rhythm and normal heart sounds.   Pulmonary/Chest: Effort normal and breath sounds normal.  Abdominal: Soft. She exhibits no distension. There is no tenderness.  Musculoskeletal: Normal range of motion.  Neurological: She is alert and oriented to person, place, and time.  Skin: Skin is warm and dry.  No bruising  Psychiatric: She has a normal mood and affect. Judgment normal.  Nursing note and vitals reviewed.   ED Course  Procedures (including critical care time) Labs Review Labs Reviewed  CBC - Abnormal; Notable for the following:    Hemoglobin 11.9 (*)    All other components within normal limits    Imaging Review Dg Chest 2 View  08/08/2014   CLINICAL DATA:  Hemoptysis.  EXAM: CHEST  2 VIEW  COMPARISON:  08/06/2012  FINDINGS: The cardiomediastinal silhouette is within normal limits. The lungs are well inflated and clear. There is no evidence of pleural effusion or pneumothorax. No acute osseous abnormality is identified. Right upper quadrant abdominal surgical clips are noted.  IMPRESSION: No active cardiopulmonary disease.   Electronically Signed   By: Sebastian Ache   On: 08/08/2014 08:11  I personally reviewed the imaging tests through PACS system I reviewed available ER/hospitalization records through the EMR    EKG Interpretation None      MDM   Final diagnoses:  None    I suspect this was a small posterior nosebleed that since resolved.  Chest x-ray is clear.  Her vital signs are normal.  Her hemoglobin is 11.9.  She's had no more bleeding.  She has no complaints at this time.  Patient be discharged home from the emergency department.  I've asked her to continue to be vigilant about watching for symptoms.  If she develops any recurrent bleeding to return to the emergency  department for evaluation.    Azalia Bilis, MD 08/08/14 [phone]

## 2014-08-08 NOTE — ED Notes (Signed)
Pt returned from scans. Monitored by pulse ox and bp cuff. 

## 2014-08-08 NOTE — ED Notes (Signed)
Patient transported to X-ray 

## 2014-10-12 ENCOUNTER — Other Ambulatory Visit (HOSPITAL_COMMUNITY): Payer: Self-pay | Admitting: Specialist

## 2014-10-12 DIAGNOSIS — Z1231 Encounter for screening mammogram for malignant neoplasm of breast: Secondary | ICD-10-CM

## 2014-10-19 ENCOUNTER — Ambulatory Visit (HOSPITAL_COMMUNITY): Payer: Medicare Other

## 2014-10-19 ENCOUNTER — Ambulatory Visit (HOSPITAL_COMMUNITY)
Admission: RE | Admit: 2014-10-19 | Discharge: 2014-10-19 | Disposition: A | Discharge status: Home / Self Care | Payer: Medicare Other | Source: Ambulatory Visit | Attending: Specialist | Admitting: Specialist

## 2014-10-19 DIAGNOSIS — Z1231 Encounter for screening mammogram for malignant neoplasm of breast: Secondary | ICD-10-CM | POA: Diagnosis not present

## 2014-10-25 ENCOUNTER — Ambulatory Visit (INDEPENDENT_AMBULATORY_CARE_PROVIDER_SITE_OTHER): Payer: Medicare Other | Admitting: Podiatry

## 2014-10-25 ENCOUNTER — Encounter: Payer: Self-pay | Admitting: Podiatry

## 2014-10-25 DIAGNOSIS — M79676 Pain in unspecified toe(s): Secondary | ICD-10-CM

## 2014-10-25 DIAGNOSIS — B351 Tinea unguium: Secondary | ICD-10-CM

## 2014-10-25 NOTE — Progress Notes (Signed)
Patient ID: Jocelyn Little, female   DOB: 12/15/1950, 64 y.o.   MRN: [id number] Complaint:  Visit Type: Patient returns to my office for continued preventative foot care services. Complaint: Patient states" my nails have grown long and thick and become painful to walk and wear shoes" Patient has been diagnosed with DM with no foot complications. The patient presents for preventative foot care services. No changes to ROS  Podiatric Exam: Vascular: dorsalis pedis and posterior tibial pulses are palpable bilateral. Capillary return is immediate. Temperature gradient is WNL. Skin turgor WNL  Sensorium: Normal Semmes Weinstein monofilament test. Normal tactile sensation bilaterally. Nail Exam: Pt has thick disfigured discolored nails with subungual debris noted bilateral entire nail hallux through fifth toenails Ulcer Exam: There is no evidence of ulcer or pre-ulcerative changes or infection. Orthopedic Exam: Muscle tone and strength are WNL. No limitations in general ROM. No crepitus or effusions noted. Foot type and digits show no abnormalities. Bony prominences are unremarkable. Skin: No Porokeratosis. No infection or ulcers  Diagnosis:  Onychomycosis, , Pain in right toe, pain in left toes  Treatment & Plan Procedures and Treatment: Consent by patient was obtained for treatment procedures. The patient understood the discussion of treatment and procedures well. All questions were answered thoroughly reviewed. Debridement of mycotic and hypertrophic toenails, 1 through 5 bilateral and clearing of subungual debris. No ulceration, no infection noted.  Return Visit-Office Procedure: Patient instructed to return to the office for a follow up visit 3 months for continued evaluation and treatment.

## 2014-12-05 ENCOUNTER — Ambulatory Visit: Payer: Medicare Other

## 2014-12-07 ENCOUNTER — Encounter: Payer: Self-pay | Admitting: Neurology

## 2014-12-07 ENCOUNTER — Ambulatory Visit (INDEPENDENT_AMBULATORY_CARE_PROVIDER_SITE_OTHER): Payer: Medicare Other | Admitting: Neurology

## 2014-12-07 VITALS — BP 120/66 | HR 60 | Ht 65.0 in | Wt 258.5 lb

## 2014-12-07 DIAGNOSIS — F05 Delirium due to known physiological condition: Secondary | ICD-10-CM

## 2014-12-07 NOTE — Patient Instructions (Signed)
   We will check EEG evaluation to look for seizures.

## 2014-12-07 NOTE — Progress Notes (Signed)
Reason for visit: Episodic disorientation  Referring physician: Dr. Laney Potash Jocelyn is a 64 y.o. Little  History of present illness:  Jocelyn Little is a 64 year old right-handed black Little with a lifelong history of episodes of brief confusional spells. The patient recalls having multiple events a day since she was a child. The patient believes that she may be having up to 20 or 30 such events throughout the day. She indicates that her children have learned to recognize an event, she seems to have a brief episode of a blank stare, she will have speech arrest, and then start her conversation as soon as the episode is over. She feels confused during the events, she may be confused about where to go even inside around house, or if she is out in the store. If she is driving, she may miss an exit. The patient has never had a motor vehicle accident because of these events. The patient denies any tongue biting, bowel or bladder incontinence with the episodes. The patient was seen in the emergency room on 08/08/2014 when she woke up with blood on the pillow. She indicates that she did not bite her tongue with this event. She denies any focal numbness or weakness of the face, arms, or legs. She does have a little bit of a neuropathy from her diabetes, she is on Lyrica for this. She comes to this office for an evaluation.  Past Medical History  Diagnosis Date  . Rhinitis   . OSA (obstructive sleep apnea)   . GERD (gastroesophageal reflux disease)   . HTN (hypertension)   . High cholesterol   . Diabetes mellitus without complication     Past Surgical History  Procedure Laterality Date  . Uvulopalatopharyngoplasty    . Cholecystectomy    . Total abdominal hysterectomy    . Lipoma excision      Family History  Problem Relation Age of Onset  . Emphysema Father   . Rheum arthritis Father   . Lung cancer Father   . Asthma Brother   . Heart disease Mother   .  Heart disease Brother   . Rheum arthritis Brother   . Diabetes Brother     Social history:  reports that she quit smoking about 16 years ago. Her smoking use included Cigarettes. She has a 16 pack-year smoking history. She has never used smokeless tobacco. She reports that she does not drink alcohol or use illicit drugs.  Medications:  Prior to Admission medications   Medication Sig Start Date End Date Taking? Authorizing Provider  albuterol (PROVENTIL HFA;VENTOLIN HFA) 108 (90 BASE) MCG/ACT inhaler Inhale 2 puffs into the lungs every 6 (six) hours as needed for wheezing. 08/06/12  Yes Quita Skye, MD  amLODipine (NORVASC) 5 MG tablet Take 5 mg by mouth daily.   Yes Historical Provider, MD  BRINTELLIX 10 MG TABS Take 1 tablet by mouth daily. 01/07/14  Yes Historical Provider, MD  esomeprazole (NEXIUM) 40 MG capsule Take 40 mg by mouth daily before breakfast.   Yes Historical Provider, MD  fluticasone (FLONASE) 50 MCG/ACT nasal spray  11/16/13  Yes Historical Provider, MD  lovastatin (MEVACOR) 20 MG tablet Take 20 mg by mouth at bedtime.   Yes Historical Provider, MD  LYRICA 50 MG capsule Take 1 tablet by mouth 3 (three) times daily. 01/05/14  Yes Historical Provider, MD  metFORMIN (GLUCOPHAGE) 500 MG tablet Take 1 tablet by mouth daily. 11/15/13  Yes Historical Provider, MD  metoprolol (LOPRESSOR) 50  MG tablet Take 50 mg by mouth daily. 08/06/12  Yes Quita Skye, MD  Multiple Vitamin (MULTIVITAMIN) tablet Take 1 tablet by mouth daily.   Yes Historical Provider, MD  Omega-3 Fatty Acids (FISH OIL) 1000 MG CAPS Take 2 capsules by mouth daily.    Yes Historical Provider, MD  RESTASIS 0.05 % ophthalmic emulsion Place 2 drops into both eyes 2 (two) times daily. 11/14/13  Yes Historical Provider, MD  VESICARE 5 MG tablet Take 1 tablet by mouth daily. 01/05/14  Yes Historical Provider, MD  vitamin B-12 (CYANOCOBALAMIN) 1000 MCG tablet Take 1,000 mcg by mouth daily.   Yes Historical Provider, MD        Allergies  Allergen Reactions  . Citrus Itching  . Dust Mite Extract Other (See Comments)    Sneezing, Runny nose   . Naproxen     Itching after taking last night   . Other Itching    Acidic foods    ROS:  Out of a complete 14 system review of symptoms, the patient complains only of the following symptoms, and all other reviewed systems are negative.  Blurred vision Snoring Moles Urinary incontinence Allergies Memory loss, confusion, numbness  Blood pressure 120/66, pulse 60, height  (1.651 m), weight 258 lb 8 oz (117.255 kg).  Physical Exam  General: The patient is alert and cooperative at the time of the examination. The patient is markedly obese.  Eyes: Pupils are equal, round, and reactive to light. Discs are flat bilaterally.  Neck: The neck is supple, no carotid bruits are noted.  Respiratory: The respiratory examination is clear.  Cardiovascular: The cardiovascular examination reveals a regular rate and rhythm, no obvious murmurs or rubs are noted.  Skin: Extremities are with 1+ edema below the knees.  Neurologic Exam  Mental status: The patient is alert and oriented x 3 at the time of the examination. The patient has apparent normal recent and remote memory, with an apparently normal attention span and concentration ability.  Cranial nerves: Facial symmetry is present. There is good sensation of the face to pinprick and soft touch bilaterally. The strength of the facial muscles and the muscles to head turning and shoulder shrug are normal bilaterally. Speech is well enunciated, no aphasia or dysarthria is noted. Extraocular movements are full. Visual fields are full. The tongue is midline, and the patient has symmetric elevation of the soft palate. No obvious hearing deficits are noted.  Motor: The motor testing reveals 5 over 5 strength of all 4 extremities. Good symmetric motor tone is noted throughout.  Sensory: Sensory testing is intact to pinprick,  soft touch, vibration sensation, and position sense on all 4 extremities. No evidence of extinction is noted.  Coordination: Cerebellar testing reveals good finger-nose-finger and heel-to-shin bilaterally.  Gait and station: Gait is normal. Tandem gait is normal. Romberg is negative. No drift is seen.  Reflexes: Deep tendon reflexes are symmetric and normal bilaterally. Toes are downgoing bilaterally.   Assessment/Plan:  1. Brief confusional episodes  The patient reports a lifelong history of multiple daily episodes of brief confusion that may be associated with speech arrest. The episodes last only one or 2 seconds. The patient may have absence type seizures, further evaluation is indicated. She will be sent for EEG evaluation at this time.  Marlan Palau MD 12/07/2014 5:00 PM  [facility] [address] [place], [place] [address]  Phone [phone] Fax [phone]

## 2014-12-31 ENCOUNTER — Ambulatory Visit (INDEPENDENT_AMBULATORY_CARE_PROVIDER_SITE_OTHER): Payer: Medicare Other | Admitting: Neurology

## 2014-12-31 ENCOUNTER — Telehealth: Payer: Self-pay | Admitting: Neurology

## 2014-12-31 DIAGNOSIS — R404 Transient alteration of awareness: Secondary | ICD-10-CM

## 2014-12-31 DIAGNOSIS — F05 Delirium due to known physiological condition: Secondary | ICD-10-CM

## 2014-12-31 DIAGNOSIS — R410 Disorientation, unspecified: Secondary | ICD-10-CM

## 2014-12-31 NOTE — Addendum Note (Signed)
Addended by: Stephanie AcreWILLIS, CHARLES on: 12/31/2014 06:30 PM   Modules accepted: Orders

## 2014-12-31 NOTE — Procedures (Signed)
    History:  Ms. Jocelyn Little is a 64 year old patient with a lifelong history of brief episodes of speech arrest that have occurred frequently, up to 20 or 30 events during the day. The patient will have a slight blank stare, some speech arrest with the events. The patient being evaluated for possible seizures.  This is a routine EEG. No skull defects are noted. Medications include albuterol, Norvasc, Brintellix, Nexium, Flonase, Mevacor, metformin, Lyrica, metoprolol, multivitamins, fish oil, Vesicare, and vitamin B12 supplementation.   EEG classification: Normal awake  Description of the recording: The background rhythms of this recording consists of a fairly well modulated medium amplitude alpha rhythm of 9 Hz that is reactive to eye opening and closure. As the record progresses, the patient appears to remain in the waking state throughout the recording. Photic stimulation was performed, resulting in a bilateral and symmetric photic driving response. Hyperventilation was also performed, resulting in a minimal buildup of the background rhythm activities without significant slowing seen. At no time during the recording does there appear to be evidence of spike or spike wave discharges or evidence of focal slowing. EKG monitor shows no evidence of cardiac rhythm abnormalities with a heart rate of 60.  Impression: This is a normal EEG recording in the waking state. No evidence of ictal or interictal discharges are seen.

## 2014-12-31 NOTE — Telephone Encounter (Signed)
I called the patient. The patient indicates that she does not have episodes of true speech arrest, she is having spatial disorientation episodes. At times, the things around her appear to be rotated. 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 I will check MRI evaluation the brain. I'm not clear that this is a treatable condition if it is present.

## 2014-12-31 NOTE — Telephone Encounter (Signed)
I called patient. The EEG study is normal. I would consider an in for a trial on seizure medications, the patient does not wish to consider this at this point. She will contact our office if the episodes change.

## 2014-12-31 NOTE — Telephone Encounter (Signed)
Pt called regarding her EEG. She went home and looked on-line and read an article about what she is going thru. She is not satisfied with the testing that was done. 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 Dr.Guiseppe Donna Christenaria was mentioned in the article. Pt states that she feels "lost" sometimes. For example, she left this office and went to her daughters appt. When they were checking out she looked around and did not know where she was. She wants to know if there is a cure. She states that she has been dealing with this for 50 years. Knew that Dr. Anne HahnWillis wouldn't find anything but came anyway. States you cant take medicine if you don't know what your treating. She is requesting that Dr. Anne HahnWillis reach out to the other physician for her.

## 2014-12-31 NOTE — Telephone Encounter (Signed)
I called the patient. She stated that she did not want to experiment with any drugs. I tried to explain the concept of a trial on seizure medication and how we use that to help diagnose seizures when an EEG is normal. She still declined. She believes she has DTD, as described below. She would like to speak to Dr. Anne HahnWillis about this. I advised I would ask him to give her a call.

## 2015-01-15 ENCOUNTER — Other Ambulatory Visit: Payer: Medicare Other

## 2015-01-23 ENCOUNTER — Encounter: Payer: Self-pay | Admitting: Internal Medicine

## 2015-01-23 ENCOUNTER — Ambulatory Visit (INDEPENDENT_AMBULATORY_CARE_PROVIDER_SITE_OTHER): Payer: Medicare Other | Admitting: Internal Medicine

## 2015-01-23 VITALS — BP 126/70 | HR 80 | Ht 65.0 in | Wt 258.2 lb

## 2015-01-23 DIAGNOSIS — J4520 Mild intermittent asthma, uncomplicated: Secondary | ICD-10-CM | POA: Diagnosis not present

## 2015-01-23 DIAGNOSIS — Z23 Encounter for immunization: Secondary | ICD-10-CM

## 2015-01-23 DIAGNOSIS — J302 Other seasonal allergic rhinitis: Secondary | ICD-10-CM

## 2015-01-23 DIAGNOSIS — J309 Allergic rhinitis, unspecified: Secondary | ICD-10-CM

## 2015-01-23 DIAGNOSIS — G4733 Obstructive sleep apnea (adult) (pediatric): Secondary | ICD-10-CM | POA: Diagnosis not present

## 2015-01-23 DIAGNOSIS — J3089 Other allergic rhinitis: Secondary | ICD-10-CM

## 2015-01-23 MED ORDER — FLUTICASONE PROPIONATE 50 MCG/ACT NA SUSP: NASAL | Status: DC

## 2015-01-23 MED ORDER — ALBUTEROL SULFATE HFA 108 (90 BASE) MCG/ACT IN AERS: 2.0000 | INHALATION_SPRAY | Freq: Four times a day (QID) | RESPIRATORY_TRACT | Status: DC | PRN

## 2015-01-23 NOTE — Progress Notes (Signed)
05/15/11 60 yoF former smoker followed for OSA/ UPPP/CPAP, allergic rhinitis, complicated by HBP, GERD LOV-05/16/09 daughter here Continues CPAP from [facility] but wants to change home care companies because of service. Using a nasal mask and humidifier. Able to use it all night every night routinely. Describes perennial rhinitis with nasal congestion, sneezing, drainage. Prescription nasal sprays in the past have helped only briefly. Chest has been comfortably controlled with a little wheeze during the winter only, possibly with a cold.  08/15/12- 60 yoF former smoker followed for OSA/ UPPP/CPAP, allergic rhinitis, complicated by HBP, GERD FOLLOWS FOR: went to ED Aug 06, 2012; having SOB, cough, wheezing as well-was put on Pred taper; feeling better now but told to follow up with CY. Blames pollen for exacerbation- ED visit. Finished a prednisone taper. Feels well today except persistent cough with white sputum. CXR 08/06/12 IMPRESSION:  No active cardiopulmonary disease.  Original Report Authenticated By: Myles RosenthalJohn Stahl, M.D  12/15/12- [age] yoF former smoker followed for OSA/ UPPP/CPAP, allergic rhinitis, complicated by HBP, GERD Breathing is unchanged.  Has occas SOB and has a cough - mostly dry.   Wearing cpap every night for approx 7-8 hours.  No problems with mask or pressure. Likes CPAP 15/Advanced. Nasal mask works well. Occasional mild shortness of breath with dry cough-not concerned. Eyes dry and itch. CXR 08/06/12 IMPRESSION:  No active cardiopulmonary disease.  Original Report Authenticated By: Myles RosenthalJohn Stahl, M.D.  01/22/14- [age] yoF former smoker followed for OSA/ UPPP/CPAP, allergic rhinitis, complicated by HBP, GERD FOLLOWS FOR: Wears CPAP 15/ Advanced  every night through [facility] Pressure seems comfortable. Describes good compliance and control. Little nasal congestion recently  01/23/15- 63 yoF former smoker followed for OSA/  UPPP/CPAP, allergic rhinitis, complicated by HBP, GERD CPAP 15/ Advanced FOLLOWS FOR: Pt wears CPAP every night for about 6-9 hours; DME is [facility]. Pt does not need an order for new supplies at this time.  Quality of life is definitely better with CPAP Asks refill for albuterol rescue inhaler. She denies ever being diagnosed with asthma previously. Admits occasional chest tightness and slight wheezing probably with weather and temperature changes, virus colds and possibly pollen seasons.  ROS-see HPI Constitutional:   No-   weight loss, night sweats, fevers, chills, fatigue, lassitude. HEENT:   No-  headaches, difficulty swallowing, tooth/dental problems, sore throat,       No- sneezing, itching, ear ache, + nasal congestion, post nasal drip,  CV:  No-   chest pain, orthopnea, PND, swelling in lower extremities, anasarca, dizziness, palpitations Resp: + shortness of breath with exertion or at rest.               productive cough,  + non-productive cough,  No- coughing up of blood.              No-   change in color of mucus.  No- wheezing.   Skin: No-   rash or lesions. GI:  No-   heartburn, indigestion, abdominal pain, nausea, vomiting, GU:  MS:  No-   joint pain or swelling.   Neuro-     nothing unusual Psych:  No- change in mood or affect. No depression or anxiety.  No memory loss.  OBJ- Physical Exam General- Alert, Oriented, Affect-appropriate, Distress- none acute, + overweight Skin- diffuse spotting- nevi or vitelligo Lymphadenopathy- none Head- atraumatic            Eyes- Gross vision intact,  PERRLA, conjunctivae and secretions clear            Ears- Hearing, canals-normal            Nose- Clear, no-Septal dev, + sniffing, +mucus, polyps, erosion, perforation             Throat- Mallampati III- s/p UPPP , mucosa clear , drainage- none, tonsils-  atrophic Neck- flexible , trachea midline, no stridor , thyroid nl, carotid no bruit Chest - symmetrical excursion , unlabored            Heart/CV- RRR , no murmur , no gallop  , no rub, nl s1 s2                           - JVD- none , edema- none, stasis changes- none, varices- none           Lung- clear to P&A, wheeze- none, cough with deep breath ,  dullness-none, rub- none           Chest wall-  Abd-  Br/ Gen/ Rectal- Not done, not indicated Extrem- cyanosis- none, clubbing, none, atrophy- none, strength- nl Neuro- grossly intact to observation

## 2015-01-23 NOTE — Patient Instructions (Signed)
We can contiue CPAP 15/ Advanced  Refill scripts sent for albuterol rescue inhaler and flonase nasal spray  Flu vax  Please call if we can help

## 2015-01-23 NOTE — Assessment & Plan Note (Signed)
Continued good compliance and control. Life is better with CPAP

## 2015-01-23 NOTE — Assessment & Plan Note (Signed)
Now clear Plan-prescription for rescue inhaler to hold with education done

## 2015-01-23 NOTE — Assessment & Plan Note (Signed)
Plan: Refill Flonase, flu vaccine

## 2015-05-22 ENCOUNTER — Emergency Department (HOSPITAL_COMMUNITY): Payer: Medicare Other

## 2015-05-22 ENCOUNTER — Encounter (HOSPITAL_COMMUNITY): Payer: Self-pay | Admitting: Family Medicine

## 2015-05-22 ENCOUNTER — Emergency Department (HOSPITAL_COMMUNITY)
Admission: EM | Admit: 2015-05-22 | Discharge: 2015-05-22 | Disposition: A | Discharge status: Home / Self Care | Payer: Medicare Other | Attending: Emergency Medicine | Admitting: Emergency Medicine

## 2015-05-22 DIAGNOSIS — I10 Essential (primary) hypertension: Secondary | ICD-10-CM | POA: Diagnosis not present

## 2015-05-22 DIAGNOSIS — Z7951 Long term (current) use of inhaled steroids: Secondary | ICD-10-CM | POA: Diagnosis not present

## 2015-05-22 DIAGNOSIS — E119 Type 2 diabetes mellitus without complications: Secondary | ICD-10-CM | POA: Diagnosis not present

## 2015-05-22 DIAGNOSIS — K219 Gastro-esophageal reflux disease without esophagitis: Secondary | ICD-10-CM | POA: Insufficient documentation

## 2015-05-22 DIAGNOSIS — Z7984 Long term (current) use of oral hypoglycemic drugs: Secondary | ICD-10-CM | POA: Insufficient documentation

## 2015-05-22 DIAGNOSIS — Z79899 Other long term (current) drug therapy: Secondary | ICD-10-CM | POA: Insufficient documentation

## 2015-05-22 DIAGNOSIS — G8929 Other chronic pain: Secondary | ICD-10-CM | POA: Diagnosis not present

## 2015-05-22 DIAGNOSIS — Z7982 Long term (current) use of aspirin: Secondary | ICD-10-CM | POA: Insufficient documentation

## 2015-05-22 DIAGNOSIS — R1032 Left lower quadrant pain: Secondary | ICD-10-CM | POA: Diagnosis present

## 2015-05-22 DIAGNOSIS — E7800 Pure hypercholesterolemia, unspecified: Secondary | ICD-10-CM | POA: Diagnosis not present

## 2015-05-22 DIAGNOSIS — N390 Urinary tract infection, site not specified: Secondary | ICD-10-CM

## 2015-05-22 DIAGNOSIS — M25552 Pain in left hip: Secondary | ICD-10-CM | POA: Diagnosis not present

## 2015-05-22 DIAGNOSIS — Z87891 Personal history of nicotine dependence: Secondary | ICD-10-CM | POA: Insufficient documentation

## 2015-05-22 DIAGNOSIS — A599 Trichomoniasis, unspecified: Secondary | ICD-10-CM | POA: Diagnosis not present

## 2015-05-22 LAB — CBC WITH DIFFERENTIAL/PLATELET
Basophils Absolute: 0 10*3/uL (ref 0.0–0.1)
Basophils Relative: 1 %
EOS PCT: 2 %
Eosinophils Absolute: 0.1 10*3/uL (ref 0.0–0.7)
HCT: 37.9 % (ref 36.0–46.0)
Hemoglobin: 12.3 g/dL (ref 12.0–15.0)
LYMPHS ABS: 1.6 10*3/uL (ref 0.7–4.0)
LYMPHS PCT: 30 %
MCH: 29.3 pg (ref 26.0–34.0)
MCHC: 32.5 g/dL (ref 30.0–36.0)
MCV: 90.2 fL (ref 78.0–100.0)
MONO ABS: 0.4 10*3/uL (ref 0.1–1.0)
Monocytes Relative: 8 %
Neutro Abs: 3 10*3/uL (ref 1.7–7.7)
Neutrophils Relative %: 59 %
PLATELETS: 208 10*3/uL (ref 150–400)
RBC: 4.2 MIL/uL (ref 3.87–5.11)
RDW: 13.6 % (ref 11.5–15.5)
WBC: 5.2 10*3/uL (ref 4.0–10.5)

## 2015-05-22 LAB — BASIC METABOLIC PANEL
Anion gap: 8 (ref 5–15)
BUN: 13 mg/dL (ref 6–20)
CHLORIDE: 106 mmol/L (ref 101–111)
CO2: 29 mmol/L (ref 22–32)
CREATININE: 0.9 mg/dL (ref 0.44–1.00)
Calcium: 9 mg/dL (ref 8.9–10.3)
GFR calc Af Amer: >60 mL/min (ref >60)
GFR calc non Af Amer: >60 mL/min (ref >60)
GLUCOSE: 110 mg/dL — AB (ref 65–99)
POTASSIUM: 4.2 mmol/L (ref 3.5–5.1)
Sodium: 143 mmol/L (ref 135–145)

## 2015-05-22 LAB — URINALYSIS, ROUTINE W REFLEX MICROSCOPIC
Bilirubin Urine: NEGATIVE
Glucose, UA: NEGATIVE mg/dL
HGB URINE DIPSTICK: NEGATIVE
KETONES UR: NEGATIVE mg/dL
Nitrite: NEGATIVE
PROTEIN: NEGATIVE mg/dL
Specific Gravity, Urine: 1.021 (ref 1.005–1.030)
pH: 6 (ref 5.0–8.0)

## 2015-05-22 LAB — URINE MICROSCOPIC-ADD ON

## 2015-05-22 MED ORDER — METRONIDAZOLE 500 MG PO TABS
2000.0000 mg | ORAL_TABLET | Freq: Once | ORAL | Status: AC
  Administered 2015-05-22: 2000 mg via ORAL
  Filled 2015-05-22: qty 4

## 2015-05-22 MED ORDER — HYDROCODONE-ACETAMINOPHEN 5-325 MG PO TABS: 1.0000 | ORAL_TABLET | Freq: Four times a day (QID) | ORAL | Status: DC | PRN

## 2015-05-22 MED ORDER — CEPHALEXIN 500 MG PO CAPS: 500.0000 mg | ORAL_CAPSULE | Freq: Two times a day (BID) | ORAL | Status: DC

## 2015-05-22 NOTE — Discharge Instructions (Signed)
Hip Pain Your hip is the joint between your upper legs and your lower pelvis. The bones, cartilage, tendons, and muscles of your hip joint perform a lot of work each day supporting your body weight and allowing you to move around. Hip pain can range from a minor ache to severe pain in one or both of your hips. Pain may be felt on the inside of the hip joint near the groin, or the outside near the buttocks and upper thigh. You may have swelling or stiffness as well.  HOME CARE INSTRUCTIONS   Take medicines only as directed by your health care provider.  Apply ice to the injured area:  Put ice in a plastic bag.  Place a towel between your skin and the bag.  Leave the ice on for 15-20 minutes at a time, 3-4 times a day.  Keep your leg raised (elevated) when possible to lessen swelling.  Avoid activities that cause pain.  Follow specific exercises as directed by your health care provider.  Sleep with a pillow between your legs on your most comfortable side.  Record how often you have hip pain, the location of the pain, and what it feels like. SEEK MEDICAL CARE IF:   You are unable to put weight on your leg.  Your hip is red or swollen or very tender to touch.  Your pain or swelling continues or worsens after 1 week.  You have increasing difficulty walking.  You have a fever. SEEK IMMEDIATE MEDICAL CARE IF:   You have fallen.  You have a sudden increase in pain and swelling in your hip. MAKE SURE YOU:   Understand these instructions.  Will watch your condition.  Will get help right away if you are not doing well or get worse.   This information is not intended to replace advice given to you by your health care provider. Make sure you discuss any questions you have with your health care provider.   Document Released: 08/13/2009 Document Revised: 03/16/2014 Document Reviewed: 10/20/2012 Elsevier Interactive Patient Education 2016 [employer].  Trichomoniasis Trichomoniasis is an infection caused by an organism called Trichomonas. The infection can affect both women and men. In women, the outer female genitalia and the vagina are affected. In men, the penis is mainly affected, but the prostate and other reproductive organs can also be involved. Trichomoniasis is a sexually transmitted infection (STI) and is most often passed to another person through sexual contact.  RISK FACTORS  Having unprotected sexual intercourse.  Having sexual intercourse with an infected partner. SIGNS AND SYMPTOMS  Symptoms of trichomoniasis in women include:  Abnormal gray-green frothy vaginal discharge.  Itching and irritation of the vagina.  Itching and irritation of the area outside the vagina. Symptoms of trichomoniasis in men include:   Penile discharge with or without pain.  Pain during urination. This results from inflammation of the urethra. DIAGNOSIS  Trichomoniasis may be found during a Pap test or physical exam. Your health care provider may use one of the following methods to help diagnose this infection:  Testing the pH of the vagina with a test tape.  Using a vaginal swab test that checks for the Trichomonas organism. A test is available that provides results within a few minutes.  Examining a urine sample.  Testing vaginal secretions. Your health care provider may test you for other STIs, including HIV. TREATMENT   You may be given medicine to fight the infection. Women should inform their health care provider if  they could be or are pregnant. Some medicines used to treat the infection should not be taken during pregnancy.  Your health care provider may recommend over-the-counter medicines or creams to decrease itching or irritation.  Your sexual partner will need to be treated if infected.  Your health care provider may test you for infection again 3 months after treatment. HOME CARE INSTRUCTIONS   Take medicines  only as directed by your health care provider.  Take over-the-counter medicine for itching or irritation as directed by your health care provider.  Do not have sexual intercourse while you have the infection.  Women should not douche or wear tampons while they have the infection.  Discuss your infection with your partner. Your partner may have gotten the infection from you, or you may have gotten it from your partner.  Have your sex partner get examined and treated if necessary.  Practice safe, informed, and protected sex.  See your health care provider for other STI testing. SEEK MEDICAL CARE IF:   You still have symptoms after you finish your medicine.  You develop abdominal pain.  You have pain when you urinate.  You have bleeding after sexual intercourse.  You develop a rash.  Your medicine makes you sick or makes you throw up (vomit). MAKE SURE YOU:  Understand these instructions.  Will watch your condition.  Will get help right away if you are not doing well or get worse.   This information is not intended to replace advice given to you by your health care provider. Make sure you discuss any questions you have with your health care provider.   Document Released: 08/19/2000 Document Revised: 03/16/2014 Document Reviewed: 12/05/2012 Elsevier Interactive Patient Education 2016 [employer]  Urinary Tract Infection Urinary tract infections (UTIs) can develop anywhere along your urinary tract. Your urinary tract is your body's drainage system for removing wastes and extra water. Your urinary tract includes two kidneys, two ureters, a bladder, and a urethra. Your kidneys are a pair of bean-shaped organs. Each kidney is about the size of your fist. They are located below your ribs, one on each side of your spine. CAUSES Infections are caused by microbes, which are microscopic organisms, including fungi, viruses, and bacteria. These organisms are so small that they can  only be seen through a microscope. Bacteria are the microbes that most commonly cause UTIs. SYMPTOMS  Symptoms of UTIs may vary by age and gender of the patient and by the location of the infection. Symptoms in young women typically include a frequent and intense urge to urinate and a painful, burning feeling in the bladder or urethra during urination. Older women and men are more likely to be tired, shaky, and weak and have muscle aches and abdominal pain. A fever may mean the infection is in your kidneys. Other symptoms of a kidney infection include pain in your back or sides below the ribs, nausea, and vomiting. DIAGNOSIS To diagnose a UTI, your caregiver will ask you about your symptoms. Your caregiver will also ask you to provide a urine sample. The urine sample will be tested for bacteria and white blood cells. White blood cells are made by your body to help fight infection. TREATMENT  Typically, UTIs can be treated with medication. Because most UTIs are caused by a bacterial infection, they usually can be treated with the use of antibiotics. The choice of antibiotic and length of treatment depend on your symptoms and the type of bacteria causing your infection. HOME CARE  INSTRUCTIONS  If you were prescribed antibiotics, take them exactly as your caregiver instructs you. Finish the medication even if you feel better after you have only taken some of the medication.  Drink enough water and fluids to keep your urine clear or pale yellow.  Avoid caffeine, tea, and carbonated beverages. They tend to irritate your bladder.  Empty your bladder often. Avoid holding urine for long periods of time.  Empty your bladder before and after sexual intercourse.  After a bowel movement, women should cleanse from front to back. Use each tissue only once. SEEK MEDICAL CARE IF:   You have back pain.  You develop a fever.  Your symptoms do not begin to resolve within 3 days. SEEK IMMEDIATE MEDICAL CARE  IF:   You have severe back pain or lower abdominal pain.  You develop chills.  You have nausea or vomiting.  You have continued burning or discomfort with urination. MAKE SURE YOU:   Understand these instructions.  Will watch your condition.  Will get help right away if you are not doing well or get worse.   This information is not intended to replace advice given to you by your health care provider. Make sure you discuss any questions you have with your health care provider.   Document Released: 12/03/2004 Document Revised: 11/14/2014 Document Reviewed: 04/03/2011 Elsevier Interactive Patient Education [employer]
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
## 2015-05-22 NOTE — ED Notes (Signed)
Pt here for left hip pain x a few weeks. sts for the past week she has had groin swelling and she can feel a hard knot in her vaginal area and when she wipes there is blood.

## 2015-05-23 LAB — HIV ANTIBODY (ROUTINE TESTING W REFLEX): HIV SCREEN 4TH GENERATION: NONREACTIVE

## 2015-05-23 LAB — URINE CULTURE: Culture: >=100000

## 2015-05-23 LAB — GC/CHLAMYDIA PROBE AMP (~~LOC~~) NOT AT ARMC
CHLAMYDIA, DNA PROBE: NEGATIVE
Neisseria Gonorrhea: NEGATIVE

## 2015-05-23 LAB — RPR: RPR: REACTIVE — AB

## 2015-05-23 LAB — RPR, QUANT+TP ABS (REFLEX)
Rapid Plasma Reagin, Quant: 1:1 {titer} — ABNORMAL HIGH
T Pallidum Abs: POSITIVE — AB

## 2015-05-24 ENCOUNTER — Telehealth: Payer: Self-pay | Admitting: *Deleted

## 2015-05-24 NOTE — ED Notes (Signed)
(+)  urine culture, treated with Cephalexin, OK per Meagan Decher, PharmD 

## 2015-05-26 ENCOUNTER — Telehealth (HOSPITAL_BASED_OUTPATIENT_CLINIC_OR_DEPARTMENT_OTHER): Payer: Self-pay | Admitting: Emergency Medicine

## 2015-05-27 ENCOUNTER — Telehealth: Payer: Self-pay | Admitting: Emergency Medicine

## 2015-05-28 ENCOUNTER — Emergency Department (INDEPENDENT_AMBULATORY_CARE_PROVIDER_SITE_OTHER)
Admission: EM | Admit: 2015-05-28 | Discharge: 2015-05-28 | Disposition: A | Discharge status: Home / Self Care | Payer: Medicare Other | Source: Home / Self Care | Attending: Family Medicine | Admitting: Family Medicine

## 2015-05-28 ENCOUNTER — Encounter (HOSPITAL_COMMUNITY): Payer: Self-pay | Admitting: *Deleted

## 2015-05-28 DIAGNOSIS — A539 Syphilis, unspecified: Secondary | ICD-10-CM

## 2015-05-28 MED ORDER — PENICILLIN G BENZATHINE 1200000 UNIT/2ML IM SUSP
2.4000 10*6.[IU] | Freq: Once | INTRAMUSCULAR | Status: AC
  Administered 2015-05-28: 2.4 10*6.[IU] via INTRAMUSCULAR

## 2015-05-28 MED ORDER — PENICILLIN G BENZATHINE 1200000 UNIT/2ML IM SUSP
INTRAMUSCULAR | Status: AC
  Filled 2015-05-28: qty 2

## 2015-05-28 NOTE — Discharge Instructions (Signed)
Syphilis You have been treated with penicillin which is the cure for syphilis  You do not have to take any other medication Syphilis is an infectious disease. It can cause serious complications if left untreated.  CAUSES  Syphilis is caused by a type of bacteria called Treponema pallidum. It is most commonly spread through sexual contact. Syphilis may also spread to a fetus through the blood of the mother.  SIGNS AND SYMPTOMS Symptoms vary depending on the stage of the disease. Some symptoms may disappear without treatment. However, this does not mean that the infection is gone. One form of syphilis (called latent syphilis) has no symptoms.  Primary Syphilis  Painless sores (chancres) in and around the genital organs and mouth.  Swollen lymph nodes near the sores. Secondary Syphilis  A rash or sores over any portion of the body, including the palms of the hands and soles of the feet.  Fever.  Headache.  Sore throat.  Swollen lymph nodes.  New sores in the mouth or on the genitals.  Feeling generally ill.  Having pain in the joints. Tertiary Syphilis The third stage of syphilis involves severe damage to different organs in the body, such as the brain, spinal cord, and heart. Signs and symptoms may include:   Dementia.  Personality and mood changes.  Difficulty walking.  Heart failure.  Fainting.  Enlargement (aneurysm) of the aorta.  Tumors of the skin, bones, or liver.  Muscle weakness.  Sudden "lightning" pains, numbness, or tingling.  Problems with coordination.  Vision changes. DIAGNOSIS   A physical exam will be done.  Blood tests will be done to confirm the diagnosis.  If the disease is in the first or second stages, a fluid (drainage) sample from a sore or rash may be examined under a microscope to detect the disease-causing bacteria.  Fluid around the spine may need to be examined to detect brain damage or inflammation of the brain lining  (meningitis).  If the disease is in the third stage, X-rays, CT scans, MRIs, echocardiograms, ultrasounds, or cardiac catheterization may also be done to detect disease of the heart, aorta, or brain. TREATMENT  Syphilis can be cured with antibiotic medicine if a diagnosis is made early. During the first day of treatment, you may experience fever, chills, headache, nausea, or aching all over your body. This is a normal reaction to the antibiotics.  HOME CARE INSTRUCTIONS   Take your antibiotic medicine as directed by your health care provider. Finish the antibiotic even if you start to feel better. Incomplete treatment will put you at risk for continued infection and could be life threatening.  Take medicines only as directed by your health care provider.  Do not have sexual intercourse until your treatment is completed or as directed by your health care provider.  Inform your recent sexual partners that you were diagnosed with syphilis. They need to seek care and treatment, even if they have no symptoms. It is necessary that all your sexual partners be tested for infection and treated if they have the disease.  Keep all follow-up visits as directed by your health care provider. It is important to keep all your appointments.  If your test results are not ready during your visit, make an appointment with your health care provider to find out the results. Do not assume everything is normal if you have not heard from your health care provider or the medical facility. It is your responsibility to get your test results. SEEK MEDICAL CARE  IF:  You continue to have any of the following 24 hours after beginning treatment:  Fever.  Chills.  Headache.  Nausea.  Aching all over your body.  You have symptoms of an allergic reaction to medicine, such as:  Chills.  A headache.  Light-headedness.  A new rash (especially hives).  Difficulty breathing. MAKE SURE YOU:   Understand these  instructions.  Will watch your condition.  Will get help right away if you are not doing well or get worse.   This information is not intended to replace advice given to you by your health care provider. Make sure you discuss any questions you have with your health care provider.   Document Released: 12/14/2012 Document Revised: 03/16/2014 Document Reviewed: 12/14/2012 Elsevier Interactive Patient Education [employer]

## 2015-05-28 NOTE — ED Provider Notes (Signed)
CSN: [id number]     Arrival date & time 05/28/15  1301 History   First MD Initiated Contact with Patient 05/28/15 1319     Chief Complaint  Patient presents with  . Abdominal Pain  . Exposure to STD   (Consider location/radiation/quality/duration/timing/severity/associated sxs/prior Treatment) HPI  Patient states she was called from ER that she has a positive RPR.  Is here for treatment.  Past Medical History  Diagnosis Date  . Rhinitis   . OSA (obstructive sleep apnea)   . GERD (gastroesophageal reflux disease)   . HTN (hypertension)   . High cholesterol   . Diabetes mellitus without complication [facility])    Past Surgical History  Procedure Laterality Date  . Uvulopalatopharyngoplasty    . Cholecystectomy    . Total abdominal hysterectomy    . Lipoma excision     Family History  Problem Relation Age of Onset  . Emphysema Father   . Rheum arthritis Father   . Lung cancer Father   . Asthma Brother   . Heart disease Mother   . Heart disease Brother   . Rheum arthritis Brother   . Diabetes Brother    Social History  Substance Use Topics  . Smoking status: Former Smoker -- 0.80 packs/day for 20 years    Types: Cigarettes    Quit date: 03/09/1998  . Smokeless tobacco: Never Used  . Alcohol Use: No     Comment: rarely   OB History    No data available     Review of Systems Abdominal pain Allergies  Citrus; Dust mite extract; Naproxen; and Other  Home Medications   Prior to Admission medications   Medication Sig Start Date End Date Taking? Authorizing Provider  albuterol (PROVENTIL HFA;VENTOLIN HFA) 108 (90 BASE) MCG/ACT inhaler Inhale 2 puffs into the lungs every 6 (six) hours as needed for wheezing. 01/23/15   Waymon Budge, MD  amLODipine (NORVASC) 5 MG tablet Take 5 mg by mouth daily.    Historical Provider, MD  aspirin 81 MG tablet Take 81 mg by mouth daily.    Historical Provider, MD  BRINTELLIX 10 MG TABS Take 1 tablet by mouth daily. 01/07/14    Historical Provider, MD  cephALEXin (KEFLEX) 500 MG capsule Take 1 capsule (500 mg total) by mouth 2 (two) times daily. 05/22/15   Tilden Fossa, MD  Cyanocobalamin (B12 LIQUID HEALTH BOOSTER PO) Take 1 Bottle by mouth every other day.     Historical Provider, MD  esomeprazole (NEXIUM) 40 MG capsule Take 40 mg by mouth as needed (for indigestion).     Historical Provider, MD  fluticasone (FLONASE) 50 MCG/ACT nasal spray 2 puffs each nostril once or twic daily when needed Patient taking differently: Place 2 sprays into both nostrils at bedtime as needed for rhinitis.  01/23/15   Waymon Budge, MD  HYDROcodone-acetaminophen (NORCO/VICODIN) 5-325 MG tablet Take 1 tablet by mouth every 6 (six) hours as needed. 05/22/15   Tilden Fossa, MD  lovastatin (MEVACOR) 20 MG tablet Take 20 mg by mouth at bedtime.    Historical Provider, MD  metFORMIN (GLUCOPHAGE) 500 MG tablet Take 1 tablet by mouth daily. 11/15/13   Historical Provider, MD  metoprolol (LOPRESSOR) 50 MG tablet Take 50 mg by mouth daily. 08/06/12   Quita Skye, MD  Multiple Vitamin (MULTIVITAMIN) tablet Take 1 tablet by mouth every other day.     Historical Provider, MD  RESTASIS 0.05 % ophthalmic emulsion Place 2 drops into both eyes 2 (two) times  daily. 11/14/13   Historical Provider, MD   Meds Ordered and Administered this Visit  Medications - No data to display  BP 140/89 mmHg  Pulse 80  Temp(Src) 98 F (36.7 C) (Oral)  Resp 18  SpO2 99% No data found.   Physical Exam  Constitutional: She is oriented to person, place, and time. She appears well-developed and well-nourished.  HENT:  Head: Normocephalic and atraumatic.  Eyes: Conjunctivae are normal.  Pulmonary/Chest: Effort normal.  Abdominal: Soft. Bowel sounds are normal. There is no tenderness.  Genitourinary:  Not performed  Musculoskeletal: Normal range of motion.  Neurological: She is alert and oriented to person, place, and time.  Skin: Skin is warm and dry.   Psychiatric: She has a normal mood and affect. Her behavior is normal.  Nursing note and vitals reviewed.     ED Course  Procedures (including critical care time)  Labs Review Labs Reviewed - No data to display  Imaging Review No results found.   Visual Acuity Review  Right Eye Distance:   Left Eye Distance:   Bilateral Distance:    Right Eye Near:   Left Eye Near:    Bilateral Near:      Old records reviewed Tx with flagyl Neg chlamydia/GC +'ve syphilis    MDM   1. 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 Patient is advised to continue home symptomatic treatment. Patient is advised that if there are new or worsening symptoms to attend the emergency department, contact primary care provider, or return to UC. Instructions of care provided discharged home in stable condition. Return to work/school note provided.   THIS NOTE WAS GENERATED USING A VOICE RECOGNITION SOFTWARE PROGRAM. ALL REASONABLE EFFORTS  WERE MADE TO PROOFREAD THIS DOCUMENT FOR ACCURACY.  I have verbally reviewed the discharge instructions with the patient. A printed AVS was given to the patient.  All questions were answered prior to discharge.      Tharon AquasFrank C Patrick, PA 05/28/15 1427

## 2015-05-28 NOTE — ED Notes (Signed)
Patient reports left lower quadrant pain x 2 weeks, intermittent in nature. No UTI symptoms. Patient reports exposure to STD, is on cephalexin and was called today to state she needed Bicillin for +RPR.

## 2015-07-04 ENCOUNTER — Other Ambulatory Visit: Payer: Self-pay

## 2015-07-04 DIAGNOSIS — Z1231 Encounter for screening mammogram for malignant neoplasm of breast: Secondary | ICD-10-CM

## 2015-10-21 ENCOUNTER — Ambulatory Visit: Payer: Medicare Other

## 2015-10-21 ENCOUNTER — Ambulatory Visit (INDEPENDENT_AMBULATORY_CARE_PROVIDER_SITE_OTHER): Payer: Medicare Other

## 2015-10-21 ENCOUNTER — Encounter: Payer: Self-pay | Admitting: Physician Assistant

## 2015-10-21 ENCOUNTER — Ambulatory Visit (INDEPENDENT_AMBULATORY_CARE_PROVIDER_SITE_OTHER): Payer: Medicare Other | Admitting: Family Medicine

## 2015-10-21 VITALS — BP 118/82 | HR 70 | Temp 98.4°F | Resp 17 | Ht 65.0 in | Wt 259.0 lb

## 2015-10-21 DIAGNOSIS — M1610 Unilateral primary osteoarthritis, unspecified hip: Secondary | ICD-10-CM

## 2015-10-21 DIAGNOSIS — M25552 Pain in left hip: Secondary | ICD-10-CM

## 2015-10-21 DIAGNOSIS — G8929 Other chronic pain: Secondary | ICD-10-CM

## 2015-10-21 DIAGNOSIS — M47816 Spondylosis without myelopathy or radiculopathy, lumbar region: Secondary | ICD-10-CM | POA: Diagnosis not present

## 2015-10-21 DIAGNOSIS — M5442 Lumbago with sciatica, left side: Secondary | ICD-10-CM | POA: Diagnosis not present

## 2015-10-21 DIAGNOSIS — M1990 Unspecified osteoarthritis, unspecified site: Secondary | ICD-10-CM

## 2015-10-21 DIAGNOSIS — E1140 Type 2 diabetes mellitus with diabetic neuropathy, unspecified: Secondary | ICD-10-CM

## 2015-10-21 DIAGNOSIS — G6289 Other specified polyneuropathies: Secondary | ICD-10-CM

## 2015-10-21 DIAGNOSIS — M5137 Other intervertebral disc degeneration, lumbosacral region: Secondary | ICD-10-CM | POA: Diagnosis not present

## 2015-10-21 DIAGNOSIS — M51379 Other intervertebral disc degeneration, lumbosacral region without mention of lumbar back pain or lower extremity pain: Secondary | ICD-10-CM

## 2015-10-21 LAB — COMPREHENSIVE METABOLIC PANEL
ALBUMIN: 4.1 g/dL (ref 3.6–5.1)
ALT: 19 U/L (ref 6–29)
AST: 13 U/L (ref 10–35)
Alkaline Phosphatase: 49 U/L (ref 33–130)
BILIRUBIN TOTAL: 0.3 mg/dL (ref 0.2–1.2)
BUN: 22 mg/dL (ref 7–25)
CO2: 27 mmol/L (ref 20–31)
CREATININE: 1.02 mg/dL — AB (ref 0.50–0.99)
Calcium: 9 mg/dL (ref 8.6–10.4)
Chloride: 106 mmol/L (ref 98–110)
Glucose, Bld: 80 mg/dL (ref 65–99)
Potassium: 4.3 mmol/L (ref 3.5–5.3)
SODIUM: 142 mmol/L (ref 135–146)
Total Protein: 6.9 g/dL (ref 6.1–8.1)

## 2015-10-21 LAB — GLUCOSE, POCT (MANUAL RESULT ENTRY): POC Glucose: 91 mg/dl (ref 70–99)

## 2015-10-21 LAB — POCT GLYCOSYLATED HEMOGLOBIN (HGB A1C): Hemoglobin A1C: 6.1

## 2015-10-21 MED ORDER — DICLOFENAC SODIUM 75 MG PO TBEC: 75.0000 mg | DELAYED_RELEASE_TABLET | Freq: Two times a day (BID) | ORAL | 1 refills | Status: DC

## 2015-10-21 NOTE — Patient Instructions (Addendum)
   IF you received an x-ray today, you will receive an invoice from [facility]. Please contact [facility] at [phone] with questions or concerns regarding your invoice.   IF you received labwork today, you will receive an invoice from [facility]. Please contact [facility] at [phone] with questions or concerns regarding your invoice.   Our billing staff will not be able to assist you with questions regarding bills from these companies.  You will be contacted with the lab results as soon as they are available. The fastest way to get your results is to activate your My Chart account. Instructions are located on the last page of this paperwork. If you have not heard from us regarding the results in 2 weeks, please contact this office.      Sciatica With Rehab The sciatic nerve runs from the back down the leg and is responsible for sensation and control of the muscles in the back (posterior) side of the thigh, lower leg, and foot. Sciatica is a condition that is characterized by inflammation of this nerve.  SYMPTOMS   Signs of nerve damage, including numbness and/or weakness along the posterior side of the lower extremity.  Pain in the back of the thigh that may also travel down the leg.  Pain that worsens when sitting for long periods of time.  Occasionally, pain in the back or buttock. CAUSES  Inflammation of the sciatic nerve is the cause of sciatica. The inflammation is due to something irritating the nerve. Common sources of irritation include:  Sitting for long periods of time.  Direct trauma to the nerve.  Arthritis of the spine.  Herniated or ruptured disk.  Slipping of the vertebrae (spondylolisthesis).  Pressure from soft tissues, such as muscles or ligament-like tissue (fascia). RISK INCREASES WITH:  Sports that place pressure or stress on the spine (football or weightlifting).  Poor strength and  flexibility.  Failure to warm up properly before activity.  Family history of low back pain or disk disorders.  Previous back injury or surgery.  Poor body mechanics, especially when lifting, or poor posture. PREVENTION   Warm up and stretch properly before activity.  Maintain physical fitness:  Strength, flexibility, and endurance.  Cardiovascular fitness.  Learn and use proper technique, especially with posture and lifting. When possible, have coach correct improper technique.  Avoid activities that place stress on the spine. PROGNOSIS If treated properly, then sciatica usually resolves within 6 weeks. However, occasionally surgery is necessary.  RELATED COMPLICATIONS   Permanent nerve damage, including pain, numbness, tingle, or weakness.  Chronic back pain.  Risks of surgery: infection, bleeding, nerve damage, or damage to surrounding tissues. TREATMENT Treatment initially involves resting from any activities that aggravate your symptoms. The use of ice and medication may help reduce pain and inflammation. The use of strengthening and stretching exercises may help reduce pain with activity. These exercises may be performed at home or with referral to a therapist. A therapist may recommend further treatments, such as transcutaneous electronic nerve stimulation (TENS) or ultrasound. Your caregiver may recommend corticosteroid injections to help reduce inflammation of the sciatic nerve. If symptoms persist despite non-surgical (conservative) treatment, then surgery may be recommended. MEDICATION  If pain medication is necessary, then nonsteroidal anti-inflammatory medications, such as aspirin and ibuprofen, or other minor pain relievers, such as acetaminophen, are often recommended.  Do not take pain medication for 7 days before surgery.  Prescription pain relievers may be given if deemed necessary by your   caregiver. Use only as directed and only as much as you  need.  Ointments applied to the skin may be helpful.  Corticosteroid injections may be given by your caregiver. These injections should be reserved for the most serious cases, because they may only be given a certain number of times. HEAT AND COLD  Cold treatment (icing) relieves pain and reduces inflammation. Cold treatment should be applied for 10 to 15 minutes every 2 to 3 hours for inflammation and pain and immediately after any activity that aggravates your symptoms. Use ice packs or massage the area with a piece of ice (ice massage).  Heat treatment may be used prior to performing the stretching and strengthening activities prescribed by your caregiver, physical therapist, or athletic trainer. Use a heat pack or soak the injury in warm water. SEEK MEDICAL CARE IF:  Treatment seems to offer no benefit, or the condition worsens.  Any medications produce adverse side effects. EXERCISES  RANGE OF MOTION (ROM) AND STRETCHING EXERCISES - Sciatica Most people with sciatic will find that their symptoms worsen with either excessive bending forward (flexion) or arching at the low back (extension). The exercises which will help resolve your symptoms will focus on the opposite motion. Your physician, physical therapist or athletic trainer will help you determine which exercises will be most helpful to resolve your low back pain. Do not complete any exercises without first consulting with your clinician. Discontinue any exercises which worsen your symptoms until you speak to your clinician. If you have pain, numbness or tingling which travels down into your buttocks, leg or foot, the goal of the therapy is for these symptoms to move closer to your back and eventually resolve. Occasionally, these leg symptoms will get better, but your low back pain may worsen; this is typically an indication of progress in your rehabilitation. Be certain to be very alert to any changes in your symptoms and the activities in  which you participated in the 24 hours prior to the change. Sharing this information with your clinician will allow him/her to most efficiently treat your condition. These exercises may help you when beginning to rehabilitate your injury. Your symptoms may resolve with or without further involvement from your physician, physical therapist or athletic trainer. While completing these exercises, remember:   Restoring tissue flexibility helps normal motion to return to the joints. This allows healthier, less painful movement and activity.  An effective stretch should be held for at least 30 seconds.  A stretch should never be painful. You should only feel a gentle lengthening or release in the stretched tissue. FLEXION RANGE OF MOTION AND STRETCHING EXERCISES: STRETCH - Flexion, Single Knee to Chest   Lie on a firm bed or floor with both legs extended in front of you.  Keeping one leg in contact with the floor, bring your opposite knee to your chest. Hold your leg in place by either grabbing behind your thigh or at your knee.  Pull until you feel a gentle stretch in your low back. Hold __________ seconds.  Slowly release your grasp and repeat the exercise with the opposite side. Repeat __________ times. Complete this exercise __________ times per day.  STRETCH - Flexion, Double Knee to Chest  Lie on a firm bed or floor with both legs extended in front of you.  Keeping one leg in contact with the floor, bring your opposite knee to your chest.  Tense your stomach muscles to support your back and then lift your other knee to   your chest. Hold your legs in place by either grabbing behind your thighs or at your knees.  Pull both knees toward your chest until you feel a gentle stretch in your low back. Hold __________ seconds.  Tense your stomach muscles and slowly return one leg at a time to the floor. Repeat __________ times. Complete this exercise __________ times per day.  STRETCH - Low Trunk  Rotation   Lie on a firm bed or floor. Keeping your legs in front of you, bend your knees so they are both pointed toward the ceiling and your feet are flat on the floor.  Extend your arms out to the side. This will stabilize your upper body by keeping your shoulders in contact with the floor.  Gently and slowly drop both knees together to one side until you feel a gentle stretch in your low back. Hold for __________ seconds.  Tense your stomach muscles to support your low back as you bring your knees back to the starting position. Repeat the exercise to the other side. Repeat __________ times. Complete this exercise __________ times per day  EXTENSION RANGE OF MOTION AND FLEXIBILITY EXERCISES: STRETCH - Extension, Prone on Elbows  Lie on your stomach on the floor, a bed will be too soft. Place your palms about shoulder width apart and at the height of your head.  Place your elbows under your shoulders. If this is too painful, stack pillows under your chest.  Allow your body to relax so that your hips drop lower and make contact more completely with the floor.  Hold this position for __________ seconds.  Slowly return to lying flat on the floor. Repeat __________ times. Complete this exercise __________ times per day.  RANGE OF MOTION - Extension, Prone Press Ups  Lie on your stomach on the floor, a bed will be too soft. Place your palms about shoulder width apart and at the height of your head.  Keeping your back as relaxed as possible, slowly straighten your elbows while keeping your hips on the floor. You may adjust the placement of your hands to maximize your comfort. As you gain motion, your hands will come more underneath your shoulders.  Hold this position __________ seconds.  Slowly return to lying flat on the floor. Repeat __________ times. Complete this exercise __________ times per day.  STRENGTHENING EXERCISES - Sciatica  These exercises may help you when beginning to  rehabilitate your injury. These exercises should be done near your "sweet spot." This is the neutral, low-back arch, somewhere between fully rounded and fully arched, that is your least painful position. When performed in this safe range of motion, these exercises can be used for people who have either a flexion or extension based injury. These exercises may resolve your symptoms with or without further involvement from your physician, physical therapist or athletic trainer. While completing these exercises, remember:   Muscles can gain both the endurance and the strength needed for everyday activities through controlled exercises.  Complete these exercises as instructed by your physician, physical therapist or athletic trainer. Progress with the resistance and repetition exercises only as your caregiver advises.  You may experience muscle soreness or fatigue, but the pain or discomfort you are trying to eliminate should never worsen during these exercises. If this pain does worsen, stop and make certain you are following the directions exactly. If the pain is still present after adjustments, discontinue the exercise until you can discuss the trouble with your clinician. STRENGTHENING - Deep   Abdominals, Pelvic Tilt   Lie on a firm bed or floor. Keeping your legs in front of you, bend your knees so they are both pointed toward the ceiling and your feet are flat on the floor.  Tense your lower abdominal muscles to press your low back into the floor. This motion will rotate your pelvis so that your tail bone is scooping upwards rather than pointing at your feet or into the floor.  With a gentle tension and even breathing, hold this position for __________ seconds. Repeat __________ times. Complete this exercise __________ times per day.  STRENGTHENING - Abdominals, Crunches   Lie on a firm bed or floor. Keeping your legs in front of you, bend your knees so they are both pointed toward the ceiling and  your feet are flat on the floor. Cross your arms over your chest.  Slightly tip your chin down without bending your neck.  Tense your abdominals and slowly lift your trunk high enough to just clear your shoulder blades. Lifting higher can put excessive stress on the low back and does not further strengthen your abdominal muscles.  Control your return to the starting position. Repeat __________ times. Complete this exercise __________ times per day.  STRENGTHENING - Quadruped, Opposite UE/LE Lift  Assume a hands and knees position on a firm surface. Keep your hands under your shoulders and your knees under your hips. You may place padding under your knees for comfort.  Find your neutral spine and gently tense your abdominal muscles so that you can maintain this position. Your shoulders and hips should form a rectangle that is parallel with the floor and is not twisted.  Keeping your trunk steady, lift your right hand no higher than your shoulder and then your left leg no higher than your hip. Make sure you are not holding your breath. Hold this position __________ seconds.  Continuing to keep your abdominal muscles tense and your back steady, slowly return to your starting position. Repeat with the opposite arm and leg. Repeat __________ times. Complete this exercise __________ times per day.  STRENGTHENING - Abdominals and Quadriceps, Straight Leg Raise   Lie on a firm bed or floor with both legs extended in front of you.  Keeping one leg in contact with the floor, bend the other knee so that your foot can rest flat on the floor.  Find your neutral spine, and tense your abdominal muscles to maintain your spinal position throughout the exercise.  Slowly lift your straight leg off the floor about 6 inches for a count of 15, making sure to not hold your breath.  Still keeping your neutral spine, slowly lower your leg all the way to the floor. Repeat this exercise with each leg __________  times. Complete this exercise __________ times per day. POSTURE AND BODY MECHANICS CONSIDERATIONS - Sciatica Keeping correct posture when sitting, standing or completing your activities will reduce the stress put on different body tissues, allowing injured tissues a chance to heal and limiting painful experiences. The following are general guidelines for improved posture. Your physician or physical therapist will provide you with any instructions specific to your needs. While reading these guidelines, remember:  The exercises prescribed by your provider will help you have the flexibility and strength to maintain correct postures.  The correct posture provides the optimal environment for your joints to work. All of your joints have less wear and tear when properly supported by a spine with good posture. This means you will experience   a healthier, less painful body.  Correct posture must be practiced with all of your activities, especially prolonged sitting and standing. Correct posture is as important when doing repetitive low-stress activities (typing) as it is when doing a single heavy-load activity (lifting). RESTING POSITIONS Consider which positions are most painful for you when choosing a resting position. If you have pain with flexion-based activities (sitting, bending, stooping, squatting), choose a position that allows you to rest in a less flexed posture. You would want to avoid curling into a fetal position on your side. If your pain worsens with extension-based activities (prolonged standing, working overhead), avoid resting in an extended position such as sleeping on your stomach. Most people will find more comfort when they rest with their spine in a more neutral position, neither too rounded nor too arched. Lying on a non-sagging bed on your side with a pillow between your knees, or on your back with a pillow under your knees will often provide some relief. Keep in mind, being in any one  position for a prolonged period of time, no matter how correct your posture, can still lead to stiffness. PROPER SITTING POSTURE In order to minimize stress and discomfort on your spine, you must sit with correct posture Sitting with good posture should be effortless for a healthy body. Returning to good posture is a gradual process. Many people can work toward this most comfortably by using various supports until they have the flexibility and strength to maintain this posture on their own. When sitting with proper posture, your ears will fall over your shoulders and your shoulders will fall over your hips. You should use the back of the chair to support your upper back. Your low back will be in a neutral position, just slightly arched. You may place a small pillow or folded towel at the base of your low back for support.  When working at a desk, create an environment that supports good, upright posture. Without extra support, muscles fatigue and lead to excessive strain on joints and other tissues. Keep these recommendations in mind: CHAIR:   A chair should be able to slide under your desk when your back makes contact with the back of the chair. This allows you to work closely.  The chair's height should allow your eyes to be level with the upper part of your monitor and your hands to be slightly lower than your elbows. BODY POSITION  Your feet should make contact with the floor. If this is not possible, use a foot rest.  Keep your ears over your shoulders. This will reduce stress on your neck and low back. INCORRECT SITTING POSTURES   If you are feeling tired and unable to assume a healthy sitting posture, do not slouch or slump. This puts excessive strain on your back tissues, causing more damage and pain. Healthier options include:  Using more support, like a lumbar pillow.  Switching tasks to something that requires you to be upright or walking.  Talking a brief walk.  Lying down to  rest in a neutral-spine position. PROLONGED STANDING WHILE SLIGHTLY LEANING FORWARD  When completing a task that requires you to lean forward while standing in one place for a long time, place either foot up on a stationary 2-4 inch high object to help maintain the best posture. When both feet are on the ground, the low back tends to lose its slight inward curve. If this curve flattens (or becomes too large), then the back and your other   joints will experience too much stress, fatigue more quickly and can cause pain.  CORRECT STANDING POSTURES Proper standing posture should be assumed with all daily activities, even if they only take a few moments, like when brushing your teeth. As in sitting, your ears should fall over your shoulders and your shoulders should fall over your hips. You should keep a slight tension in your abdominal muscles to brace your spine. Your tailbone should point down to the ground, not behind your body, resulting in an over-extended swayback posture.  INCORRECT STANDING POSTURES  Common incorrect standing postures include a forward head, locked knees and/or an excessive swayback. WALKING Walk with an upright posture. Your ears, shoulders and hips should all line-up. PROLONGED ACTIVITY IN A FLEXED POSITION When completing a task that requires you to bend forward at your waist or lean over a low surface, try to find a way to stabilize 3 of 4 of your limbs. You can place a hand or elbow on your thigh or rest a knee on the surface you are reaching across. This will provide you more stability so that your muscles do not fatigue as quickly. By keeping your knees relaxed, or slightly bent, you will also reduce stress across your low back. CORRECT LIFTING TECHNIQUES DO :   Assume a wide stance. This will provide you more stability and the opportunity to get as close as possible to the object which you are lifting.  Tense your abdominals to brace your spine; then bend at the knees and  hips. Keeping your back locked in a neutral-spine position, lift using your leg muscles. Lift with your legs, keeping your back straight.  Test the weight of unknown objects before attempting to lift them.  Try to keep your elbows locked down at your sides in order get the best strength from your shoulders when carrying an object.  Always ask for help when lifting heavy or awkward objects. INCORRECT LIFTING TECHNIQUES DO NOT:   Lock your knees when lifting, even if it is a small object.  Bend and twist. Pivot at your feet or move your feet when needing to change directions.  Assume that you cannot safely pick up a paperclip without proper posture.   This information is not intended to replace advice given to you by your health care provider. Make sure you discuss any questions you have with your health care provider.   Document Released: 02/23/2005 Document Revised: 07/10/2014 Document Reviewed: 06/07/2008 Elsevier Interactive Patient Education 2016 [employer]  

## 2015-10-21 NOTE — Progress Notes (Signed)
   Jocelyn Little  MRN: [id number] DOB: 03-30-1950  Subjective:  Pt presents to clinic  Review of Systems  Patient Active Problem List   Diagnosis Date Noted  . Asthma, mild intermittent, well-controlled 01/23/2015  . Acute confusional state 12/07/2014  . Meralgia paresthetica of right side 01/29/2014  . HYPERTENSION 05/16/2009  . RHINOSINUSITIS, ACUTE 05/16/2009  . G E R D 05/16/2009  . BRONCHITIS, ACUTE 03/15/2009  . Obstructive sleep apnea 03/12/2009  . Seasonal and perennial allergic rhinitis 03/12/2009    Current Outpatient Prescriptions on File Prior to Visit  Medication Sig Dispense Refill  . albuterol (PROVENTIL HFA;VENTOLIN HFA) 108 (90 BASE) MCG/ACT inhaler Inhale 2 puffs into the lungs every 6 (six) hours as needed for wheezing. 1 Inhaler 11  . amLODipine (NORVASC) 5 MG tablet Take 5 mg by mouth daily.    Marland Kitchen. aspirin 81 MG tablet Take 81 mg by mouth daily.    Marland Kitchen. BRINTELLIX 10 MG TABS Take 1 tablet by mouth daily.  0  . Cyanocobalamin (B12 LIQUID HEALTH BOOSTER PO) Take 1 Bottle by mouth every other day.     . esomeprazole (NEXIUM) 40 MG capsule Take 40 mg by mouth as needed (for indigestion).     . fluticasone (FLONASE) 50 MCG/ACT nasal spray 2 puffs each nostril once or twic daily when needed (Patient taking differently: Place 2 sprays into both nostrils at bedtime as needed for rhinitis. ) 16 g 11  . lovastatin (MEVACOR) 20 MG tablet Take 20 mg by mouth at bedtime.    . metFORMIN (GLUCOPHAGE) 500 MG tablet Take 1 tablet by mouth daily.  0  . metoprolol (LOPRESSOR) 50 MG tablet Take 50 mg by mouth daily.    . Multiple Vitamin (MULTIVITAMIN) tablet Take 1 tablet by mouth every other day.     . RESTASIS 0.05 % ophthalmic emulsion Place 2 drops into both eyes 2 (two) times daily.  0  . HYDROcodone-acetaminophen (NORCO/VICODIN) 5-325 MG tablet Take 1 tablet by mouth every 6 (six) hours as needed. (Patient not taking: Reported on 10/21/2015) 10 tablet 0   No  current facility-administered medications on file prior to visit.     Allergies  Allergen Reactions  . Citrus Itching  . Dust Mite Extract Other (See Comments)    Sneezing, Runny nose   . Naproxen     Itching after taking last night   . Other Itching    Acidic foods    Pt patients past, family and social history were reviewed and updated.  Objective:  BP 118/82 (BP Location: Left Arm, Patient Position: Sitting, Cuff Size: Large)   Pulse 70   Temp 98.4 F (36.9 C) (Oral)   Resp 17   Ht 5\' 5"  (1.651 m)   Wt 259 lb (117.5 kg)   SpO2 95%   BMI 43.10 kg/m   Physical Exam  Assessment and Plan :  No diagnosis found.  Benny LennertSarah Floye Fesler PA-C  [facility] 10/21/2015 2:24 PM
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
## 2015-10-31 ENCOUNTER — Encounter: Payer: Self-pay | Admitting: Family Medicine

## 2015-11-12 ENCOUNTER — Ambulatory Visit
Admission: RE | Admit: 2015-11-12 | Discharge: 2015-11-12 | Disposition: A | Discharge status: Home / Self Care | Payer: Medicare Other | Source: Ambulatory Visit | Attending: Specialist | Admitting: Specialist

## 2015-11-12 ENCOUNTER — Ambulatory Visit
Admission: RE | Admit: 2015-11-12 | Discharge: 2015-11-12 | Disposition: A | Discharge status: Home / Self Care | Payer: Medicare Other | Source: Ambulatory Visit

## 2015-11-12 ENCOUNTER — Other Ambulatory Visit: Payer: Self-pay | Admitting: Specialist

## 2015-11-12 DIAGNOSIS — Z1231 Encounter for screening mammogram for malignant neoplasm of breast: Secondary | ICD-10-CM

## 2015-12-05 ENCOUNTER — Encounter: Payer: Self-pay | Admitting: Family Medicine

## 2015-12-05 ENCOUNTER — Ambulatory Visit (INDEPENDENT_AMBULATORY_CARE_PROVIDER_SITE_OTHER): Payer: Medicare Other | Admitting: Family Medicine

## 2015-12-05 VITALS — BP 124/80 | HR 78 | Temp 98.5°F | Resp 16 | Ht 65.0 in | Wt 253.0 lb

## 2015-12-05 DIAGNOSIS — M5137 Other intervertebral disc degeneration, lumbosacral region: Secondary | ICD-10-CM | POA: Diagnosis not present

## 2015-12-05 DIAGNOSIS — M1288 Other specific arthropathies, not elsewhere classified, other specified site: Secondary | ICD-10-CM

## 2015-12-05 DIAGNOSIS — G6289 Other specified polyneuropathies: Secondary | ICD-10-CM | POA: Diagnosis not present

## 2015-12-05 DIAGNOSIS — M47816 Spondylosis without myelopathy or radiculopathy, lumbar region: Secondary | ICD-10-CM

## 2015-12-05 DIAGNOSIS — M51379 Other intervertebral disc degeneration, lumbosacral region without mention of lumbar back pain or lower extremity pain: Secondary | ICD-10-CM

## 2015-12-05 DIAGNOSIS — G8929 Other chronic pain: Secondary | ICD-10-CM | POA: Diagnosis not present

## 2015-12-05 DIAGNOSIS — M5442 Lumbago with sciatica, left side: Secondary | ICD-10-CM

## 2015-12-05 DIAGNOSIS — M1990 Unspecified osteoarthritis, unspecified site: Secondary | ICD-10-CM | POA: Diagnosis not present

## 2015-12-05 DIAGNOSIS — Z5181 Encounter for therapeutic drug level monitoring: Secondary | ICD-10-CM | POA: Diagnosis not present

## 2015-12-05 DIAGNOSIS — M1610 Unilateral primary osteoarthritis, unspecified hip: Secondary | ICD-10-CM

## 2015-12-05 DIAGNOSIS — M25552 Pain in left hip: Secondary | ICD-10-CM | POA: Diagnosis not present

## 2015-12-05 DIAGNOSIS — Z23 Encounter for immunization: Secondary | ICD-10-CM

## 2015-12-05 DIAGNOSIS — E1140 Type 2 diabetes mellitus with diabetic neuropathy, unspecified: Secondary | ICD-10-CM

## 2015-12-05 MED ORDER — DICLOFENAC SODIUM 75 MG PO TBEC: 75.0000 mg | DELAYED_RELEASE_TABLET | Freq: Two times a day (BID) | ORAL | 1 refills | Status: DC

## 2015-12-05 NOTE — Progress Notes (Signed)
Subjective:    Patient ID: Jocelyn Little, female    DOB: [birth year], 65 y.o.   MRN: [id number] Chief Complaint  Patient presents with  . Follow-up    6 week follow for left hip, pain is better with medication, but would like to know if there is something else other than medication that will help   . imaging    patient like to know if there is an imaging that could be done to see what is causing the pain     HPI  She feels like the medicaiton diclofenac is helping but if it wears off she is in just as much pain in her left pain.  Left hip pain radiates into groin very rarely. Right hip just a little but not as severe as the severityu of the left. When severe, pain will radiate all the way down her leg to her left toes.  Has had occ episodes of weakness.  I referred her to PT and they called her but she never ended up   Has bilateral tinging/numbness in both feet that is intermittent, not daily and happens more when she is upright/standing.  Sometimes if she is able to put her feet up it will be relieved.  It will occur suddenly while she is active.  No changes in bowels/bladder.  Not checking cbgs.  Past Medical History:  Diagnosis Date  . Diabetes mellitus without complication (HCC)   . GERD (gastroesophageal reflux disease)   . High cholesterol   . HTN (hypertension)   . OSA (obstructive sleep apnea)   . Rhinitis    Past Surgical History:  Procedure Laterality Date  . CHOLECYSTECTOMY    . LIPOMA EXCISION    . TOTAL ABDOMINAL HYSTERECTOMY    . UVULOPALATOPHARYNGOPLASTY     Current Outpatient Prescriptions on File Prior to Visit  Medication Sig Dispense Refill  . albuterol (PROVENTIL HFA;VENTOLIN HFA) 108 (90 BASE) MCG/ACT inhaler Inhale 2 puffs into the lungs every 6 (six) hours as needed for wheezing. 1 Inhaler 11  . amLODipine (NORVASC) 5 MG tablet Take 5 mg by mouth daily.    Marland Kitchen aspirin 81 MG tablet Take 81 mg by mouth daily.    Marland Kitchen BRINTELLIX 10 MG TABS Take  1 tablet by mouth daily.  0  . Cyanocobalamin (B12 LIQUID HEALTH BOOSTER PO) Take 1 Bottle by mouth every other day.     . esomeprazole (NEXIUM) 40 MG capsule Take 40 mg by mouth as needed (for indigestion).     Marland Kitchen lovastatin (MEVACOR) 20 MG tablet Take 20 mg by mouth at bedtime.    . metFORMIN (GLUCOPHAGE) 500 MG tablet Take 1 tablet by mouth daily.  0  . metoprolol (LOPRESSOR) 50 MG tablet Take 50 mg by mouth daily.    . Multiple Vitamin (MULTIVITAMIN) tablet Take 1 tablet by mouth every other day.     . RESTASIS 0.05 % ophthalmic emulsion Place 2 drops into both eyes 2 (two) times daily.  0   No current facility-administered medications on file prior to visit.    Allergies  Allergen Reactions  . Citrus Itching  . Dust Mite Extract Other (See Comments)    Sneezing, Runny nose   . Naproxen     Itching after taking last night   . Other Itching    Acidic foods   Family History  Problem Relation Age of Onset  . Emphysema Father   . Rheum arthritis Father   . Lung cancer Father   .  Heart disease Mother   . Asthma Brother   . Heart disease Brother   . Rheum arthritis Brother   . Diabetes Brother    Social History   Social History  . Marital status: Single    Spouse name: N/A  . Number of children: 4  . Years of education: GED   Occupational History  . retired Psychiatric nursefactory worker    Social History Main Topics  . Smoking status: Former Smoker    Packs/day: 0.80    Years: 20.00    Types: Cigarettes    Quit date: 03/09/1998  . Smokeless tobacco: Never Used  . Alcohol use No     Comment: rarely  . Drug use: No  . Sexual activity: Yes    Birth control/ protection: Surgical   Other Topics Concern  . None   Social History Narrative   Has 4 children and 3 of them live with her in a one story home.  Retired from being a Engineer, materialssecurity officer and a Geologist, engineeringchauffer.   Education:  High school and some college.       Patient very rarely drinks caffeine.   Patient is right handed.      Review of Systems  Constitutional: Negative for chills and fever.  Gastrointestinal: Negative for abdominal pain, constipation, diarrhea, nausea and vomiting.  Genitourinary: Negative for decreased urine volume, difficulty urinating, frequency and urgency.  Musculoskeletal: Positive for arthralgias, gait problem and myalgias. Negative for back pain and joint swelling.  Neurological: Positive for numbness. Negative for weakness.  Hematological: Negative for adenopathy. Does not bruise/bleed easily.  Psychiatric/Behavioral: Positive for sleep disturbance.       Objective:   Physical Exam  Constitutional: She is oriented to person, place, and time. She appears well-developed and well-nourished. No distress.  HENT:  Head: Normocephalic and atraumatic.  Right Ear: External ear normal.  Left Ear: External ear normal.  Eyes: Conjunctivae are normal. No scleral icterus.  Neck: Normal range of motion. Neck supple. No thyromegaly present.  Cardiovascular: Normal rate, regular rhythm, normal heart sounds and intact distal pulses.   Pulmonary/Chest: Effort normal and breath sounds normal. No respiratory distress.  Musculoskeletal: She exhibits no edema.       Right hip: She exhibits normal range of motion, normal strength and no tenderness.       Left hip: She exhibits normal range of motion, normal strength and no tenderness.       Lumbar back: She exhibits decreased range of motion and spasm. She exhibits no tenderness, no bony tenderness and no pain.  Lymphadenopathy:    She has no cervical adenopathy.  Neurological: She is alert and oriented to person, place, and time. She has normal strength. She displays no atrophy and no tremor. A sensory deficit is present. She exhibits normal muscle tone. Gait normal.  Reflex Scores:      Patellar reflexes are 2+ on the right side and 2+ on the left side.      Achilles reflexes are 2+ on the right side and 2+ on the left side. Skin: Skin is warm  and dry. She is not diaphoretic. No erythema.  Psychiatric: She has a normal mood and affect. Her behavior is normal.       BP 124/80 (BP Location: Left Arm, Patient Position: Sitting, Cuff Size: Large)   Pulse 78   Temp 98.5 F (36.9 C) (Oral)   Resp 16   Ht 5\' 5"  (1.651 m)   Wt 253 lb (114.8 kg)  SpO2 95%   BMI 42.10 kg/m      Assessment & Plan:   1. Need for prophylactic vaccination and inoculation against influenza   2. Chronic bilateral low back pain with left-sided sciatica   3. Left hip pain   4. Lumbar facet arthropathy   5. Degeneration of lumbar or lumbosacral intervertebral disc   6. Other polyneuropathy (HCC)   7. Type 2 diabetes mellitus with diabetic neuropathy, without long-term current use of insulin (HCC)   8. Hip arthritis   9. Medication monitoring encounter    Pt has failed to improve with 6 weeks of nsaids and conservative home treatment with ice and home exercises.  She never heard about PT referral. I suspect that she is having sciatica from her lumbar spine but can't completely exclude hip etiology so will refer to for lumbar MRI and to Dr. Yevette Edwards for further eval  Orders Placed This Encounter  Procedures  . MR Lumbar Spine Wo Contrast    Standing Status:   Future    Standing Expiration Date:   02/03/2017    Order Specific Question:   Reason for Exam (SYMPTOM  OR DIAGNOSIS REQUIRED)    Answer:   worsening lumbar pain wiht left sciatica, failed 6 wks of nsaids    Order Specific Question:   Preferred imaging location?    Answer:   [facility] (table limit-550lbs)    Order Specific Question:   What is the patient's sedation requirement?    Answer:   No Sedation    Order Specific Question:   Does the patient have a pacemaker or implanted devices?    Answer:   No  . Flu Vaccine QUAD 36+ mos IM  . CBC  . Vitamin B12  . BASIC METABOLIC PANEL WITH GFR  . Ambulatory referral to Orthopedic Surgery    Referral Priority:   Routine    Referral  Type:   Surgical    Referral Reason:   Specialty Services Required    Requested Specialty:   Orthopedic Surgery    Number of Visits Requested:   1    Meds ordered this encounter  Medications  . diclofenac (VOLTAREN) 75 MG EC tablet    Sig: Take 1 tablet (75 mg total) by mouth 2 (two) times daily.    Dispense:  60 tablet    Refill:  1    Norberto Sorenson, M.D.  [facility] [address] [place], [place] [address] [phone] phone [phone] fax  12/09/15 11:16 AM

## 2015-12-05 NOTE — Patient Instructions (Addendum)
   IF you received an x-ray today, you will receive an invoice from [facility]. Please contact [facility] at [phone] with questions or concerns regarding your invoice.   IF you received labwork today, you will receive an invoice from [facility]. Please contact [facility] at [phone] with questions or concerns regarding your invoice.   Our billing staff will not be able to assist you with questions regarding bills from these companies.  You will be contacted with the lab results as soon as they are available. The fastest way to get your results is to activate your My Chart account. Instructions are located on the last page of this paperwork. If you have not heard from us regarding the results in 2 weeks, please contact this office.      Sciatica With Rehab The sciatic nerve runs from the back down the leg and is responsible for sensation and control of the muscles in the back (posterior) side of the thigh, lower leg, and foot. Sciatica is a condition that is characterized by inflammation of this nerve.  SYMPTOMS   Signs of nerve damage, including numbness and/or weakness along the posterior side of the lower extremity.  Pain in the back of the thigh that may also travel down the leg.  Pain that worsens when sitting for long periods of time.  Occasionally, pain in the back or buttock. CAUSES  Inflammation of the sciatic nerve is the cause of sciatica. The inflammation is due to something irritating the nerve. Common sources of irritation include:  Sitting for long periods of time.  Direct trauma to the nerve.  Arthritis of the spine.  Herniated or ruptured disk.  Slipping of the vertebrae (spondylolisthesis).  Pressure from soft tissues, such as muscles or ligament-like tissue (fascia). RISK INCREASES WITH:  Sports that place pressure or stress on the spine (football or weightlifting).  Poor strength and  flexibility.  Failure to warm up properly before activity.  Family history of low back pain or disk disorders.  Previous back injury or surgery.  Poor body mechanics, especially when lifting, or poor posture. PREVENTION   Warm up and stretch properly before activity.  Maintain physical fitness:  Strength, flexibility, and endurance.  Cardiovascular fitness.  Learn and use proper technique, especially with posture and lifting. When possible, have coach correct improper technique.  Avoid activities that place stress on the spine. PROGNOSIS If treated properly, then sciatica usually resolves within 6 weeks. However, occasionally surgery is necessary.  RELATED COMPLICATIONS   Permanent nerve damage, including pain, numbness, tingle, or weakness.  Chronic back pain.  Risks of surgery: infection, bleeding, nerve damage, or damage to surrounding tissues. TREATMENT Treatment initially involves resting from any activities that aggravate your symptoms. The use of ice and medication may help reduce pain and inflammation. The use of strengthening and stretching exercises may help reduce pain with activity. These exercises may be performed at home or with referral to a therapist. A therapist may recommend further treatments, such as transcutaneous electronic nerve stimulation (TENS) or ultrasound. Your caregiver may recommend corticosteroid injections to help reduce inflammation of the sciatic nerve. If symptoms persist despite non-surgical (conservative) treatment, then surgery may be recommended. MEDICATION  If pain medication is necessary, then nonsteroidal anti-inflammatory medications, such as aspirin and ibuprofen, or other minor pain relievers, such as acetaminophen, are often recommended.  Do not take pain medication for 7 days before surgery.  Prescription pain relievers may be given if deemed necessary by your   caregiver. Use only as directed and only as much as you  need.  Ointments applied to the skin may be helpful.  Corticosteroid injections may be given by your caregiver. These injections should be reserved for the most serious cases, because they may only be given a certain number of times. HEAT AND COLD  Cold treatment (icing) relieves pain and reduces inflammation. Cold treatment should be applied for 10 to 15 minutes every 2 to 3 hours for inflammation and pain and immediately after any activity that aggravates your symptoms. Use ice packs or massage the area with a piece of ice (ice massage).  Heat treatment may be used prior to performing the stretching and strengthening activities prescribed by your caregiver, physical therapist, or athletic trainer. Use a heat [place] or soak the injury in warm water. SEEK MEDICAL CARE IF:  Treatment seems to offer no benefit, or the condition worsens.  Any medications produce adverse side effects. EXERCISES  RANGE OF MOTION (ROM) AND STRETCHING EXERCISES - Sciatica Most people with sciatic will find that their symptoms worsen with either excessive bending forward (flexion) or arching at the low back (extension). The exercises which will help resolve your symptoms will focus on the opposite motion. Your physician, physical therapist or athletic trainer will help you determine which exercises will be most helpful to resolve your low back pain. Do not complete any exercises without first consulting with your clinician. Discontinue any exercises which worsen your symptoms until you speak to your clinician. If you have pain, numbness or tingling which travels down into your buttocks, leg or foot, the goal of the therapy is for these symptoms to move closer to your back and eventually resolve. Occasionally, these leg symptoms will get better, but your low back pain may worsen; this is typically an indication of progress in your rehabilitation. Be certain to be very alert to any changes in your symptoms and the activities in  which you participated in the 24 hours prior to the change. Sharing this information with your clinician will allow him/her to most efficiently treat your condition. These exercises may help you when beginning to rehabilitate your injury. Your symptoms may resolve with or without further involvement from your physician, physical therapist or athletic trainer. While completing these exercises, remember:   Restoring tissue flexibility helps normal motion to return to the joints. This allows healthier, less painful movement and activity.  An effective stretch should be held for at least 30 seconds.  A stretch should never be painful. You should only feel a gentle lengthening or release in the stretched tissue. FLEXION RANGE OF MOTION AND STRETCHING EXERCISES: STRETCH - Flexion, Single Knee to Chest   Lie on a firm bed or floor with both legs extended in front of you.  Keeping one leg in contact with the floor, bring your opposite knee to your chest. Hold your leg in place by either grabbing behind your thigh or at your knee.  Pull until you feel a gentle stretch in your low back. Hold __________ seconds.  Slowly release your grasp and repeat the exercise with the opposite side. Repeat __________ times. Complete this exercise __________ times per day.  STRETCH - Flexion, Double Knee to Chest  Lie on a firm bed or floor with both legs extended in front of you.  Keeping one leg in contact with the floor, bring your opposite knee to your chest.  Tense your stomach muscles to support your back and then lift your other knee to   your chest. Hold your legs in place by either grabbing behind your thighs or at your knees.  Pull both knees toward your chest until you feel a gentle stretch in your low back. Hold __________ seconds.  Tense your stomach muscles and slowly return one leg at a time to the floor. Repeat __________ times. Complete this exercise __________ times per day.  STRETCH - Low Trunk  Rotation   Lie on a firm bed or floor. Keeping your legs in front of you, bend your knees so they are both pointed toward the ceiling and your feet are flat on the floor.  Extend your arms out to the side. This will stabilize your upper body by keeping your shoulders in contact with the floor.  Gently and slowly drop both knees together to one side until you feel a gentle stretch in your low back. Hold for __________ seconds.  Tense your stomach muscles to support your low back as you bring your knees back to the starting position. Repeat the exercise to the other side. Repeat __________ times. Complete this exercise __________ times per day  EXTENSION RANGE OF MOTION AND FLEXIBILITY EXERCISES: STRETCH - Extension, Prone on Elbows  Lie on your stomach on the floor, a bed will be too soft. Place your palms about shoulder width apart and at the height of your head.  Place your elbows under your shoulders. If this is too painful, stack pillows under your chest.  Allow your body to relax so that your hips drop lower and make contact more completely with the floor.  Hold this position for __________ seconds.  Slowly return to lying flat on the floor. Repeat __________ times. Complete this exercise __________ times per day.  RANGE OF MOTION - Extension, Prone Press Ups  Lie on your stomach on the floor, a bed will be too soft. Place your palms about shoulder width apart and at the height of your head.  Keeping your back as relaxed as possible, slowly straighten your elbows while keeping your hips on the floor. You may adjust the placement of your hands to maximize your comfort. As you gain motion, your hands will come more underneath your shoulders.  Hold this position __________ seconds.  Slowly return to lying flat on the floor. Repeat __________ times. Complete this exercise __________ times per day.  STRENGTHENING EXERCISES - Sciatica  These exercises may help you when beginning to  rehabilitate your injury. These exercises should be done near your "sweet spot." This is the neutral, low-back arch, somewhere between fully rounded and fully arched, that is your least painful position. When performed in this safe range of motion, these exercises can be used for people who have either a flexion or extension based injury. These exercises may resolve your symptoms with or without further involvement from your physician, physical therapist or athletic trainer. While completing these exercises, remember:   Muscles can gain both the endurance and the strength needed for everyday activities through controlled exercises.  Complete these exercises as instructed by your physician, physical therapist or athletic trainer. Progress with the resistance and repetition exercises only as your caregiver advises.  You may experience muscle soreness or fatigue, but the pain or discomfort you are trying to eliminate should never worsen during these exercises. If this pain does worsen, stop and make certain you are following the directions exactly. If the pain is still present after adjustments, discontinue the exercise until you can discuss the trouble with your clinician. STRENGTHENING - Deep   Abdominals, Pelvic Tilt   Lie on a firm bed or floor. Keeping your legs in front of you, bend your knees so they are both pointed toward the ceiling and your feet are flat on the floor.  Tense your lower abdominal muscles to press your low back into the floor. This motion will rotate your pelvis so that your tail bone is scooping upwards rather than pointing at your feet or into the floor.  With a gentle tension and even breathing, hold this position for __________ seconds. Repeat __________ times. Complete this exercise __________ times per day.  STRENGTHENING - Abdominals, Crunches   Lie on a firm bed or floor. Keeping your legs in front of you, bend your knees so they are both pointed toward the ceiling and  your feet are flat on the floor. Cross your arms over your chest.  Slightly tip your chin down without bending your neck.  Tense your abdominals and slowly lift your trunk high enough to just clear your shoulder blades. Lifting higher can put excessive stress on the low back and does not further strengthen your abdominal muscles.  Control your return to the starting position. Repeat __________ times. Complete this exercise __________ times per day.  STRENGTHENING - Quadruped, Opposite UE/LE Lift  Assume a hands and knees position on a firm surface. Keep your hands under your shoulders and your knees under your hips. You may place padding under your knees for comfort.  Find your neutral spine and gently tense your abdominal muscles so that you can maintain this position. Your shoulders and hips should form a rectangle that is parallel with the floor and is not twisted.  Keeping your trunk steady, lift your right hand no higher than your shoulder and then your left leg no higher than your hip. Make sure you are not holding your breath. Hold this position __________ seconds.  Continuing to keep your abdominal muscles tense and your back steady, slowly return to your starting position. Repeat with the opposite arm and leg. Repeat __________ times. Complete this exercise __________ times per day.  STRENGTHENING - Abdominals and Quadriceps, Straight Leg Raise   Lie on a firm bed or floor with both legs extended in front of you.  Keeping one leg in contact with the floor, bend the other knee so that your foot can rest flat on the floor.  Find your neutral spine, and tense your abdominal muscles to maintain your spinal position throughout the exercise.  Slowly lift your straight leg off the floor about 6 inches for a count of 15, making sure to not hold your breath.  Still keeping your neutral spine, slowly lower your leg all the way to the floor. Repeat this exercise with each leg __________  times. Complete this exercise __________ times per day. POSTURE AND BODY MECHANICS CONSIDERATIONS - Sciatica Keeping correct posture when sitting, standing or completing your activities will reduce the stress put on different body tissues, allowing injured tissues a chance to heal and limiting painful experiences. The following are general guidelines for improved posture. Your physician or physical therapist will provide you with any instructions specific to your needs. While reading these guidelines, remember:  The exercises prescribed by your provider will help you have the flexibility and strength to maintain correct postures.  The correct posture provides the optimal environment for your joints to work. All of your joints have less wear and tear when properly supported by a spine with good posture. This means you will experience   a healthier, less painful body.  Correct posture must be practiced with all of your activities, especially prolonged sitting and standing. Correct posture is as important when doing repetitive low-stress activities (typing) as it is when doing a single heavy-load activity (lifting). RESTING POSITIONS Consider which positions are most painful for you when choosing a resting position. If you have pain with flexion-based activities (sitting, bending, stooping, squatting), choose a position that allows you to rest in a less flexed posture. You would want to avoid curling into a fetal position on your side. If your pain worsens with extension-based activities (prolonged standing, working overhead), avoid resting in an extended position such as sleeping on your stomach. Most people will find more comfort when they rest with their spine in a more neutral position, neither too rounded nor too arched. Lying on a non-sagging bed on your side with a pillow between your knees, or on your back with a pillow under your knees will often provide some relief. Keep in mind, being in any one  position for a prolonged period of time, no matter how correct your posture, can still lead to stiffness. PROPER SITTING POSTURE In order to minimize stress and discomfort on your spine, you must sit with correct posture Sitting with good posture should be effortless for a healthy body. Returning to good posture is a gradual process. Many people can work toward this most comfortably by using various supports until they have the flexibility and strength to maintain this posture on their own. When sitting with proper posture, your ears will fall over your shoulders and your shoulders will fall over your hips. You should use the back of the chair to support your upper back. Your low back will be in a neutral position, just slightly arched. You may place a small pillow or folded towel at the base of your low back for support.  When working at a desk, create an environment that supports good, upright posture. Without extra support, muscles fatigue and lead to excessive strain on joints and other tissues. Keep these recommendations in mind: CHAIR:   A chair should be able to slide under your desk when your back makes contact with the back of the chair. This allows you to work closely.  The chair's height should allow your eyes to be level with the upper part of your monitor and your hands to be slightly lower than your elbows. BODY POSITION  Your feet should make contact with the floor. If this is not possible, use a foot rest.  Keep your ears over your shoulders. This will reduce stress on your neck and low back. INCORRECT SITTING POSTURES   If you are feeling tired and unable to assume a healthy sitting posture, do not slouch or slump. This puts excessive strain on your back tissues, causing more damage and pain. Healthier options include:  Using more support, like a lumbar pillow.  Switching tasks to something that requires you to be upright or walking.  Talking a brief walk.  Lying down to  rest in a neutral-spine position. PROLONGED STANDING WHILE SLIGHTLY LEANING FORWARD  When completing a task that requires you to lean forward while standing in one place for a long time, place either foot up on a stationary 2-4 inch high object to help maintain the best posture. When both feet are on the ground, the low back tends to lose its slight inward curve. If this curve flattens (or becomes too large), then the back and your other   joints will experience too much stress, fatigue more quickly and can cause pain.  CORRECT STANDING POSTURES Proper standing posture should be assumed with all daily activities, even if they only take a few moments, like when brushing your teeth. As in sitting, your ears should fall over your shoulders and your shoulders should fall over your hips. You should keep a slight tension in your abdominal muscles to brace your spine. Your tailbone should point down to the ground, not behind your body, resulting in an over-extended swayback posture.  INCORRECT STANDING POSTURES  Common incorrect standing postures include a forward head, locked knees and/or an excessive swayback. WALKING Walk with an upright posture. Your ears, shoulders and hips should all line-up. PROLONGED ACTIVITY IN A FLEXED POSITION When completing a task that requires you to bend forward at your waist or lean over a low surface, try to find a way to stabilize 3 of 4 of your limbs. You can place a hand or elbow on your thigh or rest a knee on the surface you are reaching across. This will provide you more stability so that your muscles do not fatigue as quickly. By keeping your knees relaxed, or slightly bent, you will also reduce stress across your low back. CORRECT LIFTING TECHNIQUES DO :   Assume a wide stance. This will provide you more stability and the opportunity to get as close as possible to the object which you are lifting.  Tense your abdominals to brace your spine; then bend at the knees and  hips. Keeping your back locked in a neutral-spine position, lift using your leg muscles. Lift with your legs, keeping your back straight.  Test the weight of unknown objects before attempting to lift them.  Try to keep your elbows locked down at your sides in order get the best strength from your shoulders when carrying an object.  Always ask for help when lifting heavy or awkward objects. INCORRECT LIFTING TECHNIQUES DO NOT:   Lock your knees when lifting, even if it is a small object.  Bend and twist. Pivot at your feet or move your feet when needing to change directions.  Assume that you cannot safely pick up a paperclip without proper posture.   This information is not intended to replace advice given to you by your health care provider. Make sure you discuss any questions you have with your health care provider.   Document Released: 02/23/2005 Document Revised: 07/10/2014 Document Reviewed: 06/07/2008 Elsevier Interactive Patient Education 2016 [employer]  

## 2015-12-06 LAB — VITAMIN B12: Vitamin B-12: 1728 pg/mL — ABNORMAL HIGH (ref 200–1100)

## 2015-12-06 LAB — BASIC METABOLIC PANEL WITH GFR
BUN: 16 mg/dL (ref 7–25)
CALCIUM: 8.7 mg/dL (ref 8.6–10.4)
CO2: 30 mmol/L (ref 20–31)
Chloride: 103 mmol/L (ref 98–110)
Creat: 0.95 mg/dL (ref 0.50–0.99)
GFR, EST AFRICAN AMERICAN: 73 mL/min (ref >=60)
GFR, EST NON AFRICAN AMERICAN: 63 mL/min (ref >=60)
Glucose, Bld: 85 mg/dL (ref 65–99)
Potassium: 4.2 mmol/L (ref 3.5–5.3)
SODIUM: 142 mmol/L (ref 135–146)

## 2015-12-06 LAB — CBC
HEMATOCRIT: 38.2 % (ref 35.0–45.0)
Hemoglobin: 12.7 g/dL (ref 11.7–15.5)
MCH: 29.7 pg (ref 27.0–33.0)
MCHC: 33.2 g/dL (ref 32.0–36.0)
MCV: 89.3 fL (ref 80.0–100.0)
MPV: 10.7 fL (ref 7.5–12.5)
PLATELETS: 217 10*3/uL (ref 140–400)
RBC: 4.28 MIL/uL (ref 3.80–5.10)
RDW: 14.5 % (ref 11.0–15.0)
WBC: 5.1 10*3/uL (ref 3.8–10.8)

## 2015-12-30 ENCOUNTER — Other Ambulatory Visit: Payer: Self-pay | Admitting: Family Medicine

## 2016-01-01 ENCOUNTER — Other Ambulatory Visit: Payer: Self-pay | Admitting: Orthopedic Surgery

## 2016-01-01 DIAGNOSIS — M5416 Radiculopathy, lumbar region: Secondary | ICD-10-CM

## 2016-01-21 ENCOUNTER — Inpatient Hospital Stay: Admission: RE | Admit: 2016-01-21 | Payer: Medicare Other | Source: Ambulatory Visit

## 2016-01-22 ENCOUNTER — Encounter: Payer: Self-pay | Admitting: Internal Medicine

## 2016-01-23 ENCOUNTER — Ambulatory Visit (INDEPENDENT_AMBULATORY_CARE_PROVIDER_SITE_OTHER): Payer: Medicare Other | Admitting: Internal Medicine

## 2016-01-23 ENCOUNTER — Encounter: Payer: Self-pay | Admitting: Internal Medicine

## 2016-01-23 VITALS — BP 126/72 | HR 65 | Ht 65.0 in | Wt 257.2 lb

## 2016-01-23 DIAGNOSIS — J302 Other seasonal allergic rhinitis: Secondary | ICD-10-CM | POA: Diagnosis not present

## 2016-01-23 DIAGNOSIS — J3089 Other allergic rhinitis: Secondary | ICD-10-CM

## 2016-01-23 DIAGNOSIS — G4733 Obstructive sleep apnea (adult) (pediatric): Secondary | ICD-10-CM

## 2016-01-23 DIAGNOSIS — J4520 Mild intermittent asthma, uncomplicated: Secondary | ICD-10-CM | POA: Diagnosis not present

## 2016-01-23 MED ORDER — ALBUTEROL SULFATE HFA 108 (90 BASE) MCG/ACT IN AERS: 2.0000 | INHALATION_SPRAY | Freq: Four times a day (QID) | RESPIRATORY_TRACT | 11 refills | Status: DC | PRN

## 2016-01-23 NOTE — Patient Instructions (Addendum)
Script sent refilling albuterol rescue inhaler for asthma use if needed  Order- [facility]- please evaluate eligibility for replacement of old CPAP machine.                         CPAP 15, mask of choice, humidifier, supplies, AirView  Dx OSA  For your allergic nose- try an otc antihistamine like Claritin/ loratadine or Allegra/ fexofenadine once daily if needed                                        Try an otc nasal spray Flonase/ fluticasone    1-2 puffs into each nostril once daily at bedtime

## 2016-01-23 NOTE — Progress Notes (Signed)
HPI  F former smoker followed for OSA/ UPPP/CPAP, allergic rhinitis, complicated by HBP, GERD   01/23/15- 63 yoF former smoker followed for OSA/ UPPP/CPAP, allergic rhinitis, complicated by HBP, GERD CPAP 15/ [facility] FOLLOWS FOR: Pt wears CPAP every night for about 6-9 hours; DME is [facility]. Pt does not need an order for new supplies at this time.  Quality of life is definitely better with CPAP Asks refill for albuterol rescue inhaler. She denies ever being diagnosed with asthma previously. Admits occasional chest tightness and slight wheezing probably with weather and temperature changes, virus colds and possibly pollen seasons.  01/23/2016-65 year old female former smoker followed for OSA/UPPP/CPAP, allergic rhinitis, asthma, complicated by HBP, GERD, DM 2 CPAP 15/[facility] pt states she gets at least 7 hours of sleep. pt has runny nose and seasonal allergies says her otc meds makes her sleepy. pt not sure if she has a cold or if it is her allergies.  Bothersome nasal drainage causes her to take the mask off many nights. Download 74%/4 hours, AHI 3.2/hour.  ROS-see HPI Constitutional:   No-   weight loss, night sweats, fevers, chills, fatigue, lassitude. HEENT:   No-  headaches, difficulty swallowing, tooth/dental problems, sore throat,       No- sneezing, itching, ear ache, + nasal congestion, post nasal drip,  CV:  No-   chest pain, orthopnea, PND, swelling in lower extremities, anasarca, dizziness, palpitations Resp: + shortness of breath with exertion or at rest.               productive cough,  + non-productive cough,  No- coughing up of blood.              No-   change in color of mucus.  No- wheezing.   Skin: No-   rash or lesions. GI:  No-   heartburn, indigestion, abdominal pain, nausea, vomiting, GU:  MS:  No-   joint pain or swelling.   Neuro-     nothing unusual Psych:  No- change in mood or affect. No depression or anxiety.  No memory loss.  OBJ- Physical Exam General-  Alert, Oriented, Affect-appropriate, Distress- none acute, + overweight Skin- diffuse spotting- nevi or [place] Lymphadenopathy- none Head- atraumatic            Eyes- Gross vision intact, PERRLA, conjunctivae and secretions clear            Ears- Hearing, canals-normal            Nose- Clear, no-Septal dev,  sniffing, +mucus, polyps, erosion, perforation             Throat- Mallampati III- s/p UPPP , mucosa clear , drainage- none, tonsils-  atrophic Neck- flexible , trachea midline, no stridor , thyroid nl, carotid no bruit Chest - symmetrical excursion , unlabored           Heart/CV- RRR , no murmur , no gallop  , no rub, nl s1 s2                           - JVD- none , edema- none, stasis changes- none, varices- none           Lung- clear to P&A, wheeze- none, cough with deep breath ,  dullness-none, rub- none           Chest wall-  Abd-  Br/ Gen/ Rectal- Not done, not indicated Extrem- cyanosis- none, clubbing, none, atrophy- none, strength- nl Neuro-  grossly intact to observation

## 2016-01-24 NOTE — Assessment & Plan Note (Addendum)
She meets minimum criteria but I encouraged her to try to wear CPAP more nearly all night every night. We will address her complaint of postnasal drainage to see if this really helps. She says she definitely feels better using CPAP.

## 2016-01-24 NOTE — Assessment & Plan Note (Signed)
We were to try to emphasize regular use of simple antihistamine and nasal spray

## 2016-01-24 NOTE — Assessment & Plan Note (Signed)
Very occasional need for rescue inhaler. Plan-refill albuterol HFA to keep it available as discussed.

## 2016-02-01 ENCOUNTER — Ambulatory Visit
Admission: RE | Admit: 2016-02-01 | Discharge: 2016-02-01 | Disposition: A | Discharge status: Home / Self Care | Payer: Medicare Other | Source: Ambulatory Visit | Attending: Orthopedic Surgery | Admitting: Orthopedic Surgery

## 2016-02-01 DIAGNOSIS — M5416 Radiculopathy, lumbar region: Secondary | ICD-10-CM

## 2016-02-25 ENCOUNTER — Ambulatory Visit (INDEPENDENT_AMBULATORY_CARE_PROVIDER_SITE_OTHER): Payer: Medicare Other | Admitting: Family Medicine

## 2016-02-25 ENCOUNTER — Ambulatory Visit (INDEPENDENT_AMBULATORY_CARE_PROVIDER_SITE_OTHER): Payer: Medicare Other

## 2016-02-25 ENCOUNTER — Telehealth: Payer: Self-pay

## 2016-02-25 VITALS — BP 126/80 | HR 74 | Temp 98.5°F | Resp 18 | Ht 65.0 in | Wt 261.0 lb

## 2016-02-25 DIAGNOSIS — S59902A Unspecified injury of left elbow, initial encounter: Secondary | ICD-10-CM

## 2016-02-25 DIAGNOSIS — E119 Type 2 diabetes mellitus without complications: Secondary | ICD-10-CM | POA: Diagnosis not present

## 2016-02-25 DIAGNOSIS — M542 Cervicalgia: Secondary | ICD-10-CM | POA: Diagnosis not present

## 2016-02-25 LAB — POCT GLYCOSYLATED HEMOGLOBIN (HGB A1C): Hemoglobin A1C: 6.3

## 2016-02-25 MED ORDER — GABAPENTIN 300 MG PO CAPS: 300.0000 mg | ORAL_CAPSULE | Freq: Every day | ORAL | 3 refills | Status: DC

## 2016-02-25 MED ORDER — TIZANIDINE HCL 4 MG PO CAPS: 4.0000 mg | ORAL_CAPSULE | Freq: Three times a day (TID) | ORAL | 1 refills | Status: DC | PRN

## 2016-02-25 MED ORDER — BLOOD GLUCOSE MONITOR KIT: PACK | 0 refills | Status: DC

## 2016-02-25 NOTE — Telephone Encounter (Signed)
Called to pharmacy 

## 2016-02-25 NOTE — Patient Instructions (Addendum)
IF you received an x-ray today, you will receive an invoice from [facility]. Please contact [facility] at [phone] with questions or concerns regarding your invoice.   IF you received labwork today, you will receive an invoice from [facility]. Please contact [facility] at [phone] with questions or concerns regarding your invoice.   Our billing staff will not be able to assist you with questions regarding bills from these companies.  You will be contacted with the lab results as soon as they are available. The fastest way to get your results is to activate your My Chart account. Instructions are located on the last page of this paperwork. If you have not heard from us regarding the results in 2 weeks, please contact this office.     Cervical Radiculopathy Introduction Cervical radiculopathy happens when a nerve in the neck (cervical nerve) is pinched or bruised. This condition can develop because of an injury or as part of the normal aging process. Pressure on the cervical nerves can cause pain or numbness that runs from the neck all the way down into the arm and fingers. Usually, this condition gets better with rest. Treatment may be needed if the condition does not improve. What are the causes? This condition may be caused by:  Injury.  Slipped (herniated) disk.  Muscle tightness in the neck because of overuse.  Arthritis.  Breakdown or degeneration in the bones and joints of the spine (spondylosis) due to aging.  Bone spurs that may develop near the cervical nerves. What are the signs or symptoms? Symptoms of this condition include:  Pain that runs from the neck to the arm and hand. The pain can be severe or irritating. It may be worse when the neck is moved.  Numbness or weakness in the affected arm and hand. How is this diagnosed? This condition may be diagnosed based on symptoms, medical history, and a physical exam. You may also have tests,  including:  X-rays.  CT scan.  MRI.  Electromyogram (EMG).  Nerve conduction tests. How is this treated? In many cases, treatment is not needed for this condition. With rest, the condition usually gets better over time. If treatment is needed, options may include:  Wearing a soft neck collar for short periods of time.  Physical therapy to strengthen your neck muscles.  Medicines, such as NSAIDs, oral corticosteroids, or spinal injections.  Surgery. This may be needed if other treatments do not help. Various types of surgery may be done depending on the cause of your problems. Follow these instructions at home: Managing pain  Take over-the-counter and prescription medicines only as told by your health care provider.  If directed, apply ice to the affected area.  Put ice in a plastic bag.  Place a towel between your skin and the bag.  Leave the ice on for 20 minutes, 2-3 times per day.  If ice does not help, you can try using heat. Take a warm shower or warm bath, or use a heat pack as told by your health care provider.  Try a gentle neck and shoulder massage to help relieve symptoms. Activity  Rest as needed. Follow instructions from your health care provider about any restrictions on activities.  Do stretching and strengthening exercises as told by your health care provider or physical therapist. General instructions  If you were given a soft collar, wear it as told by your health care provider.  Use a flat pillow when you sleep.  Keep all  follow-up visits as told by your health care provider. This is important. Contact a health care provider if:  Your condition does not improve with treatment. Get help right away if:  Your pain gets much worse and cannot be controlled with medicines.  You have weakness or numbness in your hand, arm, face, or leg.  You have a high fever.  You have a stiff, rigid neck.  You lose control of your bowels or your bladder (have  incontinence).  You have trouble with walking, balance, or speaking. This information is not intended to replace advice given to you by your health care provider. Make sure you discuss any questions you have with your health care provider. Document Released: 11/18/2000 Document Revised: 08/01/2015 Document Reviewed: 04/19/2014  2017 Elsevier   Neck Exercises Neck exercises can be important for many reasons:  They can help you to improve and maintain flexibility in your neck. This can be especially important as you age.  They can help to make your neck stronger. This can make movement easier.  They can reduce or prevent neck pain.  They may help your upper back. Ask your health care provider which neck exercises would be best for you. Exercises Neck Press  Repeat this exercise 10 times. Do it first thing in the morning and right before bed or as told by your health care provider. 1. Lie on your back on a firm bed or on the floor with a pillow under your head. 2. Use your neck muscles to push your head down on the pillow and straighten your spine. 3. Hold the position as well as you can. Keep your head facing up and your chin tucked. 4. Slowly count to 5 while holding this position. 5. Relax for a few seconds. Then repeat. Isometric Strengthening  Do a full set of these exercises 2 times a day or as told by your health care provider. 1. Sit in a supportive chair and place your hand on your forehead. 2. Push forward with your head and neck while pushing back with your hand. Hold for 10 seconds. 3. Relax. Then repeat the exercise 3 times. 4. Next, do thesequence again, this time putting your hand against the back of your head. Use your head and neck to push backward against the hand pressure. 5. Finally, do the same exercise on either side of your head, pushing sideways against the pressure of your hand. Prone Head Lifts  Repeat this exercise 5 times. Do this 2 times a day or as told  by your health care provider. 1. Lie face-down, resting on your elbows so that your chest and upper back are raised. 2. Start with your head facing downward, near your chest. Position your chin either on or near your chest. 3. Slowly lift your head upward. Lift until you are looking straight ahead. Then continue lifting your head as far back as you can stretch. 4. Hold your head up for 5 seconds. Then slowly lower it to your starting position. Supine Head Lifts  Repeat this exercise 8-10 times. Do this 2 times a day or as told by your health care provider. 1. Lie on your back, bending your knees to point to the ceiling and keeping your feet flat on the floor. 2. Lift your head slowly off the floor, raising your chin toward your chest. 3. Hold for 5 seconds. 4. Relax and repeat. Scapular Retraction  Repeat this exercise 5 times. Do this 2 times a day or as told by  your health care provider. 1. Stand with your arms at your sides. Look straight ahead. 2. Slowly pull both shoulders backward and downward until you feel a stretch between your shoulder blades in your upper back. 3. Hold for 10-30 seconds. 4. Relax and repeat. Contact a health care provider if:  Your neck pain or discomfort gets much worse when you do an exercise.  Your neck pain or discomfort does not improve within 2 hours after you exercise. If you have any of these problems, stop exercising right away. Do not do the exercises again unless your health care provider says that you can. Get help right away if:  You develop sudden, severe neck pain. If this happens, stop exercising right away. Do not do the exercises again unless your health care provider says that you can. Exercises Neck Stretch  Repeat this exercise 3-5 times. 1. Do this exercise while standing or while sitting in a chair. 2. Place your feet flat on the floor, shoulder-width apart. 3. Slowly turn your head to the right. Turn it all the way to the right so you  can look over your right shoulder. Do not tilt or tip your head. 4. Hold this position for 10-30 seconds. 5. Slowly turn your head to the left, to look over your left shoulder. 6. Hold this position for 10-30 seconds. Neck Retraction  Repeat this exercise 8-10 times. Do this 3-4 times a day or as told by your health care provider. 1. Do this exercise while standing or while sitting in a sturdy chair. 2. Look straight ahead. Do not bend your neck. 3. Use your fingers to push your chin backward. Do not bend your neck for this movement. Continue to face straight ahead. If you are doing the exercise properly, you will feel a slight sensation in your throat and a stretch at the back of your neck. 4. Hold the stretch for 1-2 seconds. Relax and repeat. This information is not intended to replace advice given to you by your health care provider. Make sure you discuss any questions you have with your health care provider. Document Released: 02/04/2015 Document Revised: 08/01/2015 Document Reviewed: 09/03/2014 Elsevier Interactive Patient Education  2017 [employer]

## 2016-02-25 NOTE — Telephone Encounter (Signed)
Needs whole rx for glucometer which has strips and lancets with it - was printed at visit but pt left prior to receiving so please fax in to pharmacy.

## 2016-02-25 NOTE — Telephone Encounter (Signed)
Needs lancets to pharmacy [facility] on [address]

## 2016-02-25 NOTE — Progress Notes (Signed)
Subjective:  By signing my name below, I, [name], attest that this documentation has been prepared under the direction and in the presence of Delman Cheadle, MD Electronically Signed: Ladene Artist, ED Scribe 02/25/2016 at 12:55 PM.   Patient ID: Jocelyn Little, female    DOB: 20-Oct-1950, 65 y.o.   MRN: [id number]  Chief Complaint  Patient presents with  . Neck Pain    left  . Shoulder Pain  . Flank Pain   HPI HPI Comments: Jocelyn Little is a 65 y.o. female who presents to the [facility] complaining of left neck shoulder and elbow pain.  Diclofenac not helping much. No weakness or numbness.   Does not check cbgs at home.  Past Medical History:  Diagnosis Date  . Diabetes mellitus without complication ([place])   . GERD (gastroesophageal reflux disease)   . High cholesterol   . HTN (hypertension)   . OSA (obstructive sleep apnea)   . Rhinitis    Current Outpatient Prescriptions on File Prior to Visit  Medication Sig Dispense Refill  . albuterol (PROVENTIL HFA;VENTOLIN HFA) 108 (90 Base) MCG/ACT inhaler Inhale 2 puffs into the lungs every 6 (six) hours as needed for wheezing. 1 Inhaler 11  . amLODipine (NORVASC) 5 MG tablet Take 5 mg by mouth daily.    Marland Kitchen aspirin 81 MG tablet Take 81 mg by mouth daily.    Marland Kitchen BRINTELLIX 10 MG TABS Take 1 tablet by mouth daily.  0  . Cyanocobalamin (B12 LIQUID HEALTH BOOSTER PO) Take 1 Bottle by mouth every other day.     . diclofenac (VOLTAREN) 75 MG EC tablet take 1 tablet by mouth twice a day 60 tablet 1  . esomeprazole (NEXIUM) 40 MG capsule Take 40 mg by mouth as needed (for indigestion).     Marland Kitchen lovastatin (MEVACOR) 20 MG tablet Take 20 mg by mouth at bedtime.    . metFORMIN (GLUCOPHAGE) 500 MG tablet Take 1 tablet by mouth daily.  0  . metoprolol (LOPRESSOR) 50 MG tablet Take 50 mg by mouth daily.    . Multiple Vitamin (MULTIVITAMIN) tablet Take 1 tablet by mouth every other day.     . RESTASIS  0.05 % ophthalmic emulsion Place 2 drops into both eyes 2 (two) times daily.  0   No current facility-administered medications on file prior to visit.    Allergies  Allergen Reactions  . Citrus Itching  . Dust Mite Extract Other (See Comments)    Sneezing, Runny nose   . Naproxen     Itching after taking last night   . Other Itching    Acidic foods   Review of Systems  Constitutional: Positive for activity change. Negative for chills, fever and unexpected weight change.  Genitourinary: Positive for flank pain.  Musculoskeletal: Positive for arthralgias, gait problem and neck pain. Negative for back pain, joint swelling and myalgias.  Skin: Negative for color change and rash.  Neurological: Negative for weakness and numbness.      Objective:   Physical Exam  Constitutional: She is oriented to person, place, and time. She appears well-developed and well-nourished. No distress.  HENT:  Head: Normocephalic and atraumatic.  Right Ear: External ear normal.  Left Ear: External ear normal.  Eyes: Conjunctivae and EOM are normal. No scleral icterus.  Neck: Normal range of motion. Neck supple. No tracheal deviation present. No thyromegaly present.  Cardiovascular: Normal rate, regular rhythm, normal heart sounds and intact distal pulses.   Pulmonary/Chest: Effort  normal and breath sounds normal. No respiratory distress.  Musculoskeletal: She exhibits no edema.       Left shoulder: Normal.       Left elbow: Normal. She exhibits normal range of motion.       Cervical back: She exhibits decreased range of motion, tenderness, pain and spasm. She exhibits no bony tenderness, no edema, no deformity and normal pulse.       Right upper arm: Normal.  Lymphadenopathy:    She has no cervical adenopathy.  Neurological: She is alert and oriented to person, place, and time. She has normal reflexes.  Skin: Skin is warm and dry. She is not diaphoretic. No erythema.  Psychiatric: She has a normal mood  and affect. Her behavior is normal.  Nursing note and vitals reviewed.  BP 126/80 (BP Location: Right Arm, Patient Position: Sitting, Cuff Size: Large)   Pulse 74   Temp 98.5 F (36.9 C) (Oral)   Resp 18   Ht 5' 5"  (1.651 m)   Wt 261 lb (118.4 kg)   SpO2 97%   BMI 43.43 kg/m     Dg Cervical Spine 2 Or 3 Views  Result Date: 02/25/2016 CLINICAL DATA:  Neck pain of unknown origin. EXAM: CERVICAL SPINE - 2-3 VIEW COMPARISON:  12/07/2011 FINDINGS: Spondylosis with ventral endplate spurring and borderline disc narrowing at C3-4 to C6-7. C3-4 involvement and degree of spurring has progressed since 2013. No evidence of fracture deformity or focal bone lesion. Chronic supraspinous ossification. No prevertebral thickening. Small bilateral cervical ribs. IMPRESSION: C3-4 to C6-7 spondylosis that has progressed from 2013. Electronically Signed   By: Monte Fantasia M.D.   On: 02/25/2016 13:23   Dg Elbow 2 Views Left  Result Date: 02/25/2016 CLINICAL DATA:  Left elbow pain after trauma 6 months ago. EXAM: LEFT ELBOW - 2 VIEW COMPARISON:  None. FINDINGS: There is no evidence of fracture, dislocation, or joint effusion. There is no evidence of arthropathy or other focal bone abnormality. Soft tissues are unremarkable. IMPRESSION: No definite abnormality seen in the left elbow. Electronically Signed   By: Marijo Conception, M.D.   On: 02/25/2016 13:22   Results for orders placed or performed in visit on 02/25/16  POCT glycosylated hemoglobin (Hb A1C)  Result Value Ref Range   Hemoglobin A1C 6.3     Assessment & Plan:   1. Cervicalgia   2. Elbow injury, left, initial encounter   3. Diabetes mellitus without complication ([place]) - well controlled on metformin 500 qd. Needs glucometer - rxed.   Suspect her left arm pain is due to left cervical radiculopathy. DM well controlled but try to avoid course of oral steroids so try gabapentin and zanaflex. Cont diclofenac 75 bid. Start heat and gentle  stretching.   If sxs do not resolve, RTC for further eval as may need advanced imaging and referral.  Orders Placed This Encounter  Procedures  . DG Elbow 2 Views Left    Standing Status:   Future    Number of Occurrences:   1    Standing Expiration Date:   02/24/2017    Order Specific Question:   Reason for Exam (SYMPTOM  OR DIAGNOSIS REQUIRED)    Answer:   hit left olecranon on hard surface 6 mos prior with continuing pain radiating proximally since    Order Specific Question:   Preferred imaging location?    Answer:   External  . DG Cervical Spine 2 or 3 views    Standing  Status:   Future    Number of Occurrences:   1    Standing Expiration Date:   02/24/2017    Order Specific Question:   Reason for Exam (SYMPTOM  OR DIAGNOSIS REQUIRED)    Answer:   hit left olecranon on hard surface 6 mos prior with continuing pain radiating proximally since    Order Specific Question:   Preferred imaging location?    Answer:   External  . POCT glycosylated hemoglobin (Hb A1C)    Meds ordered this encounter  Medications  . blood glucose meter kit and supplies KIT    Sig: Dispense based on patient and insurance preference. Use to check cbgs qd - fasting in the morning or 2 hours after a meal, E11.9    Dispense:  1 each    Refill:  0    Order Specific Question:   Number of strips    Answer:   1000    Order Specific Question:   Number of lancets    Answer:   1000  . gabapentin (NEURONTIN) 300 MG capsule    Sig: Take 1 capsule (300 mg total) by mouth at bedtime.    Dispense:  30 capsule    Refill:  3  . tiZANidine (ZANAFLEX) 4 MG capsule    Sig: Take 1 capsule (4 mg total) by mouth 3 (three) times daily as needed for muscle spasms (neck muscle pain). Will cause SEDATION    Dispense:  30 capsule    Refill:  1    I personally performed the services described in this documentation, which was scribed in my presence. The recorded information has been reviewed and considered, and addended by me  as needed.   Delman Cheadle, M.D.  [facility] [address] [place], [place] [address] [phone] phone [phone] fax  03/09/16 11:11 PM

## 2016-02-27 ENCOUNTER — Telehealth: Payer: Self-pay

## 2016-02-27 NOTE — Telephone Encounter (Signed)
Got fax from pharm that tizanidine Rx needs a PA. This is normally preferred by Medicare plans which pt has. Called pharm to check to see if PA is really needed, or if maybe tablets are covered instead of capsules. Pharm ran tablets through and they are covered so will dispense those.

## 2016-03-04 ENCOUNTER — Ambulatory Visit: Payer: Medicare Other | Admitting: Podiatry

## 2016-03-12 ENCOUNTER — Encounter: Payer: Medicare Other | Admitting: Family Medicine

## 2016-03-12 ENCOUNTER — Ambulatory Visit: Payer: Medicare Other | Admitting: Podiatry

## 2016-03-17 ENCOUNTER — Other Ambulatory Visit: Payer: Self-pay | Admitting: Family Medicine

## 2016-03-17 ENCOUNTER — Other Ambulatory Visit: Payer: Self-pay

## 2016-03-17 NOTE — Telephone Encounter (Signed)
PATIENT WOULD LIKE DR. SHAW TO KNOW THAT SHE HAS BEEN OUT OF HER MEDICINE FOR HER NERVES FOR ABOUT A WEEK. SHE NEEDS TO GET A REFILL ON BRINTELLIX (SHE SAID IT IS CALLED SOMETHING ELSE NOW AND SHE TAKES 20 MG). SHE SAID SHE WAS GOING TO ASK FOR REFILLS WHEN SHE HAD AN APPOINTMENT WITH DR. SHAW ON JAN. 4th, BUT THEY DID NOT LET HER CHECK IN BECAUSE THEY SAID SHE WAS 15 MINUTES LATE. BEST PHONE [phone] (CELL) PHARMACY CHOICE IS [facility] ON [address]. MBC

## 2016-03-17 NOTE — Telephone Encounter (Signed)
Pt called in requesting refill.  See phone message.

## 2016-03-18 ENCOUNTER — Other Ambulatory Visit: Payer: Self-pay | Admitting: Family Medicine

## 2016-03-18 MED ORDER — VORTIOXETINE HBR 20 MG PO TABS: 20.0000 mg | ORAL_TABLET | Freq: Every day | ORAL | 1 refills | Status: DC

## 2016-03-19 ENCOUNTER — Encounter: Payer: Self-pay | Admitting: Internal Medicine

## 2016-03-20 ENCOUNTER — Encounter (HOSPITAL_COMMUNITY): Payer: Self-pay

## 2016-03-20 ENCOUNTER — Emergency Department (HOSPITAL_COMMUNITY)
Admission: EM | Admit: 2016-03-20 | Discharge: 2016-03-20 | Disposition: A | Discharge status: Home / Self Care | Payer: Medicare Other | Attending: Emergency Medicine | Admitting: Emergency Medicine

## 2016-03-20 ENCOUNTER — Emergency Department (HOSPITAL_COMMUNITY): Payer: Medicare Other

## 2016-03-20 DIAGNOSIS — Z79899 Other long term (current) drug therapy: Secondary | ICD-10-CM | POA: Insufficient documentation

## 2016-03-20 DIAGNOSIS — Z7982 Long term (current) use of aspirin: Secondary | ICD-10-CM | POA: Insufficient documentation

## 2016-03-20 DIAGNOSIS — J111 Influenza due to unidentified influenza virus with other respiratory manifestations: Secondary | ICD-10-CM | POA: Insufficient documentation

## 2016-03-20 DIAGNOSIS — E119 Type 2 diabetes mellitus without complications: Secondary | ICD-10-CM | POA: Diagnosis not present

## 2016-03-20 DIAGNOSIS — Z7984 Long term (current) use of oral hypoglycemic drugs: Secondary | ICD-10-CM | POA: Diagnosis not present

## 2016-03-20 DIAGNOSIS — Z87891 Personal history of nicotine dependence: Secondary | ICD-10-CM | POA: Diagnosis not present

## 2016-03-20 DIAGNOSIS — I10 Essential (primary) hypertension: Secondary | ICD-10-CM | POA: Insufficient documentation

## 2016-03-20 DIAGNOSIS — R69 Illness, unspecified: Secondary | ICD-10-CM

## 2016-03-20 DIAGNOSIS — R05 Cough: Secondary | ICD-10-CM | POA: Diagnosis present

## 2016-03-20 MED ORDER — HYDROCODONE-ACETAMINOPHEN 5-325 MG PO TABS: 1.0000 | ORAL_TABLET | Freq: Four times a day (QID) | ORAL | 0 refills | Status: DC | PRN

## 2016-03-20 MED ORDER — OSELTAMIVIR PHOSPHATE 75 MG PO CAPS: 75.0000 mg | ORAL_CAPSULE | Freq: Two times a day (BID) | ORAL | 0 refills | Status: DC

## 2016-03-20 MED ORDER — ALBUTEROL SULFATE HFA 108 (90 BASE) MCG/ACT IN AERS
2.0000 | INHALATION_SPRAY | Freq: Once | RESPIRATORY_TRACT | Status: AC
  Administered 2016-03-20: 2 via RESPIRATORY_TRACT
  Filled 2016-03-20: qty 6.7

## 2016-03-20 MED ORDER — HYDROCODONE-ACETAMINOPHEN 5-325 MG PO TABS
1.0000 | ORAL_TABLET | Freq: Once | ORAL | Status: AC
  Administered 2016-03-20: 1 via ORAL
  Filled 2016-03-20: qty 1

## 2016-03-20 MED ORDER — OSELTAMIVIR PHOSPHATE 75 MG PO CAPS
75.0000 mg | ORAL_CAPSULE | Freq: Once | ORAL | Status: AC
  Administered 2016-03-20: 75 mg via ORAL
  Filled 2016-03-20: qty 1

## 2016-03-20 NOTE — ED Provider Notes (Signed)
[facility] DEPT Provider Note   CSN: [id number] Arrival date & time: 03/20/16  1715  By signing my name below, I, Oleh Genin, attest that this documentation has been prepared under the direction and in the presence of [employer], PA-C.  Electronically Signed: Oleh Genin, ED Scribe. 03/20/16. 7:17 PM.    History   Chief Complaint Chief Complaint  Patient presents with  . Nasal Congestion  . Cough    HPI Jocelyn Little is a 66 y.o. female with history of OSA, HTN, and DM who presents to the ED with intermittent cough and rhinorrhea for "around the last 1.5 months since I got my flu shot". The patient states that for the last week she has also developed nausea without vomiting, subjective fevers and chills; as well as generalized weakness and muscle aching in the last 2 days. She is taking Nyquil at home, however no anti-pyretics since 3 days ago. She has had contact with a small child with similar symptoms earlier this week. She denies chest pain, or any other associated symptoms.   The history is provided by the patient. No language interpreter was used.    Past Medical History:  Diagnosis Date  . Diabetes mellitus without complication ([place])   . GERD (gastroesophageal reflux disease)   . High cholesterol   . HTN (hypertension)   . OSA (obstructive sleep apnea)   . Rhinitis     Patient Active Problem List   Diagnosis Date Noted  . Diabetes mellitus without complication ([place]) 27/05/5007  . Asthma, mild intermittent, well-controlled 01/23/2015  . Acute confusional state 12/07/2014  . Meralgia paresthetica of right side 01/29/2014  . HYPERTENSION 05/16/2009  . RHINOSINUSITIS, ACUTE 05/16/2009  . G E R D 05/16/2009  . BRONCHITIS, ACUTE 03/15/2009  . Obstructive sleep apnea 03/12/2009  . Seasonal and perennial allergic rhinitis 03/12/2009    Past Surgical History:  Procedure Laterality Date  . CHOLECYSTECTOMY    . LIPOMA EXCISION    .  TOTAL ABDOMINAL HYSTERECTOMY    . UVULOPALATOPHARYNGOPLASTY      OB History    No data available       Home Medications    Prior to Admission medications   Medication Sig Start Date End Date Taking? Authorizing Provider  albuterol (PROVENTIL HFA;VENTOLIN HFA) 108 (90 Base) MCG/ACT inhaler Inhale 2 puffs into the lungs every 6 (six) hours as needed for wheezing. 01/23/16   Deneise Lever, MD  amLODipine (NORVASC) 5 MG tablet Take 5 mg by mouth daily.    Historical Provider, MD  aspirin 81 MG tablet Take 81 mg by mouth daily.    Historical Provider, MD  blood glucose meter kit and supplies KIT Dispense based on patient and insurance preference. Use to check cbgs qd - fasting in the morning or 2 hours after a meal, E11.9 02/25/16   Shawnee Knapp, MD  Cyanocobalamin (B12 LIQUID HEALTH BOOSTER PO) Take 1 Bottle by mouth every other day.     Historical Provider, MD  diclofenac (VOLTAREN) 75 MG EC tablet take 1 tablet by mouth twice a day 12/31/15   Shawnee Knapp, MD  esomeprazole (NEXIUM) 40 MG capsule Take 40 mg by mouth as needed (for indigestion).     Historical Provider, MD  gabapentin (NEURONTIN) 300 MG capsule Take 1 capsule (300 mg total) by mouth at bedtime. 02/25/16   Shawnee Knapp, MD  HYDROcodone-acetaminophen (NORCO/VICODIN) 5-325 MG tablet Take 1 tablet by mouth every 6 (six) hours as needed. 03/20/16  Leomar Westberg, PA-C  lovastatin (MEVACOR) 20 MG tablet Take 20 mg by mouth at bedtime.    Historical Provider, MD  metFORMIN (GLUCOPHAGE) 500 MG tablet Take 1 tablet by mouth daily. 11/15/13   Historical Provider, MD  metoprolol (LOPRESSOR) 50 MG tablet Take 50 mg by mouth daily. 08/06/12   Kingsley Spittle, MD  Multiple Vitamin (MULTIVITAMIN) tablet Take 1 tablet by mouth every other day.     Historical Provider, MD  oseltamivir (TAMIFLU) 75 MG capsule Take 1 capsule (75 mg total) by mouth every 12 (twelve) hours. 03/20/16   Dereke Neumann, PA-C  RESTASIS 0.05 % ophthalmic emulsion Place 2  drops into both eyes 2 (two) times daily. 11/14/13   Historical Provider, MD  tiZANidine (ZANAFLEX) 4 MG capsule Take 1 capsule (4 mg total) by mouth 3 (three) times daily as needed for muscle spasms (neck muscle pain). Will cause SEDATION 02/25/16   Shawnee Knapp, MD  vortioxetine HBr (TRINTELLIX) 20 MG TABS Take 20 mg by mouth daily. 03/18/16   Shawnee Knapp, MD    Family History Family History  Problem Relation Age of Onset  . Emphysema Father   . Rheum arthritis Father   . Lung cancer Father   . Heart disease Mother   . Asthma Brother   . Heart disease Brother   . Rheum arthritis Brother   . Diabetes Brother     Social History Social History  Substance Use Topics  . Smoking status: Former Smoker    Packs/day: 0.80    Years: 20.00    Types: Cigarettes    Quit date: 03/09/1998  . Smokeless tobacco: Never Used  . Alcohol use No     Comment: rarely     Allergies   Citrus; Dust mite extract; Naproxen; and Other   Review of Systems Review of Systems   A complete 10 system review of systems was obtained and all systems are negative except as noted in the HPI and PMH.    Physical Exam Updated Vital Signs BP 120/95   Pulse 83   Temp 98.7 F (37.1 C) (Oral)   Resp 16   Ht _0  (1.651 m)   Wt 116.1 kg   SpO2 98%   BMI 42.60 kg/m   Physical Exam  Constitutional: She is oriented to person, place, and time. She appears well-developed and well-nourished. No distress.  Nontoxic appearing  HENT:  Head: Normocephalic and atraumatic.  Mouth/Throat: Oropharynx is clear and moist.  Eyes: Conjunctivae and EOM are normal. Pupils are equal, round, and reactive to light.  Neck: Normal range of motion.  Cardiovascular: Normal rate, regular rhythm and intact distal pulses.   Pulmonary/Chest: Effort normal and breath sounds normal. No respiratory distress. She has no wheezes. She has no rales. She exhibits no tenderness.  Abdominal: Soft. She exhibits no distension and no mass. There  is no tenderness. There is no rebound and no guarding. No hernia.  Musculoskeletal: Normal range of motion.  Neurological: She is alert and oriented to person, place, and time.  Skin: Capillary refill takes less than 2 seconds. She is not diaphoretic.  Psychiatric: She has a normal mood and affect.  Nursing note and vitals reviewed.    ED Treatments / Results  DIAGNOSTIC STUDIES: Oxygen Saturation is 96 percent on room air which is normal by my interpretation.   COORDINATION OF CARE: 7:17 PM-Discussed next steps with pt. Pt verbalized understanding and is agreeable with the plan.    Labs (all labs  ordered are listed, but only abnormal results are displayed) Labs Reviewed - No data to display  EKG  EKG Interpretation None       Radiology Dg Chest 2 View  Result Date: 03/20/2016 CLINICAL DATA:  Sick with flu like symptoms. EXAM: CHEST  2 VIEW COMPARISON:  08/08/2014 FINDINGS: The heart size and mediastinal contours are within normal limits. Both lungs are clear. The visualized skeletal structures are unremarkable. IMPRESSION: No active cardiopulmonary disease. Electronically Signed   By: Misty Stanley M.D.   On: 03/20/2016 18:15    Procedures Procedures (including critical care time)  Medications Ordered in ED Medications  HYDROcodone-acetaminophen (NORCO/VICODIN) 5-325 MG per tablet 1 tablet (1 tablet Oral Given 03/20/16 1939)  oseltamivir (TAMIFLU) capsule 75 mg (75 mg Oral Given 03/20/16 1958)  albuterol (PROVENTIL HFA;VENTOLIN HFA) 108 (90 Base) MCG/ACT inhaler 2 puff (2 puffs Inhalation Given 03/20/16 1939)     Initial Impression / Assessment and Plan / ED Course  I have reviewed the triage vital signs and the nursing notes.  Pertinent labs & imaging results that were available during my care of the patient were reviewed by me and considered in my medical decision making (see chart for details).  Clinical Course     Vitals:   03/20/16 1729 03/20/16 1926  BP:  125/70 120/95  Pulse: 92 83  Resp: 20 16  Temp: 98.7 F (37.1 C)   TempSrc: Oral   SpO2: 96% 98%  Weight: 116.1 kg   Height: _0  (1.651 m)     Medications  HYDROcodone-acetaminophen (NORCO/VICODIN) 5-325 MG per tablet 1 tablet (1 tablet Oral Given 03/20/16 1939)  oseltamivir (TAMIFLU) capsule 75 mg (75 mg Oral Given 03/20/16 1958)  albuterol (PROVENTIL HFA;VENTOLIN HFA) 108 (90 Base) MCG/ACT inhaler 2 puff (2 puffs Inhalation Given 03/20/16 1939)    Jocelyn Little is 66 y.o. female presenting with 1 month of runny nose and cough. She developed myalgia and fatigue with tactile fever and chills over the last 2 days. I think she may have a flu superimposed on a bronchitis, given her underlying diabetes will start on Tamiflu, they will be Vicodin for pain control. Chest x-rays without infiltrate, given her reassuring vital signs I don't think we need blood work, she's not clinically dehydrated, no tachycardia. I have explained to her that it is very important that she follow with primary care doctor for recheck in one week and she is return to the ED for any new or worsening symptoms. Patient verbalized her understanding and teach back technique. She cares for her 40 month old grandson of advised her that she should not be having close contact with him at this time and to be on the look out for any symptoms of flu and that he will need to be treated immediately if he develops symptoms.  Evaluation does not show pathology that would require ongoing emergent intervention or inpatient treatment. Pt is hemodynamically stable and mentating appropriately. Discussed findings and plan with patient/guardian, who agrees with care plan. All questions answered. Return precautions discussed and outpatient follow up given.   Final Clinical Impressions(s) / ED Diagnoses   Final diagnoses:  Influenza-like illness    New Prescriptions Discharge Medication List as of 03/20/2016  7:44 PM    START  taking these medications   Details  HYDROcodone-acetaminophen (NORCO/VICODIN) 5-325 MG tablet Take 1 tablet by mouth every 6 (six) hours as needed., Starting Fri 03/20/2016, Print    oseltamivir (TAMIFLU) 75 MG capsule Take 1 capsule (  75 mg total) by mouth every 12 (twelve) hours., Starting Fri 03/20/2016, Print        I personally performed the services described in this documentation, which was scribed in my presence. The recorded information has been reviewed and is accurate.     Monico Blitz, PA-C 03/20/16 2004    Virgel Manifold, MD 03/30/16 1415

## 2016-03-20 NOTE — Discharge Instructions (Signed)
You can use the nebulizer by taking 2 puffs deeply into the lungs every 4-6 hours for shortness of breath or wheezing.  Please check in with her primary care doctor for recheck in the next week.  Return to the emergency room for any worsening or concerning symptoms including fast breathing, heart racing, confusion, vomiting.  Rest, cover your mouth when you cough and wash your hands frequently.   Push fluids: water or Gatorade, do not drink any soda, juice or caffeinated beverages.  For fever and pain control you can take Motrin (ibuprofen) as follows: 400 mg (this is normally 2 over the counter pills) every 4 hours with food.  Do not return to work until a day after your fever breaks.   Take Vicodin for cough and pain control, do not drink alcohol, drive, care for children or do other critical tasks while taking Vicodin

## 2016-03-20 NOTE — ED Triage Notes (Signed)
Per Pt, Pt is coming from home with complaints of cogh, congestion x1 month. Pt reports getting her flu shot and having felt this way since. Now complaining of body aches.

## 2016-04-23 ENCOUNTER — Encounter: Payer: Self-pay | Admitting: Adult Health

## 2016-04-27 ENCOUNTER — Encounter: Payer: Self-pay | Admitting: Adult Health

## 2016-04-27 ENCOUNTER — Ambulatory Visit (INDEPENDENT_AMBULATORY_CARE_PROVIDER_SITE_OTHER): Payer: Medicare Other | Admitting: Adult Health

## 2016-04-27 DIAGNOSIS — G4733 Obstructive sleep apnea (adult) (pediatric): Secondary | ICD-10-CM | POA: Diagnosis not present

## 2016-04-27 NOTE — Patient Instructions (Signed)
Continue on CPAP At bedtime  .  Wear for at least 4hr each night.  Saline nasal rinses As needed   Claritin 10mg  At bedtime  As needed  drainge .  follow up Dr. Maple HudsonYoung  In 6 months and As needed

## 2016-04-27 NOTE — Assessment & Plan Note (Signed)
Controlled on CPAP w/ good compliance   Plan  Patient Instructions  Continue on CPAP At bedtime  .  Wear for at least 4hr each night.  Saline nasal rinses As needed   Claritin 10mg  At bedtime  As needed  drainge .  follow up Dr. Maple HudsonYoung  In 6 months and As needed

## 2016-04-27 NOTE — Progress Notes (Signed)
_0  ID: Jocelyn Little, female    DOB: May 21, 1950, 66 y.o.   MRN: [id number]  Chief Complaint  Patient presents with  . Follow-up    OSA     Referring provider: Shawnee Knapp, MD  HPI: 66 yo female followed for OSA /UPPP/CPAP    04/27/2016 Follow up : OSA  Pt returns for 3 month follow up for OSA. She recently got new CPAP machine . Says she really likes the new machine. Says she is doing well. Wears each night for about 5hr each night.  Feels rested.  Download shows 77% usage. avg hrs is 6hr . AHI 2.8. Says she got the flu and also needed a new mask bc her mask ripped. Now has a new mask , likes it.  Does have stuffy nose and drainage in throat.    Allergies  Allergen Reactions  . Citrus Itching  . Dust Mite Extract Other (See Comments)    Sneezing, Runny nose   . Naproxen     Itching after taking last night   . Other Itching    Acidic foods    Immunization History  Administered Date(s) Administered  . Influenza Split 03/10/2011, 12/24/2015  . Influenza Whole 01/08/2008, 01/07/2010  . Influenza,inj,Quad PF,36+ Mos 01/22/2014, 01/23/2015, 12/05/2015    Past Medical History:  Diagnosis Date  . Diabetes mellitus without complication ([place])   . GERD (gastroesophageal reflux disease)   . High cholesterol   . HTN (hypertension)   . OSA (obstructive sleep apnea)   . Rhinitis     Tobacco History: History  Smoking Status  . Former Smoker  . Packs/day: 0.80  . Years: 20.00  . Types: Cigarettes  . Quit date: 03/09/1998  Smokeless Tobacco  . Never Used   Counseling given: Not Answered   Outpatient Encounter Prescriptions as of 04/27/2016  Medication Sig  . albuterol (PROVENTIL HFA;VENTOLIN HFA) 108 (90 Base) MCG/ACT inhaler Inhale 2 puffs into the lungs every 6 (six) hours as needed for wheezing.  Marland Kitchen amLODipine (NORVASC) 5 MG tablet Take 5 mg by mouth daily.  Marland Kitchen aspirin 81 MG tablet Take 81 mg by mouth daily.  . blood glucose meter kit and supplies  KIT Dispense based on patient and insurance preference. Use to check cbgs qd - fasting in the morning or 2 hours after a meal, E11.9  . esomeprazole (NEXIUM) 40 MG capsule Take 40 mg by mouth as needed (for indigestion).   Marland Kitchen lovastatin (MEVACOR) 20 MG tablet Take 20 mg by mouth at bedtime.  . metFORMIN (GLUCOPHAGE) 500 MG tablet Take 1 tablet by mouth daily.  . metoprolol (LOPRESSOR) 50 MG tablet Take 50 mg by mouth daily.  . Multiple Vitamin (MULTIVITAMIN) tablet Take 1 tablet by mouth every other day.   . oseltamivir (TAMIFLU) 75 MG capsule Take 1 capsule (75 mg total) by mouth every 12 (twelve) hours.  . RESTASIS 0.05 % ophthalmic emulsion Place 2 drops into both eyes 2 (two) times daily.  Marland Kitchen tiZANidine (ZANAFLEX) 4 MG capsule Take 1 capsule (4 mg total) by mouth 3 (three) times daily as needed for muscle spasms (neck muscle pain). Will cause SEDATION  . vortioxetine HBr (TRINTELLIX) 20 MG TABS Take 20 mg by mouth daily.  . [DISCONTINUED] Cyanocobalamin (B12 LIQUID HEALTH BOOSTER PO) Take 1 Bottle by mouth every other day.   . [DISCONTINUED] diclofenac (VOLTAREN) 75 MG EC tablet take 1 tablet by mouth twice a day  . [DISCONTINUED] gabapentin (NEURONTIN) 300 MG capsule Take 1 capsule (  300 mg total) by mouth at bedtime.  . [DISCONTINUED] HYDROcodone-acetaminophen (NORCO/VICODIN) 5-325 MG tablet Take 1 tablet by mouth every 6 (six) hours as needed.   No facility-administered encounter medications on file as of 04/27/2016.      Review of Systems  Constitutional:   No  weight loss, night sweats,  Fevers, chills,  +fatigue, or  lassitude.  HEENT:   No headaches,  Difficulty swallowing,  Tooth/dental problems, or  Sore throat,                No sneezing, itching, ear ache, +nasal congestion, post nasal drip,   CV:  No chest pain,  Orthopnea, PND, swelling in lower extremities, anasarca, dizziness, palpitations, syncope.   GI  No heartburn, indigestion, abdominal pain, nausea, vomiting,  diarrhea, change in bowel habits, loss of appetite, bloody stools.   Resp:   No wheezing.  No chest wall deformity  Skin: no rash or lesions.  GU: no dysuria, change in color of urine, no urgency or frequency.  No flank pain, no hematuria   MS:  No joint pain or swelling.  No decreased range of motion.  No back pain.    Physical Exam  BP 128/76 (BP Location: Left Arm, Cuff Size: Normal)   Pulse 63   Ht _0  (1.651 m)   Wt 117.2 kg (258 lb 6.4 oz)   SpO2 96%   BMI 43.00 kg/m   GEN: A/Ox3; pleasant , NAD, obese    HEENT:  [place]/AT,  EACs-clear, TMs-wnl, NOSE-clear, THROAT-clear, no lesions, no postnasal drip or exudate noted. Class 3 MP airway   NECK:  Supple w/ fair ROM; no JVD; normal carotid impulses w/o bruits; no thyromegaly or nodules palpated; no lymphadenopathy.    RESP  Clear  P & A; w/o, wheezes/ rales/ or rhonchi. no accessory muscle use, no dullness to percussion  CARD:  RRR, no m/r/g, no peripheral edema, pulses intact, no cyanosis or clubbing.  GI:   Soft & nt; nml bowel sounds; no organomegaly or masses detected.   Musco: Warm bil, no deformities or joint swelling noted.   Neuro: alert, no focal deficits noted.    Skin: Warm, no lesions or rashes    Lab Results:  CBC    Component Value Date/Time   WBC 5.1 12/05/2015 1714   RBC 4.28 12/05/2015 1714   HGB 12.7 12/05/2015 1714   HCT 38.2 12/05/2015 1714   PLT 217 12/05/2015 1714   MCV 89.3 12/05/2015 1714   MCH 29.7 12/05/2015 1714   MCHC 33.2 12/05/2015 1714   RDW 14.5 12/05/2015 1714   LYMPHSABS 1.6 05/22/2015 0950   MONOABS 0.4 05/22/2015 0950   EOSABS 0.1 05/22/2015 0950   BASOSABS 0.0 05/22/2015 0950    BMET    Component Value Date/Time   NA 142 12/05/2015 1717   K 4.2 12/05/2015 1717   CL 103 12/05/2015 1717   CO2 30 12/05/2015 1717   GLUCOSE 85 12/05/2015 1717   BUN 16 12/05/2015 1717   CREATININE 0.95 12/05/2015 1717   CALCIUM 8.7 12/05/2015 1717   GFRNONAA 63 12/05/2015 1717    GFRAA 73 12/05/2015 1717    BNP No results found for: BNP  ProBNP No results found for: PROBNP  Imaging: No results found.   Assessment & Plan:   Obstructive sleep apnea Controlled on CPAP w/ good compliance   Plan  Patient Instructions  Continue on CPAP At bedtime  .  Wear for at least 4hr each night.  Saline nasal rinses As needed   Claritin 52m At bedtime  As needed  drainge .  follow up Dr. YAnnamaria Boots In 6 months and As needed          TRexene Edison NP 04/27/2016

## 2016-05-13 IMAGING — CR DG CHEST 2V
2 series · 2 of 2 positions shown · non-contrast
Comparison: 08/06/2012

CLINICAL DATA: Hemoptysis.

EXAM:
CHEST  2 VIEW

[chest pa]
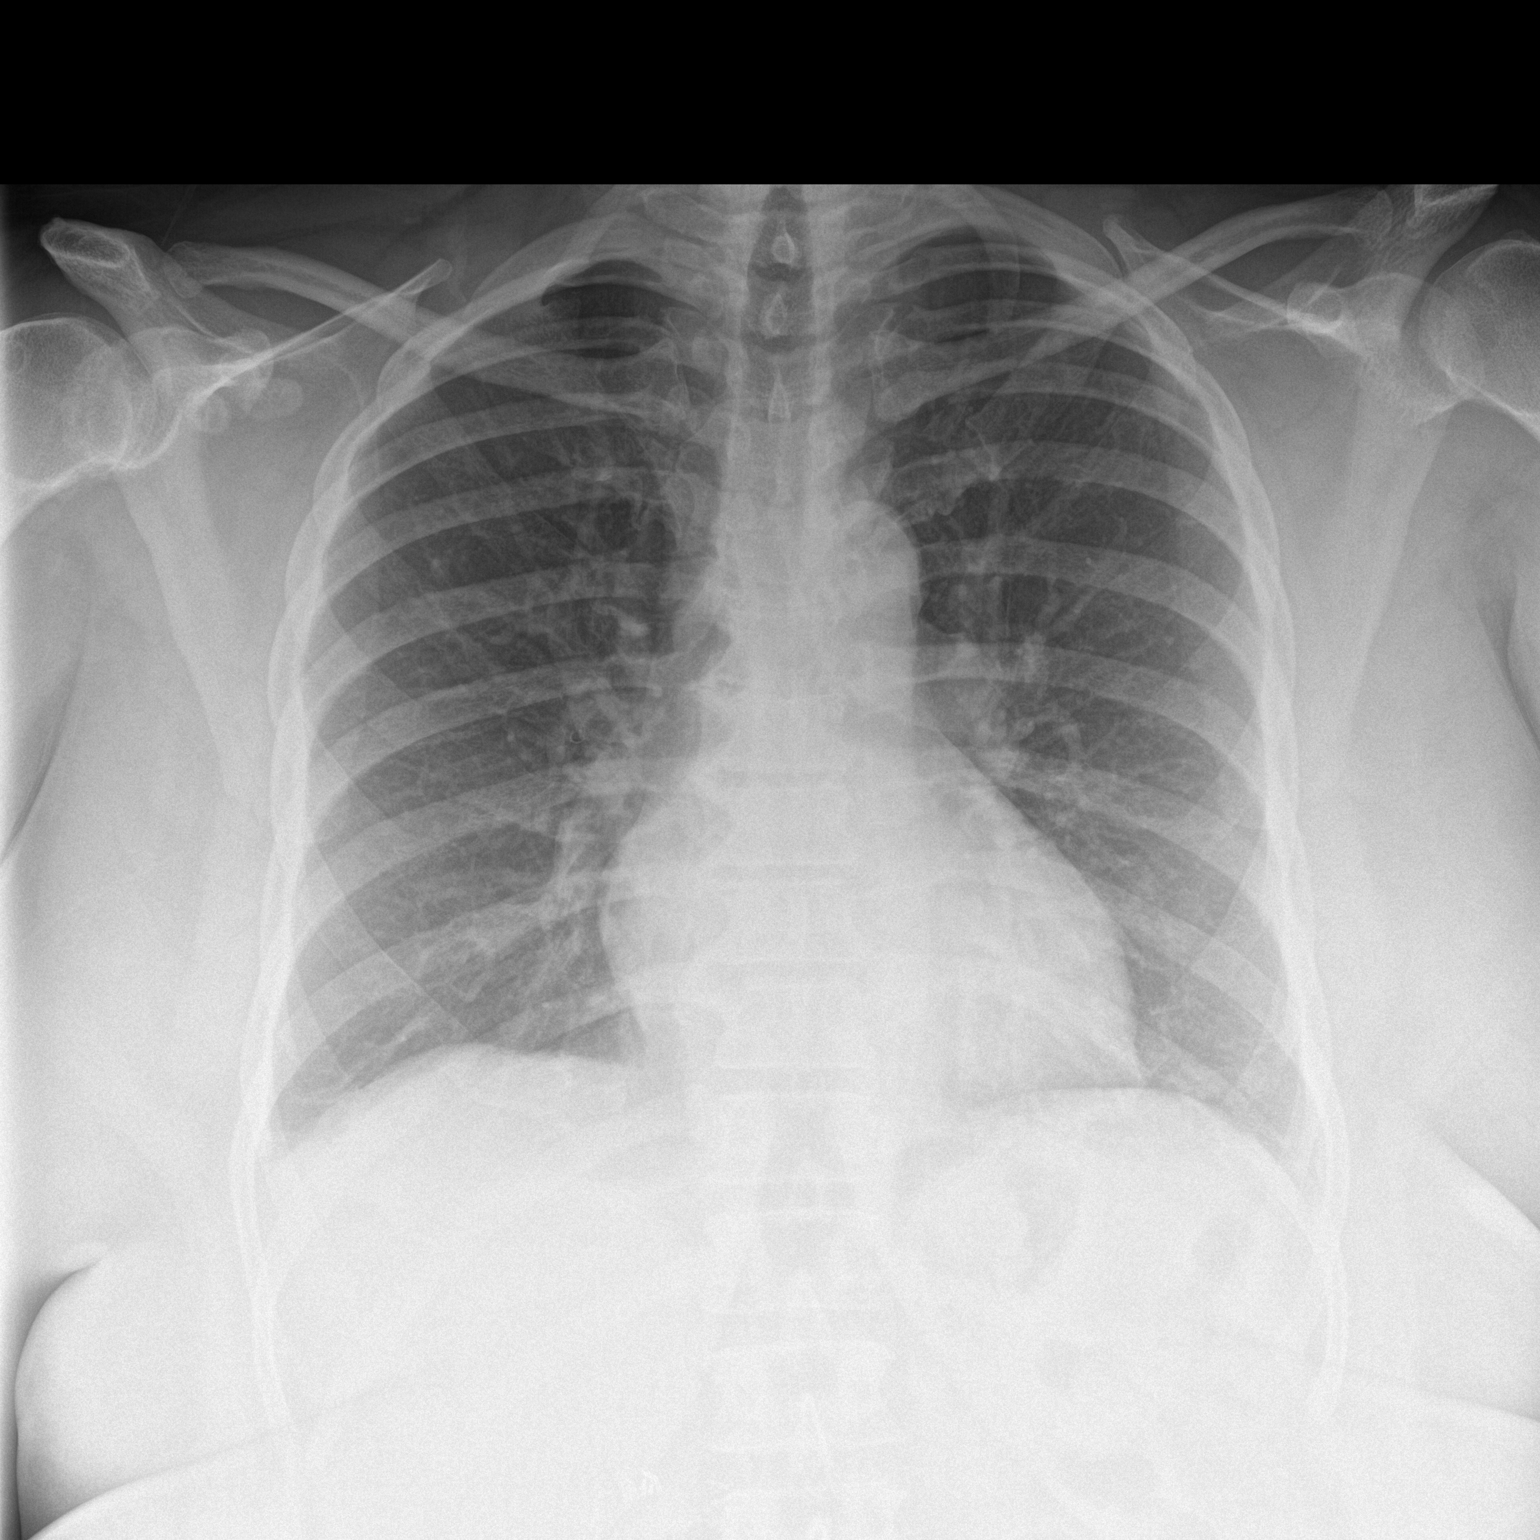

[chest lat]
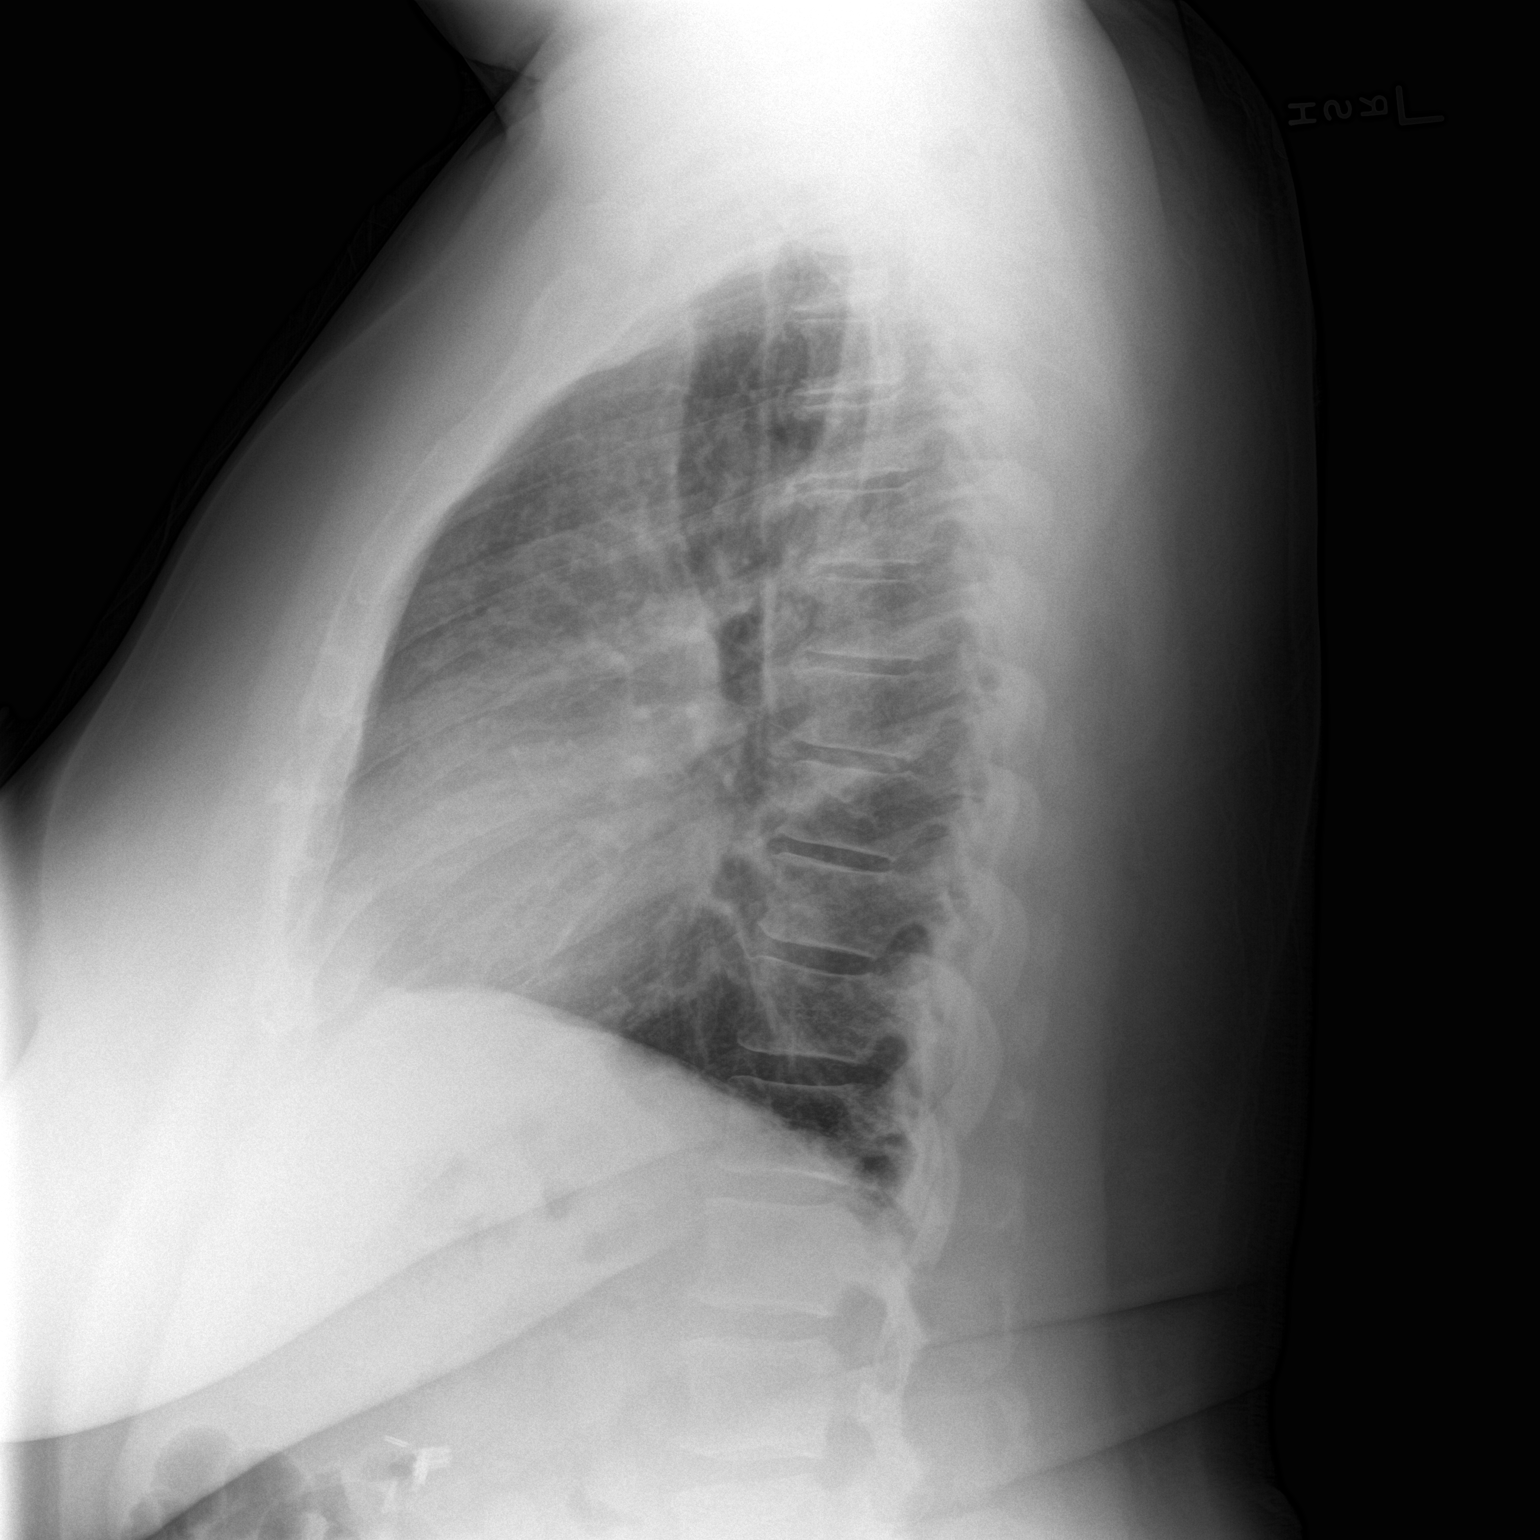

[2 of 2 positions shown; findings below may reference images not displayed]

FINDINGS: The cardiomediastinal silhouette is within normal limits. The lungs
are well inflated and clear. There is no evidence of pleural
effusion or pneumothorax. No acute osseous abnormality is
identified. Right upper quadrant abdominal surgical clips are noted.
IMPRESSION: No active cardiopulmonary disease.

## 2016-05-21 ENCOUNTER — Ambulatory Visit (INDEPENDENT_AMBULATORY_CARE_PROVIDER_SITE_OTHER): Payer: Medicaid Other | Admitting: Physician Assistant

## 2016-05-21 VITALS — BP 104/60 | HR 63 | Temp 98.5°F | Resp 16 | Ht 65.0 in | Wt 257.0 lb

## 2016-05-21 DIAGNOSIS — Z23 Encounter for immunization: Secondary | ICD-10-CM | POA: Diagnosis not present

## 2016-05-21 DIAGNOSIS — Z0289 Encounter for other administrative examinations: Secondary | ICD-10-CM | POA: Diagnosis not present

## 2016-05-21 DIAGNOSIS — Z0000 Encounter for general adult medical examination without abnormal findings: Secondary | ICD-10-CM | POA: Diagnosis not present

## 2016-05-21 DIAGNOSIS — R87610 Atypical squamous cells of undetermined significance on cytologic smear of cervix (ASC-US): Secondary | ICD-10-CM | POA: Diagnosis not present

## 2016-05-21 DIAGNOSIS — R87810 Cervical high risk human papillomavirus (HPV) DNA test positive: Secondary | ICD-10-CM

## 2016-05-21 DIAGNOSIS — Z111 Encounter for screening for respiratory tuberculosis: Secondary | ICD-10-CM | POA: Diagnosis not present

## 2016-05-21 DIAGNOSIS — Z124 Encounter for screening for malignant neoplasm of cervix: Secondary | ICD-10-CM

## 2016-05-21 DIAGNOSIS — Z1211 Encounter for screening for malignant neoplasm of colon: Secondary | ICD-10-CM

## 2016-05-21 DIAGNOSIS — Z1329 Encounter for screening for other suspected endocrine disorder: Secondary | ICD-10-CM

## 2016-05-21 DIAGNOSIS — Z1389 Encounter for screening for other disorder: Secondary | ICD-10-CM

## 2016-05-21 DIAGNOSIS — Z13220 Encounter for screening for lipoid disorders: Secondary | ICD-10-CM

## 2016-05-21 NOTE — Patient Instructions (Signed)
     IF you received an x-ray today, you will receive an invoice from [facility]. Please contact [facility] at [phone] with questions or concerns regarding your invoice.   IF you received labwork today, you will receive an invoice from [facility]. Please contact [facility] at [phone] with questions or concerns regarding your invoice.   Our billing staff will not be able to assist you with questions regarding bills from these companies.  You will be contacted with the lab results as soon as they are available. The fastest way to get your results is to activate your My Chart account. Instructions are located on the last page of this paperwork. If you have not heard from us regarding the results in 2 weeks, please contact this office.     

## 2016-05-21 NOTE — Consult Note (Signed)
  Tuberculosis Risk Questionnaire  1. No Were you born outside the [place] in one of the following parts of the world: [place], [place], [place], [place] America or [place]?    2. No Have you traveled outside the [place] and lived for more than one month in one of the following parts of the world: [place], [place], [place], [place] or [place]?    3. Yes  Do you have a compromised immune system such as from any of the following conditions:HIV/AIDS, organ or bone marrow transplantation, diabetes, immunosuppressive medicines (e.g. Prednisone, Remicaide), leukemia, lymphoma, cancer of the head or neck, gastrectomy or jejunal bypass, end-stage renal disease (on dialysis), or silicosis?     4. No Have you ever or do you plan on working in: a residential care center, a health care facility, a jail or prison or homeless shelter?    5. No Have you ever: injected illegal drugs, used crack cocaine, lived in a homeless shelter  or been in jail or prison?     6. No Have you ever been exposed to anyone with infectious tuberculosis?    Tuberculosis Symptom Questionnaire  Do you currently have any of the following symptoms?  1. No Unexplained cough lasting more than 3 weeks?   2. No Unexplained fever lasting more than 3 weeks.   3. No Night Sweats (sweating that leaves the bedclothes and sheets wet)     4. No Shortness of Breath   5. No Chest Pain   6. No Unintentional weight loss    7. No Unexplained fatigue (very tired for no reason)    

## 2016-05-21 NOTE — Progress Notes (Addendum)
05/22/2016 10:33 AM   DOB: Apr 09, 1950 / MRN: [id number]  SUBJECTIVE:  Jocelyn Little is a 66 y.o. female with a history of DM2, HTN, sleep apnea, GERD presenting for an annual physical administrative physical. She had a hysterectomy in 1996 and she is unsure if she has a cervix.  She did have a pap in 2013 but the results are not avaiable in epic. She tells me she had a colonoscopy about 2-3 years ago and this was done at [facility] however this is not documented in HCL. She has a history of sleep apnea managed by neurology. She can not remember the last time she had a colonoscopy.   Mammogram up to date and normal. She goes to Dr. Gelene Minkimothy Koop for annual eye exams. She has a history of positive RPR however titer shows 1:1 and she tells me today that she did need to take medication. HIV negative. She feels no need to be screened for STI today. She has been with the same sexual partner for the last year and feels safe at home. She does not exercise formally.   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  She has some forms she needs completed today.   Depression screen PHQ 2/9 05/21/2016  Decreased Interest 0  Down, Depressed, Hopeless 0  PHQ - 2 Score 0     Immunization History  Administered Date(s) Administered  . Influenza Split 03/10/2011, 12/24/2015  . Influenza Whole 01/08/2008, 01/07/2010  . Influenza,inj,Quad PF,36+ Mos 01/22/2014, 01/23/2015, 12/05/2015  . Pneumococcal Polysaccharide-23 05/21/2016     She is allergic to citrus; dust mite extract; naproxen; and other.   She  has a past medical history of Diabetes mellitus without complication (HCC); GERD (gastroesophageal reflux disease); High cholesterol; HTN (hypertension); OSA (obstructive sleep apnea); and Rhinitis.    She  reports that she quit smoking about 18 years ago. Her smoking use included Cigarettes. She has a 16.00 pack-year smoking history. She has never used smokeless tobacco. She reports that she does not  drink alcohol or use drugs. She  reports that she currently engages in sexual activity. She reports using the following method of birth control/protection: Surgical. The patient  has a past surgical history that includes Uvulopalatopharyngoplasty; Cholecystectomy; Total abdominal hysterectomy; and Lipoma excision.  Her family history includes Asthma in her brother; Diabetes in her brother; Emphysema in her father; Heart disease in her brother and mother; Lung cancer in her father; Rheum arthritis in her brother and father.  Review of Systems  Constitutional: Negative for chills and fever.  HENT: Negative for congestion and sore throat.   Respiratory: Negative for cough and shortness of breath.   Cardiovascular: Negative for chest pain, palpitations, orthopnea and leg swelling.  Gastrointestinal: Negative for abdominal pain, nausea and vomiting.  Genitourinary: Negative for dysuria, frequency and urgency.  Musculoskeletal: Negative for back pain, myalgias and neck pain.  Skin: Negative for itching and rash.  Neurological: Negative for dizziness.  Psychiatric/Behavioral: Negative for depression, hallucinations, memory loss, substance abuse and suicidal ideas. The patient is not nervous/anxious and does not have insomnia.     The problem list and medications were reviewed and updated by myself where necessary and exist elsewhere in the encounter.   OBJECTIVE:  BP 104/60   Pulse 63   Temp 98.5 F (36.9 C) (Oral)   Resp 16   Ht 5\' 5"  (1.651 m)   Wt 257 lb (116.6 kg)   SpO2 98%   BMI 42.77 kg/m  Physical Exam  Constitutional: She is oriented to person, place, and time. She appears well-nourished. No distress.  HENT:  Right Ear: External ear normal.  Left Ear: External ear normal.  Nose: Nose normal.  Mouth/Throat: No oropharyngeal exudate.  Eyes: Conjunctivae and EOM are normal. Pupils are equal, round, and reactive to light.  Fundoscopic exam:      The right eye shows no exudate  and no hemorrhage. The right eye shows red reflex.       The left eye shows no exudate and no hemorrhage. The left eye shows red reflex.  Cardiovascular: Normal rate and regular rhythm.   Pulmonary/Chest: Effort normal and breath sounds normal.  Abdominal: She exhibits no distension.  Genitourinary: Vagina normal. There is no rash, tenderness, lesion or injury on the right labia. There is no rash, tenderness, lesion or injury on the left labia. Cervix exhibits no motion tenderness, no discharge and no friability. Right adnexum displays no mass. No tenderness in the vagina.  Musculoskeletal: Normal range of motion. She exhibits no edema, tenderness or deformity.  Lymphadenopathy:       Right: No inguinal adenopathy present.       Left: No inguinal adenopathy present.  Neurological: She is alert and oriented to person, place, and time. She has normal reflexes. She displays normal reflexes. No cranial nerve deficit. She exhibits normal muscle tone. Coordination and gait normal.  Skin: Skin is warm and dry. She is not diaphoretic.  Psychiatric: She has a normal mood and affect. Her behavior is normal. Judgment and thought content normal. Her mood appears not anxious. Her affect is not angry, not blunt, not labile and not inappropriate. Her speech is not rapid and/or pressured and not delayed. Cognition and memory are normal. She does not exhibit a depressed mood.  Vitals reviewed.   Recent Results (from the past 2160 hour(s))  POCT glycosylated hemoglobin (Hb A1C)     Status: Normal   Collection Time: 02/25/16  1:33 PM  Result Value Ref Range   Hemoglobin A1C 6.3   TSH     Status: None   Collection Time: 05/21/16 11:35 AM  Result Value Ref Range   TSH 1.730 0.450 - 4.500 uIU/mL  Lipid panel     Status: Abnormal   Collection Time: 05/21/16 11:35 AM  Result Value Ref Range   Cholesterol, Total 190 100 - 199 mg/dL   Triglycerides 161 0 - 149 mg/dL   HDL 57 >09 mg/dL   VLDL Cholesterol Cal 26  5 - 40 mg/dL   LDL Calculated 604 (H) 0 - 99 mg/dL   Chol/HDL Ratio 3.3 0.0 - 4.4 ratio units    Comment:                                   T. Chol/HDL Ratio                                             Men  Women                               1/2 Avg.Risk  3.4    3.3  Avg.Risk  5.0    4.4                                2X Avg.Risk  9.6    7.1                                3X Avg.Risk 23.4   11.0     ASSESSMENT AND PLAN:  Jocelyn Little was seen today for annual exam and immunizations.  Diagnoses and all orders for this visit:  Annual physical exam: She is doing well however she has quite a few chronic medical conditions. Will start to follow her every 3-6 months.  Labs pending.   Screening-pulmonary TB -     Quantiferon tb gold assay  Screening for lipid disorders -     Lipid panel  Screening for thyroid disorder -     TSH  Need for prophylactic vaccination against Streptococcus pneumoniae (pneumococcus) -     Pneumococcal polysaccharide vaccine 23-valent greater than or equal to 2yo subcutaneous/IM  Special screening for malignant neoplasms, colon -     Ambulatory referral to Gastroenterology  Screening for nephropathy -     Microalbumin, urine  Screening for cervical cancer -     Pap IG w/ reflex to HPV when [facility] [employer])  History and physical examination, occupation: Form completed and signed 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     The patient is advised to call or return to clinic if she does not see an improvement in symptoms, or to seek the care of the closest emergency department if she worsens with the above plan.   Deliah Boston, MHS, PA-C [facility] 05/22/2016 10:33 AM    Patient with positive PAP and HPV.  Will send to gyn. Deliah Boston, MS, PA-C 9:28 AM, 05/28/2016

## 2016-05-22 ENCOUNTER — Encounter: Payer: Self-pay | Admitting: Gastroenterology

## 2016-05-22 LAB — LIPID PANEL
CHOL/HDL RATIO: 3.3 ratio (ref 0.0–4.4)
Cholesterol, Total: 190 mg/dL (ref 100–199)
HDL: 57 mg/dL (ref >39)
LDL CALC: 107 mg/dL — AB (ref 0–99)
Triglycerides: 131 mg/dL (ref 0–149)
VLDL Cholesterol Cal: 26 mg/dL (ref 5–40)

## 2016-05-22 LAB — TSH: TSH: 1.73 u[IU]/mL (ref 0.450–4.500)

## 2016-05-22 LAB — MICROALBUMIN, URINE

## 2016-05-25 ENCOUNTER — Encounter: Payer: Self-pay | Admitting: Gastroenterology

## 2016-05-26 LAB — QUANTIFERON TB GOLD ASSAY (BLOOD)

## 2016-05-26 LAB — QUANTIFERON IN TUBE
QFT TB AG MINUS NIL VALUE: 0.05 IU/mL
QUANTIFERON MITOGEN VALUE: 5.9 IU/mL
QUANTIFERON TB AG VALUE: 0.09 IU/mL
QUANTIFERON TB GOLD: NEGATIVE
Quantiferon Nil Value: 0.04 IU/mL

## 2016-05-27 LAB — PAP IG W/ RFLX HPV ASCU: PAP Smear Comment: 0

## 2016-05-27 LAB — HPV DNA PROBE HIGH RISK, AMPLIFIED: HPV, HIGH-RISK: POSITIVE — AB

## 2016-05-28 DIAGNOSIS — R87810 Cervical high risk human papillomavirus (HPV) DNA test positive: Secondary | ICD-10-CM

## 2016-05-28 DIAGNOSIS — R87610 Atypical squamous cells of undetermined significance on cytologic smear of cervix (ASC-US): Secondary | ICD-10-CM | POA: Insufficient documentation

## 2016-05-28 NOTE — Addendum Note (Signed)
Addended by: Ofilia NeasLARK, MICHAEL L on: 05/28/2016 09:28 AM   Modules accepted: Orders

## 2016-06-19 DIAGNOSIS — G4733 Obstructive sleep apnea (adult) (pediatric): Secondary | ICD-10-CM | POA: Diagnosis not present

## 2016-07-01 ENCOUNTER — Encounter: Payer: Self-pay | Admitting: Physician Assistant

## 2016-07-13 NOTE — Addendum Note (Signed)
Addended by: Ofilia NeasLARK, MICHAEL L on: 07/13/2016 03:07 PM   Modules accepted: Level of Service

## 2016-07-14 ENCOUNTER — Encounter: Payer: Self-pay | Admitting: Physician Assistant

## 2016-07-15 ENCOUNTER — Ambulatory Visit (AMBULATORY_SURGERY_CENTER): Payer: Self-pay

## 2016-07-15 VITALS — Ht 64.0 in | Wt 259.2 lb

## 2016-07-15 DIAGNOSIS — Z1211 Encounter for screening for malignant neoplasm of colon: Secondary | ICD-10-CM

## 2016-07-15 MED ORDER — SUPREP BOWEL PREP KIT 17.5-3.13-1.6 GM/177ML PO SOLN: 1.0000 | Freq: Once | ORAL | 0 refills | Status: AC

## 2016-07-15 NOTE — Progress Notes (Signed)
No allergies to eggs or soy No past problems with anesthesia No diet meds No home oxygen  Declined emmi 

## 2016-07-17 DIAGNOSIS — G4733 Obstructive sleep apnea (adult) (pediatric): Secondary | ICD-10-CM | POA: Diagnosis not present

## 2016-07-19 DIAGNOSIS — G4733 Obstructive sleep apnea (adult) (pediatric): Secondary | ICD-10-CM | POA: Diagnosis not present

## 2016-07-20 ENCOUNTER — Encounter: Payer: Self-pay | Admitting: Physician Assistant

## 2016-07-20 ENCOUNTER — Ambulatory Visit (INDEPENDENT_AMBULATORY_CARE_PROVIDER_SITE_OTHER): Payer: Medicare HMO | Admitting: Physician Assistant

## 2016-07-20 VITALS — BP 142/84 | HR 70 | Temp 98.3°F | Resp 18 | Ht 64.72 in | Wt 257.4 lb

## 2016-07-20 DIAGNOSIS — R6882 Decreased libido: Secondary | ICD-10-CM | POA: Diagnosis not present

## 2016-07-20 DIAGNOSIS — I10 Essential (primary) hypertension: Secondary | ICD-10-CM | POA: Diagnosis not present

## 2016-07-20 MED ORDER — BUPROPION HCL ER (XL) 150 MG PO TB24: 150.0000 mg | ORAL_TABLET | Freq: Every day | ORAL | 3 refills | Status: DC

## 2016-07-20 MED ORDER — AMLODIPINE BESYLATE 5 MG PO TABS: 5.0000 mg | ORAL_TABLET | Freq: Every day | ORAL | 3 refills | Status: DC

## 2016-07-20 NOTE — Progress Notes (Signed)
07/20/2016 1:35 PM   DOB: [birth year] / MRN: [id number]  SUBJECTIVE:  Jocelyn Little is a 66 y.o. female presenting for decreased "libido" that has been present for about 4 months and she tells me that she can not feel the stimulation that she used to which started over 1 year ago.  She tells me that this problem emerged insidiously.  She tells me that her current partner is a fair lover and she does feel attraction towards him.    She does take Trintellex and this was prescribed by Dr. Mayford KnifeWilliams and feels that it has helped her "not get bent out of shape." She has also tried Lexapro,   She has a history of well controlled HTN and takes metoprolol and norvasc for this.  She denies any problems with this medications and tells her BP is typically less than 140/90 with this regimen.   She is allergic to citrus; dust mite extract; naproxen; and other.   She  has a past medical history of Diabetes mellitus without complication (HCC); GERD (gastroesophageal reflux disease); High cholesterol; HTN (hypertension); OSA (obstructive sleep apnea); and Rhinitis.    She  reports that she quit smoking about 18 years ago. Her smoking use included Cigarettes. She has a 16.00 pack-year smoking history. She has never used smokeless tobacco. She reports that she does not drink alcohol or use drugs. She  reports that she currently engages in sexual activity. She reports using the following method of birth control/protection: Surgical. The patient  has a past surgical history that includes Uvulopalatopharyngoplasty; Cholecystectomy; Total abdominal hysterectomy; and Lipoma excision.  Her family history includes Asthma in her brother; Diabetes in her brother; Emphysema in her father; Heart disease in her brother and mother; Lung cancer in her father; Rheum arthritis in her brother and father.  Review of Systems  Constitutional: Negative for chills and fever.  Respiratory: Negative for cough.   Cardiovascular:  Negative for chest pain.  Genitourinary: Negative for dysuria, flank pain, frequency, hematuria and urgency.  Skin: Negative for itching and rash.  Neurological: Negative for dizziness.  Psychiatric/Behavioral: Negative for depression and substance abuse. The patient is not nervous/anxious and does not have insomnia.     The problem list and medications were reviewed and updated by myself where necessary and exist elsewhere in the encounter.   OBJECTIVE:  BP (!) 142/84   Pulse 70   Temp 98.3 F (36.8 C) (Oral)   Resp 18   Ht 5' 4.72" (1.644 m)   Wt 257 lb 6.4 oz (116.8 kg)   SpO2 96%   BMI 43.20 kg/m   Physical Exam  Constitutional: She is active.  Non-toxic appearance.  HENT:  Right Ear: Hearing, tympanic membrane, external ear and ear canal normal.  Left Ear: Hearing, tympanic membrane, external ear and ear canal normal.  Nose: Nose normal. Right sinus exhibits no maxillary sinus tenderness and no frontal sinus tenderness. Left sinus exhibits no maxillary sinus tenderness and no frontal sinus tenderness.  Mouth/Throat: Uvula is midline, oropharynx is clear and moist and mucous membranes are normal. Mucous membranes are not dry. No oropharyngeal exudate, posterior oropharyngeal edema or tonsillar abscesses.  Cardiovascular: Normal rate.   Pulmonary/Chest: Effort normal. No tachypnea.  Lymphadenopathy:       Head (right side): No submandibular and no tonsillar adenopathy present.       Head (left side): No submandibular and no tonsillar adenopathy present.    She has no cervical adenopathy.  Neurological: She is alert.  Skin:  Skin is warm and dry. She is not diaphoretic. No pallor.   Lab Results  Component Value Date   HGBA1C 6.3 02/25/2016   BP Readings from Last 3 Encounters:  07/20/16 (!) 142/84  05/21/16 104/60  04/27/16 128/76    No results found for this or any previous visit (from the past 72 hour(s)).  No results found.  ASSESSMENT AND PLAN:  Arnie  was seen today for follow-up and medication refill.  Diagnoses and all orders for this visit:  Decreased libido: Likely related to SSRI administration.  Starting Wellbutrin.  She will send me an email in about 3-4 weeks regarding improvement.  If there is improvement will consider increasing dose if she desires.  If no change this may be hormonally driven and will advise she discuss this with her GYN.  -     buPROPion (WELLBUTRIN XL) 150 MG 24 hr tablet; Take 1 tablet (150 mg total) by mouth daily. -     Care order/instruction:  Essential hypertension: Well controlled. Continue current plan.  -     amLODipine (NORVASC) 5 MG tablet; Take 1 tablet (5 mg total) by mouth daily.    The patient is advised to call or return to clinic if she does not see an improvement in symptoms, or to seek the care of the closest emergency department if she worsens with the above plan.   Deliah Boston, MHS, PA-C [facility] 07/20/2016 1:35 PM

## 2016-07-20 NOTE — Patient Instructions (Addendum)
  Lets try the wellbutrin for about 1 month before making any decisions.  I think it will work nicely for your, particularly if the problem is medication related.    IF you received an x-ray today, you will receive an invoice from [facility]. Please contact [facility] at [phone] with questions or concerns regarding your invoice.   IF you received labwork today, you will receive an invoice from [facility]. Please contact [facility] at [phone] with questions or concerns regarding your invoice.   Our billing staff will not be able to assist you with questions regarding bills from these companies.  You will be contacted with the lab results as soon as they are available. The fastest way to get your results is to activate your My Chart account. Instructions are located on the last page of this paperwork. If you have not heard from us regarding the results in 2 weeks, please contact this office.

## 2016-07-28 ENCOUNTER — Telehealth: Payer: Self-pay | Admitting: Gastroenterology

## 2016-07-28 NOTE — Telephone Encounter (Signed)
Pt states she drank her first prep bottle at 420 pm and then noticed on her instructions it said start at 6 pm. Pt concerned-  Instructed her she is fine- make sure she finishes her two 16 oz cups of water and then she can do clear liquids until bed. Instructed to drink the 2nd suprep at 430 am and finish all liquids by 630 am tomorrow morning. Pt verbalized understanding of instructions Elizebeth BrookingMarie PV

## 2016-07-29 ENCOUNTER — Encounter: Payer: Self-pay | Admitting: Gastroenterology

## 2016-07-29 ENCOUNTER — Ambulatory Visit (AMBULATORY_SURGERY_CENTER): Payer: Medicare HMO | Admitting: Gastroenterology

## 2016-07-29 VITALS — BP 115/61 | HR 51 | Temp 98.4°F | Resp 13 | Ht 64.0 in | Wt 259.0 lb

## 2016-07-29 DIAGNOSIS — I10 Essential (primary) hypertension: Secondary | ICD-10-CM | POA: Diagnosis not present

## 2016-07-29 DIAGNOSIS — Z1211 Encounter for screening for malignant neoplasm of colon: Secondary | ICD-10-CM

## 2016-07-29 DIAGNOSIS — K219 Gastro-esophageal reflux disease without esophagitis: Secondary | ICD-10-CM | POA: Diagnosis not present

## 2016-07-29 DIAGNOSIS — J45909 Unspecified asthma, uncomplicated: Secondary | ICD-10-CM | POA: Diagnosis not present

## 2016-07-29 DIAGNOSIS — Z1212 Encounter for screening for malignant neoplasm of rectum: Secondary | ICD-10-CM | POA: Diagnosis not present

## 2016-07-29 DIAGNOSIS — E119 Type 2 diabetes mellitus without complications: Secondary | ICD-10-CM | POA: Diagnosis not present

## 2016-07-29 DIAGNOSIS — G4733 Obstructive sleep apnea (adult) (pediatric): Secondary | ICD-10-CM | POA: Diagnosis not present

## 2016-07-29 MED ORDER — SODIUM CHLORIDE 0.9 % IV SOLN: 500.0000 mL | INTRAVENOUS | Status: AC

## 2016-07-29 NOTE — Patient Instructions (Signed)
YOU HAD AN ENDOSCOPIC PROCEDURE TODAY AT THE [facility]:   Refer to the procedure report that was given to you for any specific questions about what was found during the examination.  If the procedure report does not answer your questions, please call your gastroenterologist to clarify.  If you requested that your care partner not be given the details of your procedure findings, then the procedure report has been included in a sealed envelope for you to review at your convenience later.  YOU SHOULD EXPECT: Some feelings of bloating in the abdomen. Passage of more gas than usual.  Walking can help get rid of the air that was put into your GI tract during the procedure and reduce the bloating. If you had a lower endoscopy (such as a colonoscopy or flexible sigmoidoscopy) you may notice spotting of blood in your stool or on the toilet paper. If you underwent a bowel prep for your procedure, you may not have a normal bowel movement for a few days.  Please Note:  You might notice some irritation and congestion in your nose or some drainage.  This is from the oxygen used during your procedure.  There is no need for concern and it should clear up in a day or so.  SYMPTOMS TO REPORT IMMEDIATELY:   Following lower endoscopy (colonoscopy or flexible sigmoidoscopy):  Excessive amounts of blood in the stool  Significant tenderness or worsening of abdominal pains  Swelling of the abdomen that is new, acute  Fever of 100F or higher  For urgent or emergent issues, a gastroenterologist can be reached at any hour by calling [phone].   DIET:  We do recommend a small meal at first, but then you may proceed to your regular diet.  Drink plenty of fluids but you should avoid alcoholic beverages for 24 hours.  ACTIVITY:  You should plan to take it easy for the rest of today and you should NOT DRIVE or use heavy machinery until tomorrow (because of the sedation medicines used during the test).     FOLLOW UP: Our staff will call the number listed on your records the next business day following your procedure to check on you and address any questions or concerns that you may have regarding the information given to you following your procedure. If we do not reach you, we will leave a message.  However, if you are feeling well and you are not experiencing any problems, there is no need to return our call.  We will assume that you have returned to your regular daily activities without incident.  If any biopsies were taken you will be contacted by phone or by letter within the next 1-3 weeks.  Please call us at [phone] if you have not heard about the biopsies in 3 weeks.    SIGNATURES/CONFIDENTIALITY: You and/or your care partner have signed paperwork which will be entered into your electronic medical record.  These signatures attest to the fact that that the information above on your After Visit Summary has been reviewed and is understood.  Full responsibility of the confidentiality of this discharge information lies with you and/or your care-partner. 

## 2016-07-29 NOTE — Op Note (Signed)
[facility] Patient Name: Jocelyn Little Procedure Date: 07/29/2016 9:12 AM MRN: [id number] Endoscopist: Rachael Fee , MD Age: 66 Referring MD:  Date of Birth: 1951-02-24 Gender: Female Account #: [id number] Procedure:                Colonoscopy Indications:              Screening for colorectal malignant neoplasm Medicines:                Monitored Anesthesia Care Procedure:                Pre-Anesthesia Assessment:                           - Prior to the procedure, a History and Physical                            was performed, and patient medications and                            allergies were reviewed. The patient's tolerance of                            previous anesthesia was also reviewed. The risks                            and benefits of the procedure and the sedation                            options and risks were discussed with the patient.                            All questions were answered, and informed consent                            was obtained. Prior Anticoagulants: The patient has                            taken no previous anticoagulant or antiplatelet                            agents. ASA Grade Assessment: III - A patient with                            severe systemic disease. After reviewing the risks                            and benefits, the patient was deemed in                            satisfactory condition to undergo the procedure.                           After obtaining informed consent, the colonoscope  was passed under direct vision. Throughout the                            procedure, the patient's blood pressure, pulse, and                            oxygen saturations were monitored continuously. The                            Model CF-HQ190L [id number](SN#[id number]) scope was introduced                            through the anus and advanced to the the cecum,   identified by appendiceal orifice and ileocecal                            valve. The colonoscopy was performed without                            difficulty. The patient tolerated the procedure                            well. The quality of the bowel preparation was                            excellent. The ileocecal valve, appendiceal                            orifice, and rectum were photographed. Scope In: 9:15:48 AM Scope Out: 9:24:51 AM Scope Withdrawal Time: 0 hours 5 minutes 42 seconds  Total Procedure Duration: 0 hours 9 minutes 3 seconds  Findings:                 External and internal hemorrhoids were found. The                            hemorrhoids were small.                           The exam was otherwise without abnormality on                            direct and retroflexion views. Complications:            No immediate complications. Estimated blood loss:                            None. Estimated Blood Loss:     Estimated blood loss: none. Impression:               - External and internal hemorrhoids.                           - The examination was otherwise normal on direct  and retroflexion views.                           - No specimens collected. Recommendation:           - Patient has a contact number available for                            emergencies. The signs and symptoms of potential                            delayed complications were discussed with the                            patient. Return to normal activities tomorrow.                            Written discharge instructions were provided to the                            patient.                           - Resume previous diet.                           - Continue present medications.                           - Repeat colonoscopy in 10 years for screening                            purposes. Rachael Fee, MD 07/29/2016 9:26:40 AM This report has been signed  electronically.

## 2016-07-29 NOTE — Progress Notes (Signed)
Report to PACU, RN, vss, BBS= Clear.  

## 2016-07-29 NOTE — Progress Notes (Signed)
Pt's states no medical or surgical changes since previsit or office visit. 

## 2016-07-30 ENCOUNTER — Telehealth: Payer: Self-pay

## 2016-07-30 ENCOUNTER — Telehealth: Payer: Self-pay | Admitting: *Deleted

## 2016-07-30 NOTE — Telephone Encounter (Signed)
  Follow up Call-  Call back number 07/29/2016  Post procedure Call Back phone  # [phone]  Permission to leave phone message Yes  Some recent data might be hidden     Patient questions:  Do you have a fever, pain , or abdominal swelling? No. Pain Score  0 *  Have you tolerated food without any problems? Yes.    Have you been able to return to your normal activities? Yes.    Do you have any questions about your discharge instructions: Diet   No. Medications  No. Follow up visit  No.  Do you have questions or concerns about your Care? No.  Actions: * If pain score is 4 or above: No action needed, pain <4.

## 2016-07-30 NOTE — Telephone Encounter (Signed)
Left message on f/u call 

## 2016-08-11 DIAGNOSIS — E119 Type 2 diabetes mellitus without complications: Secondary | ICD-10-CM | POA: Diagnosis not present

## 2016-08-11 DIAGNOSIS — H04123 Dry eye syndrome of bilateral lacrimal glands: Secondary | ICD-10-CM | POA: Diagnosis not present

## 2016-08-11 DIAGNOSIS — H25013 Cortical age-related cataract, bilateral: Secondary | ICD-10-CM | POA: Diagnosis not present

## 2016-08-11 LAB — HM DIABETES EYE EXAM

## 2016-08-19 DIAGNOSIS — G4733 Obstructive sleep apnea (adult) (pediatric): Secondary | ICD-10-CM | POA: Diagnosis not present

## 2016-08-22 ENCOUNTER — Other Ambulatory Visit: Payer: Self-pay | Admitting: Physician Assistant

## 2016-09-18 DIAGNOSIS — G4733 Obstructive sleep apnea (adult) (pediatric): Secondary | ICD-10-CM | POA: Diagnosis not present

## 2016-09-28 ENCOUNTER — Telehealth: Payer: Self-pay | Admitting: Physician Assistant

## 2016-09-28 NOTE — Telephone Encounter (Signed)
Patient needs her amlodipine, bupropion, nexium, lovastatin, metformin, metoprolol tartrate, trintellix, also she needs an accucheck nano reader and the test strips to go with this.  [facility]

## 2016-10-01 ENCOUNTER — Other Ambulatory Visit: Payer: Self-pay | Admitting: Emergency Medicine

## 2016-10-01 DIAGNOSIS — I10 Essential (primary) hypertension: Secondary | ICD-10-CM

## 2016-10-01 DIAGNOSIS — R6882 Decreased libido: Secondary | ICD-10-CM

## 2016-10-01 MED ORDER — BLOOD GLUCOSE MONITOR KIT: PACK | 0 refills | Status: AC

## 2016-10-01 MED ORDER — AMLODIPINE BESYLATE 5 MG PO TABS: 5.0000 mg | ORAL_TABLET | Freq: Every day | ORAL | 3 refills | Status: DC

## 2016-10-01 MED ORDER — METFORMIN HCL 500 MG PO TABS: 500.0000 mg | ORAL_TABLET | Freq: Every day | ORAL | 3 refills | Status: DC

## 2016-10-01 MED ORDER — BUPROPION HCL ER (XL) 150 MG PO TB24: 150.0000 mg | ORAL_TABLET | Freq: Every day | ORAL | 3 refills | Status: DC

## 2016-10-01 MED ORDER — ESOMEPRAZOLE MAGNESIUM 40 MG PO CPDR: 40.0000 mg | DELAYED_RELEASE_CAPSULE | ORAL | 0 refills | Status: DC | PRN

## 2016-10-09 ENCOUNTER — Other Ambulatory Visit: Payer: Self-pay | Admitting: Physician Assistant

## 2016-10-19 DIAGNOSIS — G4733 Obstructive sleep apnea (adult) (pediatric): Secondary | ICD-10-CM | POA: Diagnosis not present

## 2016-10-21 ENCOUNTER — Other Ambulatory Visit: Payer: Self-pay | Admitting: Family Medicine

## 2016-10-23 DIAGNOSIS — G4733 Obstructive sleep apnea (adult) (pediatric): Secondary | ICD-10-CM | POA: Diagnosis not present

## 2016-10-25 ENCOUNTER — Encounter: Payer: Self-pay | Admitting: Internal Medicine

## 2016-10-26 ENCOUNTER — Encounter: Payer: Self-pay | Admitting: Internal Medicine

## 2016-10-26 ENCOUNTER — Ambulatory Visit (INDEPENDENT_AMBULATORY_CARE_PROVIDER_SITE_OTHER): Payer: Medicare HMO | Admitting: Internal Medicine

## 2016-10-26 VITALS — BP 118/64 | HR 64 | Ht 64.0 in | Wt 258.6 lb

## 2016-10-26 DIAGNOSIS — G4733 Obstructive sleep apnea (adult) (pediatric): Secondary | ICD-10-CM

## 2016-10-26 DIAGNOSIS — J4520 Mild intermittent asthma, uncomplicated: Secondary | ICD-10-CM | POA: Diagnosis not present

## 2016-10-26 MED ORDER — ALBUTEROL SULFATE HFA 108 (90 BASE) MCG/ACT IN AERS: 2.0000 | INHALATION_SPRAY | Freq: Four times a day (QID) | RESPIRATORY_TRACT | 11 refills | Status: DC | PRN

## 2016-10-26 NOTE — Progress Notes (Addendum)
HPI  F former smoker followed for OSA/ UPPP/CPAP, allergic rhinitis, complicated by HBP, GERD Office Spirometry 10/26/16-WNL-FVC 1.97/78%, FEV1 1.61/82%, ratio 0.82, FEF 25-75% 1.59/84% NPSG 11/30/05- AHI 106.6/hour, desaturation to 77%, body weight 250 pounds -------------------------------------------------------------------------.  01/23/2016-66 year old female former smoker followed for OSA/UPPP/CPAP, allergic rhinitis, asthma, complicated by HBP, GERD, DM 2 CPAP 15/Advanced pt states she gets at least 7 hours of sleep. pt has runny nose and seasonal allergies says her otc meds makes her sleepy. pt not sure if she has a cold or if it is her allergies.  Bothersome nasal drainage causes her to take the mask off many nights. Download 74%/4 hours, AHI 3.2/hour.  10/26/16- 66 year old female former smoker followed for OSA/UPPP/CPAP, allergic rhinitis, asthma, complicated by HBP, GERD, DM 2 CPAP 15/Advanced Seen by NP in February after getting new CPAP machine. 6 month follow up for OSA. Denies any breathing issues. Uses [facility] as her DME.  Weight today 258 lbs Download compliance 70% 4 hour, AHI 2.4/hour. She says CPAP is comfortable and she is definitely better off using it-sleeps better. She skips it sometimes when she has to go sit with her elderly family member. Occasional wheeze. Rescue inhaler needs refill. No significant exacerbation or acute event since last here. Office Spirometry 10/26/16-WNL-FVC 1.97/78%, FEV1 1.61/82%, ratio 0.82, FEF 25-75% 1.59/84%  ROS-see HPI    + = pos Constitutional:   No-   weight loss, night sweats, fevers, chills, fatigue, lassitude. HEENT:   No-  headaches, difficulty swallowing, tooth/dental problems, sore throat,       No- sneezing, itching, ear ache, + nasal congestion, post nasal drip,  CV:  No-   chest pain, orthopnea, PND, swelling in lower extremities, anasarca, dizziness, palpitations Resp: + shortness of breath with exertion or at rest.     productive cough,  + non-productive cough,  No- coughing up of blood.              No-   change in color of mucus.  No- wheezing.   Skin: No-   rash or lesions. GI:  No-   heartburn, indigestion, abdominal pain, nausea, vomiting, GU:  MS:  No-   joint pain or swelling.   Neuro-     nothing unusual Psych:  No- change in mood or affect. No depression or anxiety.  No memory loss.  OBJ- Physical Exam   stable baseline exam General- Alert, Oriented, Affect-appropriate, Distress- none acute, + overweight Skin- diffuse spotting- nevi or vitelligo Lymphadenopathy- none Head- atraumatic            Eyes- Gross vision intact, PERRLA, conjunctivae and secretions clear            Ears- Hearing, canals-normal            Nose- Clear, no-Septal dev,  sniffing, +mucus, polyps, erosion, perforation             Throat- Mallampati III- s/p UPPP , mucosa clear , drainage- none, tonsils-  atrophic Neck- flexible , trachea midline, no stridor , thyroid nl, carotid no bruit Chest - symmetrical excursion , unlabored           Heart/CV- RRR , no murmur , no gallop  , no rub, nl s1 s2                           - JVD- none , edema- none, stasis changes- none, varices- none  Lung- clear to P&A, wheeze- none, cough with deep breath ,  dullness-none, rub- none           Chest wall-  Abd-  Br/ Gen/ Rectal- Not done, not indicated Extrem- cyanosis- none, clubbing, none, atrophy- none, strength- nl Neuro- grossly intact to observation

## 2016-10-26 NOTE — Assessment & Plan Note (Signed)
Mild intermittent asthma pattern with occasional wheezing. She might have slight changes of COPD on more formal PFT testing but spirometry is within normal ranges. Plan-keep a rescue inhaler available.

## 2016-10-26 NOTE — Patient Instructions (Addendum)
We can continue CPAP 15, mask of choice, humidfiier, supplies, AirView  Order- office spirometry      Dx asthma  Mild intermittent  Our goal is to wear CPAP at least 4 hours per night, and preferably all night, every night, so it can protect your breathing and your heart while you sleep.  Script sent refilling albuterol rescue inhaler  Please call if you need [place].

## 2016-10-28 NOTE — Telephone Encounter (Signed)
Please call and schedule patient and appt within 1 month with Chestine Spore his PCP.

## 2016-10-29 NOTE — Telephone Encounter (Signed)
mychart message sent to pt about making an apt for more refills °

## 2016-11-06 ENCOUNTER — Other Ambulatory Visit: Payer: Self-pay | Admitting: Physician Assistant

## 2016-11-06 DIAGNOSIS — Z1231 Encounter for screening mammogram for malignant neoplasm of breast: Secondary | ICD-10-CM

## 2016-11-17 ENCOUNTER — Ambulatory Visit: Payer: Medicare HMO

## 2016-11-19 DIAGNOSIS — G4733 Obstructive sleep apnea (adult) (pediatric): Secondary | ICD-10-CM | POA: Diagnosis not present

## 2016-11-25 ENCOUNTER — Ambulatory Visit: Payer: Medicare HMO

## 2016-11-25 ENCOUNTER — Ambulatory Visit
Admission: RE | Admit: 2016-11-25 | Discharge: 2016-11-25 | Disposition: A | Discharge status: Home / Self Care | Payer: Medicare HMO | Source: Ambulatory Visit | Attending: Physician Assistant | Admitting: Physician Assistant

## 2016-11-25 DIAGNOSIS — Z1231 Encounter for screening mammogram for malignant neoplasm of breast: Secondary | ICD-10-CM

## 2016-12-19 DIAGNOSIS — G4733 Obstructive sleep apnea (adult) (pediatric): Secondary | ICD-10-CM | POA: Diagnosis not present

## 2017-01-07 ENCOUNTER — Other Ambulatory Visit: Payer: Self-pay | Admitting: Physician Assistant

## 2017-01-07 NOTE — Telephone Encounter (Signed)
Patient has an appointment 02-08-17 for DM f/u

## 2017-01-07 NOTE — Telephone Encounter (Signed)
Pt calling stating that she need a refill on metformin shes been out fore three days

## 2017-01-14 ENCOUNTER — Telehealth: Payer: Self-pay | Admitting: Physician Assistant

## 2017-01-14 MED ORDER — VORTIOXETINE HBR 20 MG PO TABS: 20.0000 mg | ORAL_TABLET | Freq: Every day | ORAL | 0 refills | Status: DC

## 2017-01-14 NOTE — Telephone Encounter (Signed)
Refills sent for the Trintellix The Tizanidine is not on the medication list for refill.   Please refill if needed. Copied from CRM [id number]. Topic: General - Other >> Jan 13, 2017  2:34 PM Raquel SarnaHayes, Teresa G wrote: Pt needs medicine refill  Trintellix - 4 left Tizanidine- Problems with back 3 days (left side under shoulder blade) took the last 2

## 2017-01-14 NOTE — Telephone Encounter (Signed)
Requesting refill on Xanaflex/Tizanidine.  Given 02/2016 by Dr. Clelia CroftShaw. Do you want her to be seen prior to sending rx?

## 2017-01-15 NOTE — Telephone Encounter (Signed)
Okay to refill. Pls ease addend sig:  This medication should not be mixed with narcotics or benzodiazepines.

## 2017-01-19 DIAGNOSIS — R87620 Atypical squamous cells of undetermined significance on cytologic smear of vagina (ASC-US): Secondary | ICD-10-CM | POA: Diagnosis not present

## 2017-01-19 DIAGNOSIS — G4733 Obstructive sleep apnea (adult) (pediatric): Secondary | ICD-10-CM | POA: Diagnosis not present

## 2017-01-19 DIAGNOSIS — R87629 Unspecified abnormal cytological findings in specimens from vagina: Secondary | ICD-10-CM | POA: Diagnosis not present

## 2017-01-22 ENCOUNTER — Ambulatory Visit: Payer: Medicare Other | Admitting: Internal Medicine

## 2017-01-27 ENCOUNTER — Encounter: Payer: Self-pay | Admitting: Urgent Care

## 2017-01-27 ENCOUNTER — Ambulatory Visit (INDEPENDENT_AMBULATORY_CARE_PROVIDER_SITE_OTHER): Payer: Medicare HMO | Admitting: Urgent Care

## 2017-01-27 VITALS — BP 136/82 | HR 81 | Temp 99.0°F | Resp 17 | Ht 64.0 in | Wt 259.0 lb

## 2017-01-27 DIAGNOSIS — R102 Pelvic and perineal pain: Secondary | ICD-10-CM | POA: Diagnosis not present

## 2017-01-27 DIAGNOSIS — R3915 Urgency of urination: Secondary | ICD-10-CM

## 2017-01-27 DIAGNOSIS — N3090 Cystitis, unspecified without hematuria: Secondary | ICD-10-CM

## 2017-01-27 DIAGNOSIS — R350 Frequency of micturition: Secondary | ICD-10-CM

## 2017-01-27 DIAGNOSIS — G4733 Obstructive sleep apnea (adult) (pediatric): Secondary | ICD-10-CM | POA: Diagnosis not present

## 2017-01-27 LAB — POCT URINALYSIS DIP (MANUAL ENTRY)
BILIRUBIN UA: NEGATIVE mg/dL
Bilirubin, UA: NEGATIVE
Glucose, UA: NEGATIVE mg/dL
NITRITE UA: NEGATIVE
PH UA: 6 (ref 5.0–8.0)
PROTEIN UA: NEGATIVE mg/dL
Spec Grav, UA: 1.025 (ref 1.010–1.025)
UROBILINOGEN UA: 0.2 U/dL

## 2017-01-27 LAB — POC MICROSCOPIC URINALYSIS (UMFC): Mucus: ABSENT

## 2017-01-27 MED ORDER — SULFAMETHOXAZOLE-TRIMETHOPRIM 800-160 MG PO TABS: 1.0000 | ORAL_TABLET | Freq: Two times a day (BID) | ORAL | 0 refills | Status: DC

## 2017-01-27 NOTE — Patient Instructions (Addendum)
Urinary Tract Infection, Adult A urinary tract infection (UTI) is an infection of any part of the urinary tract, which includes the kidneys, ureters, bladder, and urethra. These organs make, store, and get rid of urine in the body. UTI can be a bladder infection (cystitis) or kidney infection (pyelonephritis). What are the causes? This infection may be caused by fungi, viruses, or bacteria. Bacteria are the most common cause of UTIs. This condition can also be caused by repeated incomplete emptying of the bladder during urination. What increases the risk? This condition is more likely to develop if:  You ignore your need to urinate or hold urine for long periods of time.  You do not empty your bladder completely during urination.  You wipe back to front after urinating or having a bowel movement, if you are female.  You are uncircumcised, if you are female.  You are constipated.  You have a urinary catheter that stays in place (indwelling).  You have a weak defense (immune) system.  You have a medical condition that affects your bowels, kidneys, or bladder.  You have diabetes.  You take antibiotic medicines frequently or for long periods of time, and the antibiotics no longer work well against certain types of infections (antibiotic resistance).  You take medicines that irritate your urinary tract.  You are exposed to chemicals that irritate your urinary tract.  You are female. What are the signs or symptoms? Symptoms of this condition include:  Fever.  Frequent urination or passing small amounts of urine frequently.  Needing to urinate urgently.  Pain or burning with urination.  Urine that smells bad or unusual.  Cloudy urine.  Pain in the lower abdomen or back.  Trouble urinating.  Blood in the urine.  Vomiting or being less hungry than normal.  Diarrhea or abdominal pain.  Vaginal discharge, if you are female. How is this diagnosed? This condition is  diagnosed with a medical history and physical exam. You will also need to provide a urine sample to test your urine. Other tests may be done, including:  Blood tests.  Sexually transmitted disease (STD) testing. If you have had more than one UTI, a cystoscopy or imaging studies may be done to determine the cause of the infections. How is this treated? Treatment for this condition often includes a combination of two or more of the following:  Antibiotic medicine.  Other medicines to treat less common causes of UTI.  Over-the-counter medicines to treat pain.  Drinking enough water to stay hydrated. Follow these instructions at home:  Take over-the-counter and prescription medicines only as told by your health care provider.  If you were prescribed an antibiotic, take it as told by your health care provider. Do not stop taking the antibiotic even if you start to feel better.  Avoid alcohol, caffeine, tea, and carbonated beverages. They can irritate your bladder.  Drink enough fluid to keep your urine clear or pale yellow.  Keep all follow-up visits as told by your health care provider. This is important.  Make sure to:  Empty your bladder often and completely. Do not hold urine for long periods of time.  Empty your bladder before and after sex.  Wipe from front to back after a bowel movement if you are female. Use each tissue one time when you wipe. Contact a health care provider if:  You have back pain.  You have a fever.  You feel nauseous or vomit.  Your symptoms do not get better after 3   days.  Your symptoms go away and then return. Get help right away if:  You have severe back pain or lower abdominal pain.  You are vomiting and cannot keep down any medicines or water. This information is not intended to replace advice given to you by your health care provider. Make sure you discuss any questions you have with your health care provider. Document Released:  12/03/2004 Document Revised: 08/07/2015 Document Reviewed: 01/14/2015 Elsevier Interactive Patient Education  2017 Elsevier Inc.     IF you received an x-ray today, you will receive an invoice from [facility]. Please contact [facility] at [phone] with questions or concerns regarding your invoice.   IF you received labwork today, you will receive an invoice from [facility]. Please contact [facility] at [phone] with questions or concerns regarding your invoice.   Our billing staff will not be able to assist you with questions regarding bills from these companies.  You will be contacted with the lab results as soon as they are available. The fastest way to get your results is to activate your My Chart account. Instructions are located on the last page of this paperwork. If you have not heard from us regarding the results in 2 weeks, please contact this office.      

## 2017-01-27 NOTE — Progress Notes (Signed)
  MRN: [id number] DOB: 05-Feb-1951  Subjective:   Jocelyn Little is a 66 y.o. female presenting for chief complaint of urine frequency  Reports 1 week history of urinary frequency, pelvic pressure, cloudy urine, left-sided flank pain. Had low back pain initially but this is now resolved. Reports feeling a hot sensation in her body when she urinates. Denies fever, n/v, abdominal pain, hematuria, dysuria, genital rashes, vaginal discharge. Has a history of UTIs, last episode was years ago. Tries to hydrate well.  Jocelyn Little has a current medication list which includes the following prescription(s): albuterol, amlodipine, aspirin, blood glucose meter kit and supplies, bupropion, esomeprazole, lovastatin, metformin, metoprolol tartrate, multivitamin, restasis, and vortioxetine hbr, and the following Facility-Administered Medications: sodium chloride. Also is allergic to citrus; dust mite extract; naproxen; and other.  Jocelyn Little  has a past medical history of Diabetes mellitus without complication ([place]), GERD (gastroesophageal reflux disease), High cholesterol, HTN (hypertension), OSA (obstructive sleep apnea), and Rhinitis. Also  has a past surgical history that includes Uvulopalatopharyngoplasty; Cholecystectomy; Total abdominal hysterectomy; and Lipoma excision.  Objective:   Vitals: BP 136/82   Pulse 81   Temp 99 F (37.2 C) (Oral)   Resp 17   Ht _0  (1.626 m)   Wt 259 lb (117.5 kg)   SpO2 98%   BMI 44.46 kg/m   Physical Exam  Constitutional: She is oriented to person, place, and time. She appears well-developed and well-nourished.  Cardiovascular: Normal rate, regular rhythm and intact distal pulses. Exam reveals no gallop and no friction rub.  No murmur heard. Pulmonary/Chest: No respiratory distress. She has no wheezes. She has no rales.  Abdominal: Soft. Bowel sounds are normal. She exhibits no distension and no mass. There is no tenderness. There is no guarding.  No CVA  tenderness.  Neurological: She is alert and oriented to person, place, and time.   Results for orders placed or performed in visit on 01/27/17 (from the past 24 hour(s))  POCT urinalysis dipstick     Status: Abnormal   Collection Time: 01/27/17  5:29 PM  Result Value Ref Range   Color, UA yellow yellow   Clarity, UA cloudy (A) clear   Glucose, UA negative negative mg/dL   Bilirubin, UA negative negative   Ketones, POC UA negative negative mg/dL   Spec Grav, UA 1.025 1.010 - 1.025   Blood, UA trace-intact (A) negative   pH, UA 6.0 5.0 - 8.0   Protein Ur, POC negative negative mg/dL   Urobilinogen, UA 0.2 0.2 or 1.0 E.U./dL   Nitrite, UA Negative Negative   Leukocytes, UA Trace (A) Negative  POCT Microscopic Urinalysis ([facility])     Status: Abnormal   Collection Time: 01/27/17  5:47 PM  Result Value Ref Range   WBC,UR,HPF,POC Many (A) None WBC/hpf   RBC,UR,HPF,POC None None RBC/hpf   Bacteria Few (A) None, Too numerous to count   Mucus Absent Absent   Epithelial Cells, UR Per Microscopy None None, Too numerous to count cells/hpf    Assessment and Plan :   1. Cystitis 2. Urinary frequency 3. Pelvic pressure in female 4. Urinary urgency - Will have patient start her Bactrim to address cystitis. Urine culture pending. Return-to-clinic precautions discussed, patient verbalized understanding.    Jaynee Eagles, PA-C [facility] at [facility] [phone] 01/27/2017  5:17 PM

## 2017-01-28 LAB — URINE CULTURE

## 2017-02-08 ENCOUNTER — Ambulatory Visit (INDEPENDENT_AMBULATORY_CARE_PROVIDER_SITE_OTHER): Payer: Medicare HMO | Admitting: Physician Assistant

## 2017-02-08 ENCOUNTER — Other Ambulatory Visit: Payer: Self-pay

## 2017-02-08 ENCOUNTER — Encounter: Payer: Self-pay | Admitting: Physician Assistant

## 2017-02-08 VITALS — BP 128/72 | HR 71 | Resp 16 | Ht 64.0 in | Wt 257.2 lb

## 2017-02-08 DIAGNOSIS — Z6841 Body Mass Index (BMI) 40.0 and over, adult: Secondary | ICD-10-CM

## 2017-02-08 DIAGNOSIS — Z23 Encounter for immunization: Secondary | ICD-10-CM | POA: Diagnosis not present

## 2017-02-08 DIAGNOSIS — E119 Type 2 diabetes mellitus without complications: Secondary | ICD-10-CM | POA: Diagnosis not present

## 2017-02-08 DIAGNOSIS — R6882 Decreased libido: Secondary | ICD-10-CM

## 2017-02-08 LAB — POCT GLYCOSYLATED HEMOGLOBIN (HGB A1C): HEMOGLOBIN A1C: 6.4

## 2017-02-08 MED ORDER — LIRAGLUTIDE -WEIGHT MANAGEMENT 18 MG/3ML ~~LOC~~ SOPN: 0.6000 mg | PEN_INJECTOR | SUBCUTANEOUS | 3 refills | Status: AC

## 2017-02-08 MED ORDER — LOVASTATIN 20 MG PO TABS: 20.0000 mg | ORAL_TABLET | Freq: Every day | ORAL | 3 refills | Status: DC

## 2017-02-08 MED ORDER — BUPROPION HCL ER (XL) 150 MG PO TB24: 150.0000 mg | ORAL_TABLET | Freq: Every day | ORAL | 3 refills | Status: DC

## 2017-02-08 MED ORDER — METFORMIN HCL 500 MG PO TABS: 500.0000 mg | ORAL_TABLET | Freq: Every day | ORAL | 3 refills | Status: DC

## 2017-02-08 NOTE — Progress Notes (Signed)
02/08/2017 1:31 PM   DOB: September 03, 1950 / MRN: [id number]  SUBJECTIVE:  Jocelyn Little is a 66 y.o. female presenting for diabetes check and wellbutrin refills. She feels that her depression is under control with SSRI which she takes every other day and feels that the libido problem has been solved by the Wellbutrin. She has a history of well controlled diabetes.  On statin. On ASA. No micro albumin.     Depression screen PHQ 2/9 02/08/2017  Decreased Interest 0  Down, Depressed, Hopeless 0  PHQ - 2 Score 0   Immunization History  Administered Date(s) Administered  . Influenza Split 03/10/2011, 12/24/2015  . Influenza Whole 01/08/2008, 01/07/2010  . Influenza,inj,Quad PF,6+ Mos 01/22/2014, 01/23/2015, 12/05/2015, 02/08/2017  . Pneumococcal Polysaccharide-23 05/21/2016  . Tdap 02/08/2017     .  She is allergic to citrus; dust mite extract; naproxen; and other.   She  has a past medical history of Diabetes mellitus without complication (HCC), GERD (gastroesophageal reflux disease), High cholesterol, HTN (hypertension), OSA (obstructive sleep apnea), and Rhinitis.    She  reports that she quit smoking about 18 years ago. Her smoking use included cigarettes. She has a 16.00 pack-year smoking history. she has never used smokeless tobacco. She reports that she does not drink alcohol or use drugs. She  reports that she currently engages in sexual activity. She reports using the following method of birth control/protection: Surgical. The patient  has a past surgical history that includes Uvulopalatopharyngoplasty; Cholecystectomy; Total abdominal hysterectomy; and Lipoma excision.  Her family history includes Asthma in her brother; Breast cancer in her mother; Diabetes in her brother; Emphysema in her father; Heart disease in her brother and mother; Lung cancer in her father; Rheum arthritis in her brother and father.  Review of Systems  Constitutional: Negative for chills, diaphoresis and  fever.  Eyes: Negative.   Respiratory: Negative for cough, hemoptysis, sputum production, shortness of breath and wheezing.   Cardiovascular: Negative for chest pain, orthopnea and leg swelling.  Gastrointestinal: Negative for nausea.  Skin: Negative for rash.  Neurological: Negative for dizziness, sensory change, speech change, focal weakness and headaches.    The problem list and medications were reviewed and updated by myself where necessary and exist elsewhere in the encounter.   OBJECTIVE:  BP 128/72 (BP Location: Right Arm, Patient Position: Sitting, Cuff Size: Large)   Pulse 71   Resp 16   Ht 5\' 4"  (1.626 m)   Wt 257 lb 3.2 oz (116.7 kg)   SpO2 98%   BMI 44.15 kg/m   Physical Exam  Constitutional: She is active.  Non-toxic appearance.  Cardiovascular: Normal rate, regular rhythm, S1 normal, S2 normal, normal heart sounds and intact distal pulses. Exam reveals no gallop, no friction rub and no decreased pulses.  No murmur heard. Pulmonary/Chest: Effort normal. No stridor. No tachypnea. No respiratory distress. She has no wheezes. She has no rales.  Abdominal: She exhibits no distension.  Musculoskeletal: She exhibits no edema.  Neurological: She is alert.  Skin: Skin is warm and dry. She is not diaphoretic. No pallor.    Lab Results  Component Value Date   HGBA1C 6.4 02/08/2017   Lab Results  Component Value Date   CREATININE 0.95 12/05/2015   BUN 16 12/05/2015   NA 142 12/05/2015   K 4.2 12/05/2015   CL 103 12/05/2015   CO2 30 12/05/2015   Lab Results  Component Value Date   CHOL 190 05/21/2016   HDL 57 05/21/2016  LDLCALC 107 (H) 05/21/2016   TRIG 131 05/21/2016   CHOLHDL 3.3 05/21/2016   Wt Readings from Last 3 Encounters:  02/08/17 257 lb 3.2 oz (116.7 kg)  01/27/17 259 lb (117.5 kg)  10/26/16 258 lb 9.6 oz (117.3 kg)    Results for orders placed or performed in visit on 02/08/17 (from the past 72 hour(s))  POCT glycosylated hemoglobin (Hb A1C)      Status: None   Collection Time: 02/08/17 11:40 AM  Result Value Ref Range   Hemoglobin A1C 6.4     No results found.  ASSESSMENT AND PLAN:  Jocelyn Little was seen today for diabetes.  Diagnoses and all orders for this visit:  Well controlled diabetes mellitus (HCC) -     HM DIABETES FOOT EXAM -     POCT glycosylated hemoglobin (Hb A1C) -     Microalbumin, urine -     metFORMIN (GLUCOPHAGE) 500 MG tablet; Take 1 tablet (500 mg total) by mouth daily with breakfast. -     lovastatin (MEVACOR) 20 MG tablet; Take 1 tablet (20 mg total) by mouth daily at 6 PM.  Decreased libido -     buPROPion (WELLBUTRIN XL) 150 MG 24 hr tablet; Take 1 tablet (150 mg total) by mouth daily.  Class 3 severe obesity without serious comorbidity with body mass index (BMI) of 40.0 to 44.9 in adult, unspecified obesity type (HCC) -     Liraglutide -Weight Management (SAXENDA) 18 MG/3ML SOPN; Inject 0.6 mg into the skin 1 day or 1 dose for 1 dose.  Need for prophylactic vaccination and inoculation against influenza -     Tdap vaccine greater than or equal to 7yo IM -     Flu Vaccine QUAD 36+ mos IM    The patient is advised to call or return to clinic if she does not see an improvement in symptoms, or to seek the care of the closest emergency department if she worsens with the above plan.   Deliah BostonMichael Chandrika Sandles, MHS, PA-C [facility] 02/08/2017 1:31 PM

## 2017-02-09 LAB — MICROALBUMIN, URINE: Microalbumin, Urine: 3.7 ug/mL

## 2017-02-19 ENCOUNTER — Other Ambulatory Visit: Payer: Self-pay | Admitting: Physician Assistant

## 2017-02-19 ENCOUNTER — Telehealth: Payer: Self-pay | Admitting: Physician Assistant

## 2017-02-19 MED ORDER — LIRAGLUTIDE 18 MG/3ML ~~LOC~~ SOPN: PEN_INJECTOR | SUBCUTANEOUS | 0 refills | Status: DC

## 2017-02-19 NOTE — Telephone Encounter (Signed)
I have placed an order for a similar injectable and will start at a low dose.  I'd like to see her back in about 3 months for a recheck.  I'd like to see her back sooner if at any point she feels poorly. Jocelyn BostonMichael Dallas Torok, MS, PA-C 2:36 PM, 02/19/2017

## 2017-02-19 NOTE — Telephone Encounter (Signed)
Spoke with [employer] x 2 re: directions on medication Jocelyn CoveySAxenda  Return phone call states that is not covered by pt insurance.  They recommended Trulicity @[name]  to pt.  Discussed with [name].  He will re order.

## 2017-02-19 NOTE — Telephone Encounter (Signed)
Copied from CRM [id number]. Topic: Quick Communication - See Telephone Encounter >> Feb 19, 2017 12:31 PM Terisa Starraylor, Brittany L wrote: CRM for notification. See Telephone encounter for:   02/19/17.  Pt said that Dr Chestine Sporelark [facility]. She said the pharmacy needs clarification on the directions. Please call [employer] @ [phone]. She did not know the fax number. Please advise.

## 2017-02-23 NOTE — Telephone Encounter (Signed)
See previous note

## 2017-03-17 ENCOUNTER — Other Ambulatory Visit: Payer: Self-pay | Admitting: Physician Assistant

## 2017-03-18 NOTE — Telephone Encounter (Signed)
Refill req for Trintellix sent to [name], PA

## 2017-03-18 NOTE — Telephone Encounter (Signed)
Refill request for Trintellix. Last refill was 01/15/17 for 30 tablets.

## 2017-04-12 ENCOUNTER — Emergency Department (HOSPITAL_COMMUNITY)
Admission: EM | Admit: 2017-04-12 | Discharge: 2017-04-12 | Disposition: A | Discharge status: Home / Self Care | Payer: Medicare Other | Attending: Emergency Medicine | Admitting: Emergency Medicine

## 2017-04-12 ENCOUNTER — Encounter (HOSPITAL_COMMUNITY): Payer: Self-pay

## 2017-04-12 ENCOUNTER — Other Ambulatory Visit: Payer: Self-pay

## 2017-04-12 DIAGNOSIS — Z87891 Personal history of nicotine dependence: Secondary | ICD-10-CM | POA: Insufficient documentation

## 2017-04-12 DIAGNOSIS — E119 Type 2 diabetes mellitus without complications: Secondary | ICD-10-CM | POA: Diagnosis not present

## 2017-04-12 DIAGNOSIS — I10 Essential (primary) hypertension: Secondary | ICD-10-CM | POA: Diagnosis not present

## 2017-04-12 DIAGNOSIS — Z7984 Long term (current) use of oral hypoglycemic drugs: Secondary | ICD-10-CM | POA: Insufficient documentation

## 2017-04-12 DIAGNOSIS — R9431 Abnormal electrocardiogram [ECG] [EKG]: Secondary | ICD-10-CM | POA: Diagnosis not present

## 2017-04-12 DIAGNOSIS — Z7982 Long term (current) use of aspirin: Secondary | ICD-10-CM | POA: Insufficient documentation

## 2017-04-12 DIAGNOSIS — J4520 Mild intermittent asthma, uncomplicated: Secondary | ICD-10-CM | POA: Insufficient documentation

## 2017-04-12 DIAGNOSIS — R519 Headache, unspecified: Secondary | ICD-10-CM

## 2017-04-12 DIAGNOSIS — R51 Headache: Secondary | ICD-10-CM | POA: Diagnosis not present

## 2017-04-12 DIAGNOSIS — Z79899 Other long term (current) drug therapy: Secondary | ICD-10-CM | POA: Diagnosis not present

## 2017-04-12 LAB — BASIC METABOLIC PANEL
Anion gap: 11 (ref 5–15)
BUN: 11 mg/dL (ref 6–20)
CALCIUM: 9 mg/dL (ref 8.9–10.3)
CO2: 26 mmol/L (ref 22–32)
CREATININE: 1 mg/dL (ref 0.44–1.00)
Chloride: 102 mmol/L (ref 101–111)
GFR calc non Af Amer: 58 mL/min — ABNORMAL LOW (ref >60)
GLUCOSE: 90 mg/dL (ref 65–99)
Potassium: 4.6 mmol/L (ref 3.5–5.1)
Sodium: 139 mmol/L (ref 135–145)

## 2017-04-12 LAB — CBC
HCT: 38 % (ref 36.0–46.0)
Hemoglobin: 12.1 g/dL (ref 12.0–15.0)
MCH: 29.3 pg (ref 26.0–34.0)
MCHC: 31.8 g/dL (ref 30.0–36.0)
MCV: 92 fL (ref 78.0–100.0)
Platelets: 235 10*3/uL (ref 150–400)
RBC: 4.13 MIL/uL (ref 3.87–5.11)
RDW: 13.8 % (ref 11.5–15.5)
WBC: 5.3 10*3/uL (ref 4.0–10.5)

## 2017-04-12 MED ORDER — DICLOFENAC EPOLAMINE 1.3 % TD PTCH: 1.0000 | MEDICATED_PATCH | Freq: Two times a day (BID) | TRANSDERMAL | 1 refills | Status: DC

## 2017-04-12 MED ORDER — TIZANIDINE HCL 4 MG PO TABS
2.0000 mg | ORAL_TABLET | Freq: Once | ORAL | Status: AC
  Administered 2017-04-12: 2 mg via ORAL
  Filled 2017-04-12: qty 1

## 2017-04-12 MED ORDER — ACETAMINOPHEN 500 MG PO TABS
1000.0000 mg | ORAL_TABLET | Freq: Once | ORAL | Status: AC
  Administered 2017-04-12: 1000 mg via ORAL
  Filled 2017-04-12: qty 2

## 2017-04-12 MED ORDER — DICLOFENAC EPOLAMINE 1.3 % TD PTCH
1.0000 | MEDICATED_PATCH | Freq: Two times a day (BID) | TRANSDERMAL | Status: DC
Start: 2017-04-12 — End: 2017-04-13
  Filled 2017-04-12: qty 1

## 2017-04-12 NOTE — ED Notes (Signed)
Patient given discharge instructions and verbalized understanding.  Patient stable to discharge at this time.  Patient is alert and oriented to baseline.  No distressed noted at this time.  All belongings taken with the patient at discharge.   

## 2017-04-12 NOTE — ED Triage Notes (Addendum)
Per Pt, Pt is coming from home with posterior head pain that started on Friday with bilateral ear pain. Pt reported some generalized weakness along with some nausea and blurred vision that started yesterday. Denies any chest pain or SOB, vomiting, or diarrhea.

## 2017-04-12 NOTE — ED Provider Notes (Signed)
[facility] EMERGENCY DEPARTMENT Provider Note   CSN: [id number] Arrival date & time: 04/12/17  1151     History   Chief Complaint Chief Complaint  Patient presents with  . Headache    HPI Jocelyn Little is a 67 y.o. female.  HPI  Patient presents due to concern of headache, generalized discomfort. Onset was 3 days ago, since onset symptoms been persistent, with pain focally about the right paraspinal neck, and right occiput. The pain radiates anteriorly on the right side Pain is described as sore, tight, persistent in spite of using OTC medication, which does diminish symptoms somewhat, but transiently. No new vision changes, confusion, disorientation, vomiting, fever, chills, or other new complaints per Patient does have headaches sometimes, but not frequently. She denies head trauma, or other accidents, or other changes in medication.  Past Medical History:  Diagnosis Date  . Diabetes mellitus without complication ([place])   . GERD (gastroesophageal reflux disease)   . High cholesterol   . HTN (hypertension)   . OSA (obstructive sleep apnea)   . Rhinitis     Patient Active Problem List   Diagnosis Date Noted  . ASCUS with positive high risk HPV cervical 05/28/2016  . Diabetes mellitus without complication ([place]) 58/59/2924  . Asthma, mild intermittent, well-controlled 01/23/2015  . Meralgia paresthetica of right side 01/29/2014  . Essential hypertension 05/16/2009  . G E R D 05/16/2009  . Obstructive sleep apnea 03/12/2009  . Seasonal and perennial allergic rhinitis 03/12/2009    Past Surgical History:  Procedure Laterality Date  . CHOLECYSTECTOMY    . LIPOMA EXCISION    . TOTAL ABDOMINAL HYSTERECTOMY    . UVULOPALATOPHARYNGOPLASTY      OB History    No data available       Home Medications    Prior to Admission medications   Medication Sig Start Date End Date Taking? Authorizing Provider  albuterol (PROVENTIL HFA;VENTOLIN HFA)  108 (90 Base) MCG/ACT inhaler Inhale 2 puffs into the lungs every 6 (six) hours as needed for wheezing. 10/26/16   Deneise Lever, MD  amLODipine (NORVASC) 5 MG tablet Take 1 tablet (5 mg total) by mouth daily. 10/01/16   Tereasa Coop, PA-C  aspirin 81 MG tablet Take 81 mg by mouth daily.    [provider]  blood glucose meter kit and supplies KIT Dispense based on patient and insurance preference. Use to check cbgs qd - fasting in the morning or 2 hours after a meal, E11.9 10/01/16   Tereasa Coop, PA-C  buPROPion (WELLBUTRIN XL) 150 MG 24 hr tablet Take 1 tablet (150 mg total) by mouth daily. 02/08/17   Tereasa Coop, PA-C  esomeprazole (NEXIUM) 40 MG capsule Take 1 capsule (40 mg total) by mouth as needed (for indigestion). 10/01/16   Tereasa Coop, PA-C  liraglutide 18 MG/3ML SOPN Start 0.6 mg subcutaneous daily for 90 days. 02/19/17   Tereasa Coop, PA-C  lovastatin (MEVACOR) 20 MG tablet Take 1 tablet (20 mg total) by mouth daily at 6 PM. 02/08/17   Tereasa Coop, PA-C  metFORMIN (GLUCOPHAGE) 500 MG tablet Take 1 tablet (500 mg total) by mouth daily with breakfast. 02/08/17   Tereasa Coop, PA-C  metoprolol tartrate (LOPRESSOR) 50 MG tablet take 1 tablet by mouth once daily 08/24/16   Tereasa Coop, PA-C  Multiple Vitamin (MULTIVITAMIN) tablet Take 1 tablet by mouth every other day.     [provider]  RESTASIS 0.05 %  ophthalmic emulsion Place 2 drops into both eyes 2 (two) times daily. 11/14/13   [provider]  vortioxetine HBr (TRINTELLIX) 20 MG TABS Take 20 mg by mouth daily. 03/18/17   Tereasa Coop, PA-C    Family History Family History  Problem Relation Age of Onset  . Emphysema Father   . Rheum arthritis Father   . Lung cancer Father   . Heart disease Mother   . Breast cancer Mother   . Asthma Brother   . Heart disease Brother   . Rheum arthritis Brother   . Diabetes Brother   . Colon cancer Neg Hx   . Stomach cancer Neg Hx       Social History Social History   Tobacco Use  . Smoking status: Former Smoker    Packs/day: 0.80    Years: 20.00    Pack years: 16.00    Types: Cigarettes    Last attempt to quit: 03/09/1998    Years since quitting: 19.1  . Smokeless tobacco: Never Used  Substance Use Topics  . Alcohol use: No    Alcohol/week: 0.0 oz    Comment: rarely  . Drug use: No     Allergies   Citrus; Dust mite extract; Naproxen; and Other   Review of Systems Review of Systems  Constitutional:       Per HPI, otherwise negative  HENT:       Per HPI, otherwise negative  Respiratory:       Per HPI, otherwise negative  Cardiovascular:       Per HPI, otherwise negative  Gastrointestinal: Negative for vomiting.  Endocrine:       Negative aside from HPI  Genitourinary:       Neg aside from HPI   Musculoskeletal:       Per HPI, otherwise negative  Skin: Negative.   Neurological: Positive for headaches. Negative for syncope.     Physical Exam Updated Vital Signs BP 116/73   Pulse 62   Temp 98.4 F (36.9 C) (Oral)   Resp 17   Ht 5' 4"  (1.626 m)   Wt 115.2 kg (254 lb)   SpO2 95%   BMI 43.60 kg/m   Physical Exam  Constitutional: She is oriented to person, place, and time. She appears well-developed and well-nourished. No distress.  HENT:  Head: Normocephalic and atraumatic.  Eyes: Conjunctivae and EOM are normal.  Neck: Muscular tenderness present. No spinous process tenderness present. No neck rigidity. Normal range of motion present.    Cardiovascular: Normal rate and regular rhythm.  Pulmonary/Chest: Effort normal and breath sounds normal. No stridor. No respiratory distress.  Abdominal: She exhibits no distension.  Musculoskeletal: She exhibits no edema.  Neurological: She is alert and oriented to person, place, and time. No cranial nerve deficit.  Skin: Skin is warm and dry.  Psychiatric: She has a normal mood and affect.  Nursing note and vitals reviewed.    ED  Treatments / Results  Labs (all labs ordered are listed, but only abnormal results are displayed) Labs Reviewed  BASIC METABOLIC PANEL - Abnormal; Notable for the following components:      Result Value   GFR calc non Af Amer 58 (*)    All other components within normal limits  CBC    EKG  EKG Interpretation  Date/Time:  Monday April 12 2017 13:16:43 EST Ventricular Rate:  60 PR Interval:  154 QRS Duration: 80 QT Interval:  410 QTC Calculation: 410 R Axis:  65 Text Interpretation:  Normal sinus rhythm Baseline wander T wave abnormality Abnormal ekg Confirmed by Carmin Muskrat [phone]) on 04/12/2017 8:54:14 PM       Radiology No results found.  Procedures Procedures (including critical care time)  Medications Ordered in ED Medications  diclofenac (FLECTOR) 1.3 % 1 patch (not administered)  acetaminophen (TYLENOL) tablet 1,000 mg (1,000 mg Oral Given 04/12/17 2002)  tiZANidine (ZANAFLEX) tablet 2 mg (2 mg Oral Given 04/12/17 2001)     Initial Impression / Assessment and Plan / ED Course  I have reviewed the triage vital signs and the nursing notes.  Pertinent labs & imaging results that were available during my care of the patient were reviewed by me and considered in my medical decision making (see chart for details).     8:53 PM Patient feeling better, looks calm, is resting, with her significant other next to her on the gurney. No new complaints per  This elderly female presents with ongoing pain in her head and neck. Given the patient's physical exam, absence of focal neurologic deficits, there is suspicion for musculoskeletal etiology. Little suspicion for intracranial pathology. Patient improved here with anti-inflammatories, topical medication, and given the reassuring findings, improvement in her condition, she was discharged in stable condition to follow-up with primary care.  Final Clinical Impressions(s) / ED Diagnoses   Final diagnoses:  Bad headache       Carmin Muskrat, MD 04/12/17 2054

## 2017-04-12 NOTE — Discharge Instructions (Signed)
As discussed, your evaluation today has been largely reassuring.  But, it is important that you monitor your condition carefully, and do not hesitate to return to the ED if you develop new, or concerning changes in your condition. ? ?Otherwise, please follow-up with your physician for appropriate ongoing care. ? ?

## 2017-05-18 ENCOUNTER — Ambulatory Visit: Payer: Self-pay

## 2017-05-18 ENCOUNTER — Ambulatory Visit (INDEPENDENT_AMBULATORY_CARE_PROVIDER_SITE_OTHER): Payer: Medicare Other

## 2017-05-18 ENCOUNTER — Ambulatory Visit (INDEPENDENT_AMBULATORY_CARE_PROVIDER_SITE_OTHER): Payer: Medicare Other | Admitting: Urgent Care

## 2017-05-18 ENCOUNTER — Encounter: Payer: Self-pay | Admitting: Urgent Care

## 2017-05-18 VITALS — BP 118/78 | HR 68 | Temp 98.3°F | Resp 18 | Ht 64.0 in | Wt 264.4 lb

## 2017-05-18 DIAGNOSIS — M25571 Pain in right ankle and joints of right foot: Secondary | ICD-10-CM | POA: Diagnosis not present

## 2017-05-18 DIAGNOSIS — M25473 Effusion, unspecified ankle: Secondary | ICD-10-CM

## 2017-05-18 DIAGNOSIS — E119 Type 2 diabetes mellitus without complications: Secondary | ICD-10-CM

## 2017-05-18 DIAGNOSIS — M25475 Effusion, left foot: Secondary | ICD-10-CM

## 2017-05-18 DIAGNOSIS — M19071 Primary osteoarthritis, right ankle and foot: Secondary | ICD-10-CM

## 2017-05-18 DIAGNOSIS — M25476 Effusion, unspecified foot: Secondary | ICD-10-CM | POA: Diagnosis not present

## 2017-05-18 LAB — POCT CBC
Granulocyte percent: 63.4 %G (ref 37–80)
HCT, POC: 36.5 % — AB (ref 37.7–47.9)
Hemoglobin: 12.1 g/dL — AB (ref 12.2–16.2)
LYMPH, POC: 1.6 (ref 0.6–3.4)
MCH, POC: 29 pg (ref 27–31.2)
MCHC: 33 g/dL (ref 31.8–35.4)
MCV: 87.9 fL (ref 80–97)
MID (CBC): 0.3 (ref 0–0.9)
MPV: 7.9 fL (ref 0–99.8)
PLATELET COUNT, POC: 233 10*3/uL (ref 142–424)
POC Granulocyte: 3.3 (ref 2–6.9)
POC LYMPH %: 30.4 % (ref 10–50)
POC MID %: 6.2 %M (ref 0–12)
RBC: 4.16 M/uL (ref 4.04–5.48)
RDW, POC: 14.3 %
WBC: 5.2 10*3/uL (ref 4.6–10.2)

## 2017-05-18 LAB — POCT GLYCOSYLATED HEMOGLOBIN (HGB A1C): HEMOGLOBIN A1C: 6.2

## 2017-05-18 MED ORDER — LOVASTATIN 20 MG PO TABS: 20.0000 mg | ORAL_TABLET | Freq: Every day | ORAL | 0 refills | Status: DC

## 2017-05-18 MED ORDER — MELOXICAM 7.5 MG PO TABS: 7.5000 mg | ORAL_TABLET | Freq: Every day | ORAL | 0 refills | Status: DC

## 2017-05-18 NOTE — Progress Notes (Signed)
MRN: [id number] DOB: [birth year]  Subjective:   Jocelyn Little is a 67 y.o. female presenting for 4 day history of bilateral ankle swelling, pain, R>L. Notes that she had a couple of blisters now resolving over her left dorsal foot. Has not tried any medications for relief. Denies fever, redness, warmth, trauma, falls. Denies history of gout. Her last diabetes check in December 2018 was great, a1c <7%. Patient requested medication refills for her chronic medical conditions.  Jocelyn Little has a current medication list which includes the following prescription(s): albuterol, amlodipine, aspirin, blood glucose meter kit and supplies, bupropion, diclofenac, esomeprazole, liraglutide, lovastatin, metformin, metoprolol tartrate, multivitamin, restasis, and vortioxetine hbr, and the following Facility-Administered Medications: sodium chloride. Also is allergic to citrus; dust mite extract; naproxen; and other.  Jocelyn Little  has a past medical history of Diabetes mellitus without complication ([place]), GERD (gastroesophageal reflux disease), High cholesterol, HTN (hypertension), OSA (obstructive sleep apnea), and Rhinitis. Also  has a past surgical history that includes Uvulopalatopharyngoplasty; Cholecystectomy; Total abdominal hysterectomy; and Lipoma excision.  Objective:   Vitals: BP (!) 154/82   Pulse 68   Temp 98.3 F (36.8 C) (Oral)   Resp 18   Ht 5' 4"  (1.626 m)   Wt 264 lb 6.4 oz (119.9 kg)   SpO2 98%   BMI 45.38 kg/m   BP Readings from Last 3 Encounters:  05/18/17 (!) 154/82  04/12/17 (!) 150/53  02/08/17 128/72    Physical Exam  Constitutional: She is oriented to person, place, and time. She appears well-developed and well-nourished.  Cardiovascular: Normal rate.  Pulmonary/Chest: Effort normal.  Musculoskeletal:       Right ankle: She exhibits decreased range of motion and swelling. She exhibits no ecchymosis, no deformity and no laceration. Tenderness (over areas depicted). Lateral  malleolus and AITFL tenderness found. No medial malleolus, no posterior TFL and no head of 5th metatarsal tenderness found. Achilles tendon exhibits no pain and no defect.       Feet:  Neurological: She is alert and oriented to person, place, and time.   Results for orders placed or performed in visit on 05/18/17 (from the past 24 hour(s))  POCT glycosylated hemoglobin (Hb A1C)     Status: Normal   Collection Time: 05/18/17  2:43 PM  Result Value Ref Range   Hemoglobin A1C 6.2   POCT CBC     Status: Abnormal   Collection Time: 05/18/17  2:44 PM  Result Value Ref Range   WBC 5.2 4.6 - 10.2 K/uL   Lymph, poc 1.6 0.6 - 3.4   POC LYMPH PERCENT 30.4 10 - 50 %L   MID (cbc) 0.3 0 - 0.9   POC MID % 6.2 0 - 12 %M   POC Granulocyte 3.3 2 - 6.9   Granulocyte percent 63.4 37 - 80 %G   RBC 4.16 4.04 - 5.48 M/uL   Hemoglobin 12.1 (A) 12.2 - 16.2 g/dL   HCT, POC 36.5 (A) 37.7 - 47.9 %   MCV 87.9 80 - 97 fL   MCH, POC 29.0 27 - 31.2 pg   MCHC 33.0 31.8 - 35.4 g/dL   RDW, POC 14.3 %   Platelet Count, POC 233 142 - 424 K/uL   MPV 7.9 0 - 99.8 fL   Dg Ankle Complete Right  Result Date: 05/18/2017 CLINICAL DATA:  Right ankle pain.  Swelling.  No injury. EXAM: RIGHT ANKLE - COMPLETE 3+ VIEW COMPARISON:  No recent prior. FINDINGS: Diffuse soft tissue swelling. Diffuse degenerative change. No  acute bony abnormality. No evidence of fracture or dislocation. IMPRESSION: Diffuse soft tissue swelling. Diffuse degenerative change. No acute bony abnormality. Electronically Signed   By: Marcello Moores  Register   On: 05/18/2017 15:02    Assessment and Plan :   Bilateral swelling of feet and ankles - Plan: Uric acid, POCT CBC, Sedimentation Rate, DG Ankle Complete Right  Acute right ankle pain - Plan: DG Ankle Complete Right  Controlled type 2 diabetes mellitus without complication, without long-term current use of insulin (HCC) - Plan: POCT glycosylated hemoglobin (Hb F9U), Basic metabolic panel  Osteoarthritis  of right ankle, unspecified osteoarthritis type  Well controlled diabetes mellitus ([place]) - Plan: lovastatin (MEVACOR) 20 MG tablet  Will manage for arthritis as seen on x-ray report. Start meloxicam. Counseled patient on potential for adverse effects with medications prescribed today, patient verbalized understanding. Return-to-clinic precautions discussed, patient verbalized understanding. I filled her lovastatin and recommended patient set up an office visit with her PCP for continued management of her chronic medical conditions including medication refills. Patient was in agreement.  Jaynee Eagles, PA-C Primary Care at [facility] [phone] 05/18/2017  2:06 PM

## 2017-05-18 NOTE — Patient Instructions (Addendum)
Arthritis Arthritis is a term that is commonly used to refer to joint pain or joint disease. There are more than 100 types of arthritis. What are the causes? The most common cause of this condition is wear and tear of a joint. Other causes include:  Gout.  Inflammation of a joint.  An infection of a joint.  Sprains and other injuries near the joint.  A drug reaction or allergic reaction.  In some cases, the cause may not be known. What are the signs or symptoms? The main symptom of this condition is pain in the joint with movement. Other symptoms include:  Redness, swelling, or stiffness at a joint.  Warmth coming from the joint.  Fever.  Overall feeling of illness.  How is this diagnosed? This condition may be diagnosed with a physical exam and tests, including:  Blood tests.  Urine tests.  Imaging tests, such as MRI, X-rays, or a CT scan.  Sometimes, fluid is removed from a joint for testing. How is this treated? Treatment for this condition may involve:  Treatment of the cause, if it is known.  Rest.  Raising (elevating) the joint.  Applying cold or hot packs to the joint.  Medicines to improve symptoms and reduce inflammation.  Injections of a steroid such as cortisone into the joint to help reduce pain and inflammation.  Depending on the cause of your arthritis, you may need to make lifestyle changes to reduce stress on your joint. These changes may include exercising more and losing weight. Follow these instructions at home: Medicines  Take over-the-counter and prescription medicines only as told by your health care provider.  Do not take aspirin to relieve pain if gout is suspected. Activity  Rest your joint if told by your health care provider. Rest is important when your disease is active and your joint feels painful, swollen, or stiff.  Avoid activities that make the pain worse. It is important to balance activity with rest.  Exercise your  joint regularly with range-of-motion exercises as told by your health care provider. Try doing low-impact exercise, such as: ? Swimming. ? Water aerobics. ? Biking. ? Walking. Joint Care   If your joint is swollen, keep it elevated if told by your health care provider.  If your joint feels stiff in the morning, try taking a warm shower.  If directed, apply heat to the joint. If you have diabetes, do not apply heat without permission from your health care provider. ? Put a towel between the joint and the hot pack or heating pad. ? Leave the heat on the area for 20-30 minutes.  If directed, apply ice to the joint: ? Put ice in a plastic bag. ? Place a towel between your skin and the bag. ? Leave the ice on for 20 minutes, 2-3 times per day.  Keep all follow-up visits as told by your health care provider. This is important. Contact a health care provider if:  The pain gets worse.  You have a fever. Get help right away if:  You develop severe joint pain, swelling, or redness.  Many joints become painful and swollen.  You develop severe back pain.  You develop severe weakness in your leg.  You cannot control your bladder or bowels. This information is not intended to replace advice given to you by your health care provider. Make sure you discuss any questions you have with your health care provider. Document Released: 04/02/2004 Document Revised: 08/01/2015 Document Reviewed: 05/21/2014 Elsevier Interactive Patient   Education  2018 [employer]     IF you received an x-ray today, you will receive an invoice from [facility]. Please contact [facility] at [phone] with questions or concerns regarding your invoice.   IF you received labwork today, you will receive an invoice from [facility]. Please contact [facility] at [phone] with questions or concerns regarding your invoice.   Our billing staff will not be able to assist you with questions  regarding bills from these companies.  You will be contacted with the lab results as soon as they are available. The fastest way to get your results is to activate your My Chart account. Instructions are located on the last page of this paperwork. If you have not heard from us regarding the results in 2 weeks, please contact this office.      

## 2017-05-18 NOTE — Telephone Encounter (Signed)
Pt. Reports that last Friday her feet began itching. She scratched both feet and applied hydrocortisone. Noticed blisters on feet soon after that.Having pain and itching.  Has "some swelling - able to put tennis shoes on feet." Reports blisters are going away but a few are still visible. Reports she has never had anything like this before. Appointment scheduled.   Reason for Disposition . [1] Very swollen joint AND [2] no fever  Answer Assessment - Initial Assessment Questions 1. LOCATION: "Which joint is swollen?"     Both feet and ankles 2. ONSET: "When did the swelling start?"     Started a few days ago - Friday 3. SIZE: "How large is the swelling?"     Can wear a shoe 4. PAIN: "Is there any pain?" If so, ask: "How bad is it?" (Scale 1-10; or mild, moderate, severe)     10 5. CAUSE: "What do you think caused the swollen joint?"     Unsure 6. OTHER SYMPTOMS: "Do you have any other symptoms?" (e.g., fever, chest pain, difficulty breathing, calf pain)     Feet and ankles are swollen - developed blisters. 7. PREGNANCY: "Is there any chance you are pregnant?" "When was your last menstrual period?"     No  Protocols used: ANKLE SWELLING-A-AH

## 2017-05-19 LAB — BASIC METABOLIC PANEL
BUN / CREAT RATIO: 15 (ref 12–28)
BUN: 13 mg/dL (ref 8–27)
CHLORIDE: 101 mmol/L (ref 96–106)
CO2: 26 mmol/L (ref 20–29)
Calcium: 9 mg/dL (ref 8.7–10.3)
Creatinine, Ser: 0.88 mg/dL (ref 0.57–1.00)
GFR calc non Af Amer: 69 mL/min/{1.73_m2} (ref >59)
GFR, EST AFRICAN AMERICAN: 80 mL/min/{1.73_m2} (ref >59)
Glucose: 90 mg/dL (ref 65–99)
POTASSIUM: 4.2 mmol/L (ref 3.5–5.2)
SODIUM: 141 mmol/L (ref 134–144)

## 2017-05-19 LAB — SEDIMENTATION RATE: SED RATE: 16 mm/h (ref 0–40)

## 2017-05-19 LAB — URIC ACID: URIC ACID: 4.3 mg/dL (ref 2.5–7.1)

## 2017-05-20 ENCOUNTER — Other Ambulatory Visit: Payer: Self-pay

## 2017-05-20 MED ORDER — ESOMEPRAZOLE MAGNESIUM 40 MG PO CPDR: 40.0000 mg | DELAYED_RELEASE_CAPSULE | ORAL | 0 refills | Status: DC | PRN

## 2017-05-25 ENCOUNTER — Ambulatory Visit: Payer: Medicare Other | Admitting: Urgent Care

## 2017-05-26 ENCOUNTER — Ambulatory Visit: Payer: Medicare Other | Admitting: Physician Assistant

## 2017-06-02 ENCOUNTER — Encounter: Payer: Self-pay | Admitting: Physician Assistant

## 2017-06-02 ENCOUNTER — Ambulatory Visit (INDEPENDENT_AMBULATORY_CARE_PROVIDER_SITE_OTHER): Payer: Medicare Other | Admitting: Physician Assistant

## 2017-06-02 ENCOUNTER — Other Ambulatory Visit: Payer: Self-pay

## 2017-06-02 VITALS — BP 122/62 | HR 84 | Temp 98.7°F | Resp 18 | Ht 64.0 in | Wt 262.2 lb

## 2017-06-02 DIAGNOSIS — M25571 Pain in right ankle and joints of right foot: Secondary | ICD-10-CM

## 2017-06-02 DIAGNOSIS — M25751 Osteophyte, right hip: Secondary | ICD-10-CM

## 2017-06-02 DIAGNOSIS — G8929 Other chronic pain: Secondary | ICD-10-CM | POA: Diagnosis not present

## 2017-06-02 MED ORDER — COLCHICINE 0.6 MG PO TABS: ORAL_TABLET | ORAL | 0 refills | Status: DC

## 2017-06-02 MED ORDER — FUROSEMIDE 20 MG PO TABS: 20.0000 mg | ORAL_TABLET | ORAL | 0 refills | Status: DC

## 2017-06-02 NOTE — Patient Instructions (Addendum)
  Come bac in one month.    Colchicine first. Then the fluid pill.    IF you received an x-ray today, you will receive an [facility]. Please contact [facility] at [phone] with questions or concerns regarding your invoice.   IF you received labwork today, you will receive an invoice from [facility]. Please contact [facility] at [phone] with questions or concerns regarding your invoice.   Our billing staff will not be able to assist you with questions regarding bills from these companies.  You will be contacted with the lab results as soon as they are available. The fastest way to get your results is to activate your My Chart account. Instructions are located on the last page of this paperwork. If you have not heard from us regarding the results in 2 weeks, please contact this office.

## 2017-06-02 NOTE — Progress Notes (Signed)
06/04/2017 9:14 AM   DOB: 21-Aug-1950 / MRN: [id number]  SUBJECTIVE:  Jocelyn Little is a 67 y.o. female presenting for follow-up of ankle pain and swelling.  Most recent workup was negative for gout, inflammatory arthritis.  She is treated with an NSAID and states "this did not help at all."  X-ray did reveal arthritis and diffuse swelling about the ankle.  She is allergic to citrus; dust mite extract; naproxen; and other.   She  has a past medical history of Diabetes mellitus without complication (HCC), GERD (gastroesophageal reflux disease), High cholesterol, HTN (hypertension), OSA (obstructive sleep apnea), and Rhinitis.    She  reports that she quit smoking about 19 years ago. Her smoking use included cigarettes. She has a 16.00 pack-year smoking history. She has never used smokeless tobacco. She reports that she does not drink alcohol or use drugs. She  reports that she currently engages in sexual activity. She reports using the following method of birth control/protection: Surgical. The patient  has a past surgical history that includes Uvulopalatopharyngoplasty; Cholecystectomy; Total abdominal hysterectomy; and Lipoma excision.  Her family history includes Asthma in her brother; Breast cancer in her mother; Diabetes in her brother; Emphysema in her father; Heart disease in her brother and mother; Lung cancer in her father; Rheum arthritis in her brother and father.  Review of Systems  Constitutional: Negative for chills, diaphoresis and fever.  Respiratory: Negative for cough, hemoptysis, sputum production, shortness of breath and wheezing.   Cardiovascular: Negative for chest pain, orthopnea and leg swelling.  Gastrointestinal: Negative for nausea.  Skin: Negative for rash.  Neurological: Negative for dizziness.    The problem list and medications were reviewed and updated by myself where necessary and exist elsewhere in the encounter.   OBJECTIVE:  BP 122/62   Pulse 84    Temp 98.7 F (37.1 C) (Oral)   Resp 18   Ht 5\' 4"  (1.626 m)   Wt 262 lb 3.2 oz (118.9 kg)   SpO2 99%   BMI 45.01 kg/m  Ankle t Lab Results  Component Value Date   CREATININE 0.88 05/18/2017   BUN 13 05/18/2017   NA 141 05/18/2017   K 4.2 05/18/2017   CL 101 05/18/2017   CO2 26 05/18/2017     Physical Exam  Constitutional: She is active.  Non-toxic appearance.  Cardiovascular: Normal rate.  Pulmonary/Chest: Effort normal. No tachypnea.  Musculoskeletal: She exhibits tenderness (Diffuse right ankle swelling with diffuse bony tenderness.).  Neurological: She is alert.  Skin: Skin is warm and dry. She is not diaphoretic. No pallor.    Results for orders placed or performed in visit on 06/02/17 (from the past 72 hour(s))  Sedimentation Rate     Status: None   Collection Time: 06/02/17  5:21 PM  Result Value Ref Range   Sed Rate 18 0 - 40 mm/hr  C-reactive protein     Status: Abnormal   Collection Time: 06/02/17  5:21 PM  Result Value Ref Range   CRP 5.9 (H) 0.0 - 4.9 mg/L    No results found.  ASSESSMENT AND PLAN:  Jocelyn Little was seen today for arthritis.  Diagnoses and all orders for this visit:  Chronic pain of right ankle CRP is mildly elevated sed rate is normal.  I will screen for rheumatoid arthritis however I do not have high hopes.  I think this is most likely arthritis with pain recalcitrant to medium dose of meloxicam.  a gout etiology is possible and  colchicine but certainly not hurt to try.  Given the bilateral swelling in her ankles her pain may also be related to excessive fluid.  If  colchicine fails I will try a gentle diuresis.  She will come back in 1 month and if her pain remains she will have failed NSAIDs at that time and I can check an MRI. -     Sedimentation Rate -     C-reactive protein  Other orders -     colchicine 0.6 MG tablet; Take two tabs at once and the one tab on hour later. Don't take you cholesterol medication the day you take  this. -     furosemide (LASIX) 20 MG tablet; Take 1 tablet (20 mg total) by mouth every other day. Take for three doses then stop.    The patient is advised to call or return to clinic if she does not see an improvement in symptoms, or to seek the care of the closest emergency department if she worsens with the above plan.   Deliah Boston, MHS, PA-C Primary Care at [facility] 06/04/2017 9:14 AM

## 2017-06-03 LAB — C-REACTIVE PROTEIN: CRP: 5.9 mg/L — AB (ref 0.0–4.9)

## 2017-06-03 LAB — SEDIMENTATION RATE: Sed Rate: 18 mm/hr (ref 0–40)

## 2017-06-09 DIAGNOSIS — M25571 Pain in right ankle and joints of right foot: Secondary | ICD-10-CM | POA: Diagnosis not present

## 2017-06-09 DIAGNOSIS — G8929 Other chronic pain: Secondary | ICD-10-CM | POA: Diagnosis not present

## 2017-06-09 NOTE — Addendum Note (Signed)
Addended by: Baldwin CrownJOHNSON, Brittnee Gaetano D on: 06/09/2017 11:50 AM   Modules accepted: Orders

## 2017-06-10 LAB — CYCLIC CITRUL PEPTIDE ANTIBODY, IGG/IGA: Cyclic Citrullin Peptide Ab: 6 units (ref 0–19)

## 2017-06-10 LAB — RHEUMATOID FACTOR: Rhuematoid fact SerPl-aCnc: <10 IU/mL (ref 0.0–13.9)

## 2017-06-28 ENCOUNTER — Other Ambulatory Visit: Payer: Self-pay | Admitting: Physician Assistant

## 2017-06-28 DIAGNOSIS — R6882 Decreased libido: Secondary | ICD-10-CM

## 2017-06-28 NOTE — Telephone Encounter (Signed)
Copied from CRM [id number]. Topic: Quick Communication - Rx Refill/Question >> Jun 28, 2017  3:59 PM Alexander BergeronBarksdale, Jocelyn B wrote: Medication: lovastatin (MEVACOR) 20 MG tablet [[id number]][[id number]] , vortioxetine HBr (TRINTELLIX) 20 MG TABS [[id number]][[id number]] , buPROPion (WELLBUTRIN XL) 150 MG 24 hr tablet [[id number]][[id number]]  Has the patient contacted their pharmacy? Yes.   (Agent: If no, request that the patient contact the pharmacy for the refill.) Preferred Pharmacy (with phone number or street name): [facility] on [address] Agent: Please be advised that RX refills may take up to 3 business days. We ask that you follow-up with your pharmacy.

## 2017-06-29 ENCOUNTER — Telehealth: Payer: Self-pay | Admitting: Physician Assistant

## 2017-06-29 MED ORDER — BUPROPION HCL ER (XL) 150 MG PO TB24: 150.0000 mg | ORAL_TABLET | Freq: Every day | ORAL | 0 refills | Status: DC

## 2017-06-29 NOTE — Telephone Encounter (Signed)
Left detailed vm advising to use goodrx for about $100 off medication.

## 2017-06-29 NOTE — Telephone Encounter (Signed)
Was unable to speak to patient at this time.

## 2017-06-29 NOTE — Telephone Encounter (Signed)
Called patient regarding her request for her medications: bupropion XL 150 mg 24 hr tab, lovastatin 20 mg tab, and vortioxetine HBr 20 mg tabs. She is requesting a coupon for the vortioxetine HBr because it is so expensive. Please call pt if there is a coupon for this medication. Her bupropion XL 150 mg was refilled and the lovastain did not need to be refilled right now.  Her last office visit was on 06/02/17. Provider  M. Chestine Sporelark, [place]

## 2017-08-09 ENCOUNTER — Telehealth: Payer: Self-pay | Admitting: Physician Assistant

## 2017-08-09 ENCOUNTER — Other Ambulatory Visit: Payer: Self-pay | Admitting: Physician Assistant

## 2017-08-09 DIAGNOSIS — I10 Essential (primary) hypertension: Secondary | ICD-10-CM

## 2017-08-09 NOTE — Telephone Encounter (Signed)
Copied from CRM [id number]. Topic: Quick Communication - Rx Refill/Question >> Aug 09, 2017  5:05 PM Alexander BergeronBarksdale, Harvey B wrote: Medication: vortioxetine HBr (TRINTELLIX) 20 MG TABS [[id number]][[id number]] , amLODipine (NORVASC) 5 MG tablet [[id number]][[id number]] , esomeprazole (NEXIUM) 40 MG capsule [[id number]][[id number]]   Has the patient contacted their pharmacy? Yes.   (Agent: If no, request that the patient contact the pharmacy for the refill.) (Agent: If yes, when and what did the pharmacy advise?)  Preferred Pharmacy (with phone number or street name): [facility]  Agent: Please be advised that RX refills may take up to 3 business days. We ask that you follow-up with your pharmacy.

## 2017-08-11 ENCOUNTER — Ambulatory Visit (INDEPENDENT_AMBULATORY_CARE_PROVIDER_SITE_OTHER): Payer: 59 | Admitting: Physician Assistant

## 2017-08-11 ENCOUNTER — Encounter: Payer: Self-pay | Admitting: Physician Assistant

## 2017-08-11 ENCOUNTER — Other Ambulatory Visit: Payer: Self-pay

## 2017-08-11 VITALS — BP 124/62 | HR 62 | Temp 98.2°F | Ht 64.0 in | Wt 267.2 lb

## 2017-08-11 DIAGNOSIS — R062 Wheezing: Secondary | ICD-10-CM

## 2017-08-11 DIAGNOSIS — F3342 Major depressive disorder, recurrent, in full remission: Secondary | ICD-10-CM | POA: Diagnosis not present

## 2017-08-11 DIAGNOSIS — R6882 Decreased libido: Secondary | ICD-10-CM

## 2017-08-11 DIAGNOSIS — I10 Essential (primary) hypertension: Secondary | ICD-10-CM | POA: Diagnosis not present

## 2017-08-11 DIAGNOSIS — E119 Type 2 diabetes mellitus without complications: Secondary | ICD-10-CM | POA: Diagnosis not present

## 2017-08-11 DIAGNOSIS — K219 Gastro-esophageal reflux disease without esophagitis: Secondary | ICD-10-CM

## 2017-08-11 DIAGNOSIS — M25579 Pain in unspecified ankle and joints of unspecified foot: Secondary | ICD-10-CM | POA: Diagnosis not present

## 2017-08-11 MED ORDER — VORTIOXETINE HBR 20 MG PO TABS: 20.0000 mg | ORAL_TABLET | Freq: Every day | ORAL | 3 refills | Status: AC

## 2017-08-11 MED ORDER — ALBUTEROL SULFATE HFA 108 (90 BASE) MCG/ACT IN AERS: 2.0000 | INHALATION_SPRAY | Freq: Four times a day (QID) | RESPIRATORY_TRACT | 11 refills | Status: AC | PRN

## 2017-08-11 MED ORDER — LOVASTATIN 20 MG PO TABS: 20.0000 mg | ORAL_TABLET | Freq: Every day | ORAL | 3 refills | Status: DC

## 2017-08-11 MED ORDER — MELOXICAM 7.5 MG PO TABS: 7.5000 mg | ORAL_TABLET | Freq: Every day | ORAL | 5 refills | Status: AC

## 2017-08-11 MED ORDER — METOPROLOL TARTRATE 50 MG PO TABS: 50.0000 mg | ORAL_TABLET | Freq: Every day | ORAL | 3 refills | Status: DC

## 2017-08-11 MED ORDER — BUPROPION HCL ER (XL) 150 MG PO TB24: 150.0000 mg | ORAL_TABLET | Freq: Every day | ORAL | 3 refills | Status: AC

## 2017-08-11 MED ORDER — AMLODIPINE BESYLATE 5 MG PO TABS
5.0000 mg | ORAL_TABLET | Freq: Every day | ORAL | 3 refills | Status: DC
Start: 2017-08-11 — End: 2018-09-08

## 2017-08-11 MED ORDER — METFORMIN HCL 500 MG PO TABS: 500.0000 mg | ORAL_TABLET | Freq: Every day | ORAL | 3 refills | Status: AC

## 2017-08-11 MED ORDER — ESOMEPRAZOLE MAGNESIUM 40 MG PO CPDR: 40.0000 mg | DELAYED_RELEASE_CAPSULE | ORAL | 11 refills | Status: AC | PRN

## 2017-08-11 NOTE — Telephone Encounter (Signed)
Patient was seen in the office today and medication already filled.

## 2017-08-11 NOTE — Patient Instructions (Addendum)
  Come back in about 6 month for a physical.    IF you received an x-ray today, you will receive an invoice from [facility]. Please contact [facility] at [phone] with questions or concerns regarding your invoice.   IF you received labwork today, you will receive an invoice from [facility]. Please contact [facility] at [phone] with questions or concerns regarding your invoice.   Our billing staff will not be able to assist you with questions regarding bills from these companies.  You will be contacted with the lab results as soon as they are available. The fastest way to get your results is to activate your My Chart account. Instructions are located on the last page of this paperwork. If you have not heard from us regarding the results in 2 weeks, please contact this office.

## 2017-08-11 NOTE — Progress Notes (Signed)
08/11/2017 11:45 AM   DOB: 03-01-1951 / MRN: [id number]  SUBJECTIVE:  Jocelyn Little is a 67 y.o. female presenting for medication refill.  Hypertension: Historically well controlled with 50 metoprolol daily, medium dose Norvasc.  Patient compliant with medication and does not miss doses.  She denies chest pain, shortness of breath, vision changes, leg swelling.  Well-controlled diabetes: She takes metformin daily 500 mg in the morning and does not miss doses.  Last A1c was between 6 and 7.  She denies polyuria polydipsia, sock and glove paresthesia.  She tries to monitor her carbohydrate intake.  She does take lovastatin as prescribed.  GERD: She takes Nexium as needed.  There are sometimes months that she needs to take it daily and then there are some months where she only needs to take a few.  She stays on top of her symptoms and understands that taking the medication in the morning is the most helpful.  She has a history of ankle pain with an unclear etiology.  Feels like the meloxicam is helpful with this.  She has a history of depression well-controlled on Trintellix and given the sexual side effects associated with this was started on Wellbutrin which did improve her sex drive.   She is allergic to citrus; dust mite extract; naproxen; and other.   She  has a past medical history of Diabetes mellitus without complication (HCC), GERD (gastroesophageal reflux disease), High cholesterol, HTN (hypertension), OSA (obstructive sleep apnea), and Rhinitis.    She  reports that she quit smoking about 19 years ago. Her smoking use included cigarettes. She has a 16.00 pack-year smoking history. She has never used smokeless tobacco. She reports that she does not drink alcohol or use drugs. She  reports that she currently engages in sexual activity. She reports using the following method of birth control/protection: Surgical. The patient  has a past surgical history that includes  Uvulopalatopharyngoplasty; Cholecystectomy; Total abdominal hysterectomy; and Lipoma excision.  Her family history includes Asthma in her brother; Breast cancer in her mother; Diabetes in her brother; Emphysema in her father; Heart disease in her brother and mother; Lung cancer in her father; Rheum arthritis in her brother and father.  ROS  The problem list and medications were reviewed and updated by myself where necessary and exist elsewhere in the encounter.   OBJECTIVE:  BP 124/62 (BP Location: Left Arm, Patient Position: Sitting, Cuff Size: Large)   Pulse 62   Temp 98.2 F (36.8 C) (Oral)   Ht 5\' 4"  (1.626 m)   Wt 267 lb 3.2 oz (121.2 kg)   SpO2 94%   BMI 45.86 kg/m   Physical Exam  No results found for this or any previous visit (from the past 72 hour(s)).  No results found.  Lab Results  Component Value Date   CREATININE 0.88 05/18/2017   Lab Results  Component Value Date   HGBA1C 6.2 05/18/2017   Lab Results  Component Value Date   WBC 5.2 05/18/2017   HGB 12.1 (A) 05/18/2017   HCT 36.5 (A) 05/18/2017   MCV 87.9 05/18/2017   PLT 235 04/12/2017      ASSESSMENT AND PLAN:  [name] was seen today for medication refill.  I think she is doing exquisitely well at this time and needs no medication adjustment.  I am rechecking her lab work today and have advised that she come back in about 6 months for a physical.  I have written most of her medications for a  year.  Diagnoses and all orders for this visit:  Well-controlled hypertension -     metoprolol tartrate (LOPRESSOR) 50 MG tablet; Take 1 tablet (50 mg total) by mouth daily.  Decreased libido -     buPROPion (WELLBUTRIN XL) 150 MG 24 hr tablet; Take 1 tablet (150 mg total) by mouth daily.  Well controlled diabetes mellitus (HCC) -     metFORMIN (GLUCOPHAGE) 500 MG tablet; Take 1 tablet (500 mg total) by mouth daily with breakfast. -     lovastatin (MEVACOR) 20 MG tablet; Take 1 tablet (20 mg total) by  mouth daily at 6 PM. -     Hemoglobin A1c -     Hepatic function panel -     Lipid panel  Essential hypertension -     amLODipine (NORVASC) 5 MG tablet; Take 1 tablet (5 mg total) by mouth daily. -     Basic metabolic panel  Pain in joint involving ankle and foot, unspecified laterality -     meloxicam (MOBIC) 7.5 MG tablet; Take 1-2 tablets (7.5-15 mg total) by mouth daily.  Wheezing -     albuterol (PROVENTIL HFA;VENTOLIN HFA) 108 (90 Base) MCG/ACT inhaler; Inhale 2 puffs into the lungs every 6 (six) hours as needed for wheezing.  Gastroesophageal reflux disease without esophagitis -     esomeprazole (NEXIUM) 40 MG capsule; Take 1 capsule (40 mg total) by mouth as needed (for indigestion). -     CBC  Recurrent major depressive disorder, in full remission (HCC) -     vortioxetine HBr (TRINTELLIX) 20 MG TABS tablet; Take 1 tablet (20 mg total) by mouth daily.    The patient is advised to call or return to clinic if she does not see an improvement in symptoms, or to seek the care of the closest emergency department if she worsens with the above plan.   Deliah Boston, MHS, PA-C Primary Care at [facility] 08/11/2017 11:45 AM

## 2017-08-12 LAB — HEMOGLOBIN A1C
Est. average glucose Bld gHb Est-mCnc: 134 mg/dL
Hgb A1c MFr Bld: 6.3 % — ABNORMAL HIGH (ref 4.8–5.6)

## 2017-08-12 LAB — CBC
HEMATOCRIT: 36.7 % (ref 34.0–46.6)
HEMOGLOBIN: 11.8 g/dL (ref 11.1–15.9)
MCH: 28.6 pg (ref 26.6–33.0)
MCHC: 32.2 g/dL (ref 31.5–35.7)
MCV: 89 fL (ref 79–97)
Platelets: 239 10*3/uL (ref 150–450)
RBC: 4.12 x10E6/uL (ref 3.77–5.28)
RDW: 14.5 % (ref 12.3–15.4)
WBC: 5.9 10*3/uL (ref 3.4–10.8)

## 2017-08-12 LAB — LIPID PANEL
CHOLESTEROL TOTAL: 160 mg/dL (ref 100–199)
Chol/HDL Ratio: 3 ratio (ref 0.0–4.4)
HDL: 53 mg/dL (ref >39)
LDL Calculated: 84 mg/dL (ref 0–99)
Triglycerides: 115 mg/dL (ref 0–149)
VLDL Cholesterol Cal: 23 mg/dL (ref 5–40)

## 2017-08-12 LAB — BASIC METABOLIC PANEL
BUN / CREAT RATIO: 12 (ref 12–28)
BUN: 13 mg/dL (ref 8–27)
CALCIUM: 8.8 mg/dL (ref 8.7–10.3)
CHLORIDE: 101 mmol/L (ref 96–106)
CO2: 27 mmol/L (ref 20–29)
Creatinine, Ser: 1.1 mg/dL — ABNORMAL HIGH (ref 0.57–1.00)
GFR calc non Af Amer: 53 mL/min/{1.73_m2} — ABNORMAL LOW (ref >59)
GFR, EST AFRICAN AMERICAN: 61 mL/min/{1.73_m2} (ref >59)
Glucose: 100 mg/dL — ABNORMAL HIGH (ref 65–99)
POTASSIUM: 4.3 mmol/L (ref 3.5–5.2)
Sodium: 141 mmol/L (ref 134–144)

## 2017-08-12 LAB — HEPATIC FUNCTION PANEL
ALT: 17 IU/L (ref 0–32)
AST: 15 IU/L (ref 0–40)
Albumin: 3.9 g/dL (ref 3.6–4.8)
Alkaline Phosphatase: 60 IU/L (ref 39–117)
Bilirubin Total: <0.2 mg/dL (ref 0.0–1.2)
Bilirubin, Direct: 0.08 mg/dL (ref 0.00–0.40)
Total Protein: 6.6 g/dL (ref 6.0–8.5)

## 2017-08-24 LAB — HM DIABETES EYE EXAM

## 2017-09-05 ENCOUNTER — Other Ambulatory Visit: Payer: Self-pay | Admitting: Physician Assistant

## 2017-09-05 DIAGNOSIS — I10 Essential (primary) hypertension: Secondary | ICD-10-CM

## 2017-09-22 ENCOUNTER — Encounter: Payer: Self-pay | Admitting: *Deleted

## 2017-11-01 ENCOUNTER — Other Ambulatory Visit: Payer: Self-pay | Admitting: Physician Assistant

## 2017-11-01 DIAGNOSIS — Z1231 Encounter for screening mammogram for malignant neoplasm of breast: Secondary | ICD-10-CM

## 2017-11-10 ENCOUNTER — Telehealth: Payer: Self-pay | Admitting: Family Medicine

## 2017-11-10 NOTE — Telephone Encounter (Signed)
Left a VM in regards to her medication (Albuterol Aer HFA) being approved for a temporary supply. The provider will have to discuss the formulary alternatives and update their prescription. Was [facility] patient. Letter in providers box at nurse station at 102.

## 2017-11-29 ENCOUNTER — Ambulatory Visit: Payer: Medicare Other

## 2017-12-02 ENCOUNTER — Ambulatory Visit
Admission: RE | Admit: 2017-12-02 | Discharge: 2017-12-02 | Disposition: A | Discharge status: Home / Self Care | Payer: 59 | Source: Ambulatory Visit | Attending: Physician Assistant | Admitting: Physician Assistant

## 2017-12-02 ENCOUNTER — Ambulatory Visit: Payer: Medicare Other

## 2017-12-02 DIAGNOSIS — Z1231 Encounter for screening mammogram for malignant neoplasm of breast: Secondary | ICD-10-CM

## 2017-12-03 ENCOUNTER — Other Ambulatory Visit: Payer: Self-pay | Admitting: Physician Assistant

## 2017-12-03 DIAGNOSIS — R928 Other abnormal and inconclusive findings on diagnostic imaging of breast: Secondary | ICD-10-CM

## 2017-12-08 ENCOUNTER — Other Ambulatory Visit: Payer: Medicaid Other

## 2017-12-08 ENCOUNTER — Ambulatory Visit
Admission: RE | Admit: 2017-12-08 | Discharge: 2017-12-08 | Disposition: A | Discharge status: Home / Self Care | Payer: Medicaid Other | Source: Ambulatory Visit | Attending: Family Medicine | Admitting: Family Medicine

## 2017-12-08 DIAGNOSIS — R928 Other abnormal and inconclusive findings on diagnostic imaging of breast: Secondary | ICD-10-CM

## 2018-01-03 ENCOUNTER — Other Ambulatory Visit: Payer: Self-pay | Admitting: Internal Medicine

## 2018-01-03 DIAGNOSIS — Z780 Asymptomatic menopausal state: Secondary | ICD-10-CM

## 2018-01-26 ENCOUNTER — Other Ambulatory Visit: Payer: Medicaid Other

## 2018-01-26 ENCOUNTER — Ambulatory Visit
Admission: RE | Admit: 2018-01-26 | Discharge: 2018-01-26 | Disposition: A | Discharge status: Home / Self Care | Payer: Medicaid Other | Source: Ambulatory Visit | Attending: Internal Medicine | Admitting: Internal Medicine

## 2018-01-26 DIAGNOSIS — Z780 Asymptomatic menopausal state: Secondary | ICD-10-CM

## 2018-02-23 ENCOUNTER — Emergency Department (HOSPITAL_COMMUNITY)
Admission: EM | Admit: 2018-02-23 | Discharge: 2018-02-23 | Disposition: A | Discharge status: Home / Self Care | Payer: 59 | Attending: Emergency Medicine | Admitting: Emergency Medicine

## 2018-02-23 ENCOUNTER — Encounter (HOSPITAL_COMMUNITY): Payer: Self-pay | Admitting: Emergency Medicine

## 2018-02-23 ENCOUNTER — Other Ambulatory Visit: Payer: Self-pay

## 2018-02-23 DIAGNOSIS — Y999 Unspecified external cause status: Secondary | ICD-10-CM | POA: Diagnosis not present

## 2018-02-23 DIAGNOSIS — Y939 Activity, unspecified: Secondary | ICD-10-CM | POA: Insufficient documentation

## 2018-02-23 DIAGNOSIS — E119 Type 2 diabetes mellitus without complications: Secondary | ICD-10-CM | POA: Insufficient documentation

## 2018-02-23 DIAGNOSIS — S61012A Laceration without foreign body of left thumb without damage to nail, initial encounter: Secondary | ICD-10-CM | POA: Diagnosis not present

## 2018-02-23 DIAGNOSIS — W268XXA Contact with other sharp object(s), not elsewhere classified, initial encounter: Secondary | ICD-10-CM | POA: Insufficient documentation

## 2018-02-23 DIAGNOSIS — Z7984 Long term (current) use of oral hypoglycemic drugs: Secondary | ICD-10-CM | POA: Insufficient documentation

## 2018-02-23 DIAGNOSIS — I10 Essential (primary) hypertension: Secondary | ICD-10-CM | POA: Insufficient documentation

## 2018-02-23 DIAGNOSIS — Y929 Unspecified place or not applicable: Secondary | ICD-10-CM | POA: Insufficient documentation

## 2018-02-23 DIAGNOSIS — Z87891 Personal history of nicotine dependence: Secondary | ICD-10-CM | POA: Insufficient documentation

## 2018-02-23 DIAGNOSIS — Z23 Encounter for immunization: Secondary | ICD-10-CM | POA: Insufficient documentation

## 2018-02-23 MED ORDER — CEPHALEXIN 500 MG PO CAPS: 500.0000 mg | ORAL_CAPSULE | Freq: Three times a day (TID) | ORAL | 0 refills | Status: AC

## 2018-02-23 MED ORDER — LIDOCAINE-EPINEPHRINE-TETRACAINE (LET) SOLUTION
3.0000 mL | Freq: Once | NASAL | Status: AC
  Administered 2018-02-23: 3 mL via TOPICAL
  Filled 2018-02-23: qty 3

## 2018-02-23 MED ORDER — CEPHALEXIN 250 MG PO CAPS
500.0000 mg | ORAL_CAPSULE | Freq: Once | ORAL | Status: AC
  Administered 2018-02-23: 500 mg via ORAL
  Filled 2018-02-23: qty 2

## 2018-02-23 MED ORDER — TETANUS-DIPHTH-ACELL PERTUSSIS 5-2.5-18.5 LF-MCG/0.5 IM SUSP
0.5000 mL | Freq: Once | INTRAMUSCULAR | Status: AC
  Administered 2018-02-23: 0.5 mL via INTRAMUSCULAR
  Filled 2018-02-23: qty 0.5

## 2018-02-23 NOTE — ED Provider Notes (Signed)
  Face-to-face evaluation   History: Accidental injury to left thumb, last evening when opening a can.  Physical exam:Left thumb, laceration, gaping slightly, radial aspect, near base.  Neurovascularly and functionally intact distally.  Medical screening examination/treatment/procedure(s) were conducted as a shared visit with non-physician practitioner(s) and myself.  I personally evaluated the patient during the encounter    Mancel BaleWentz, Kaid Seeberger, MD 02/24/18 [phone]

## 2018-02-23 NOTE — ED Notes (Signed)
Wound care completed. Flushed with sterile water. Thin layer of betadine applied. Wound wrapped with clean gauze. Pt calm at this time, no complaints.

## 2018-02-23 NOTE — Discharge Instructions (Signed)
Please see the information and instructions below regarding your visit.  Your diagnoses today include:  1. Laceration of left thumb without foreign body without damage to nail, initial encounter     Tests performed today include: Vital signs. See below for your results today.   Medications prescribed:   Take any prescribed medications only as directed.  Please take all of your antibiotics until finished.   You may develop abdominal discomfort or nausea from the antibiotic. If this occurs, you may take it with food. Some patients also get diarrhea with antibiotics. You may help offset this with probiotics which you can buy or get in yogurt. Do not eat or take the probiotics until 2 hours after your antibiotic. Some women develop vaginal yeast infections after antibiotics. If you develop unusual vaginal discharge after being on this medication, please see your primary care provider.   Some people develop allergies to antibiotics. Symptoms of antibiotic allergy can be mild and include a flat rash and itching. They can also be more serious and include:  ?Hives - Hives are raised, red patches of skin that are usually very itchy.  ?Lip or tongue swelling  ?Trouble swallowing or breathing  ?Blistering of the skin or mouth.  If you have any of these serious symptoms, please seek emergency medical care immediately.     Home care instructions:  Please perform dressing changes daily.  Please place the yellow gauze to put over the wound, place with a second piece of gauze, and wrapped with the stretchy gauze.  You need to do this until you have a nice scab formation, likely 5 days.  After that, keep it covered for the remainder of healing.  You may apply bacitracin or Neosporin.  Applying sunscreen after fully healed will help prevent scarring.  Follow-up instructions: Please eval and care provider in 2 days to check the wound.  Return instructions:  Return to the Emergency Department if  you have: Fever Worsening pain Worsening swelling of the wound Pus draining from the wound Redness of the skin that moves away from the wound, especially if it streaks away from the affected area  Any other emergent concerns  Your vital signs today were: BP 111/72 (BP Location: Right Arm)    Pulse (!) 59    Temp 98.3 F (36.8 C) (Oral)    Resp 20    Ht 5\' 4"  (1.626 m)    Wt 118.4 kg    SpO2 100%    BMI 44.80 kg/m  If your blood pressure (BP) was elevated on multiple readings during this visit above 130 for the top number or above 80 for the bottom number, please have this repeated by your primary care provider within one month. --------------  Thank you for allowing us to participate in your care today! It was a pleasure taking care of you.

## 2018-02-23 NOTE — ED Triage Notes (Addendum)
Onset last night opening a fruit cocktail can cut her left thumb. Bleeding controlled with bandage prior to arrival. Unsure of last tetanus.

## 2018-02-23 NOTE — ED Provider Notes (Signed)
[place] EMERGENCY DEPARTMENT Provider Note   CSN: [id number] Arrival date & time: 02/23/18  1126     History   Chief Complaint Chief Complaint  Patient presents with  . Laceration    HPI Jocelyn Little is a 67 y.o. female.  HPI   Patient is a 67 year old female with a history of type 2 diabetes mellitus on metformin, GERD, hypercholesterolemia and hypertension presenting for laceration of her left thumb 16 hours ago.  Patient reports that she was opening a fruit cocktail with a metal container when it lacerated the lateral aspect of her left thumb.  Patient denies any numbness or weakness distal to the injury.  Bleeding controlled on the scene.  Tetanus unknown.  No blood thinning medications.  Past Medical History:  Diagnosis Date  . Diabetes mellitus without complication ([place])   . GERD (gastroesophageal reflux disease)   . High cholesterol   . HTN (hypertension)   . OSA (obstructive sleep apnea)   . Rhinitis     Patient Active Problem List   Diagnosis Date Noted  . ASCUS with positive high risk HPV cervical 05/28/2016  . Diabetes mellitus without complication ([place]) 29/93/7169  . Asthma, mild intermittent, well-controlled 01/23/2015  . Meralgia paresthetica of right side 01/29/2014  . Essential hypertension 05/16/2009  . G E R D 05/16/2009  . Obstructive sleep apnea 03/12/2009  . Seasonal and perennial allergic rhinitis 03/12/2009    Past Surgical History:  Procedure Laterality Date  . CHOLECYSTECTOMY    . LIPOMA EXCISION    . TOTAL ABDOMINAL HYSTERECTOMY    . UVULOPALATOPHARYNGOPLASTY       OB History   No obstetric history on file.      Home Medications    Prior to Admission medications   Medication Sig Start Date End Date Taking? Authorizing Provider  albuterol (PROVENTIL HFA;VENTOLIN HFA) 108 (90 Base) MCG/ACT inhaler Inhale 2 puffs into the lungs every 6 (six) hours as needed for wheezing. 08/11/17   Tereasa Coop, PA-C   amLODipine (NORVASC) 5 MG tablet Take 1 tablet (5 mg total) by mouth daily. 08/11/17   Tereasa Coop, PA-C  aspirin 81 MG tablet Take 81 mg by mouth daily.    [provider]  blood glucose meter kit and supplies KIT Dispense based on patient and insurance preference. Use to check cbgs qd - fasting in the morning or 2 hours after a meal, E11.9 10/01/16   Tereasa Coop, PA-C  buPROPion (WELLBUTRIN XL) 150 MG 24 hr tablet Take 1 tablet (150 mg total) by mouth daily. 08/11/17   Tereasa Coop, PA-C  esomeprazole (NEXIUM) 40 MG capsule Take 1 capsule (40 mg total) by mouth as needed (for indigestion). 08/11/17   Tereasa Coop, PA-C  lovastatin (MEVACOR) 20 MG tablet Take 1 tablet (20 mg total) by mouth daily at 6 PM. 08/11/17   Tereasa Coop, PA-C  meloxicam (MOBIC) 7.5 MG tablet Take 1-2 tablets (7.5-15 mg total) by mouth daily. 08/11/17   Tereasa Coop, PA-C  metFORMIN (GLUCOPHAGE) 500 MG tablet Take 1 tablet (500 mg total) by mouth daily with breakfast. 08/11/17   Tereasa Coop, PA-C  metoprolol tartrate (LOPRESSOR) 50 MG tablet Take 1 tablet (50 mg total) by mouth daily. 08/11/17   Tereasa Coop, PA-C  Multiple Vitamin (MULTIVITAMIN) tablet Take 1 tablet by mouth every other day.     [provider]  RESTASIS 0.05 % ophthalmic emulsion Place 2 drops into  both eyes 2 (two) times daily. 11/14/13   [provider]  vortioxetine HBr (TRINTELLIX) 20 MG TABS tablet Take 1 tablet (20 mg total) by mouth daily. 08/11/17   Tereasa Coop, PA-C    Family History Family History  Problem Relation Age of Onset  . Emphysema Father   . Rheum arthritis Father   . Lung cancer Father   . Heart disease Mother   . Breast cancer Mother   . Asthma Brother   . Heart disease Brother   . Rheum arthritis Brother   . Diabetes Brother   . Colon cancer Neg Hx   . Stomach cancer Neg Hx     Social History Social History   Tobacco Use  . Smoking status: Former Smoker     Packs/day: 0.80    Years: 20.00    Pack years: 16.00    Types: Cigarettes    Last attempt to quit: 03/09/1998    Years since quitting: 19.9  . Smokeless tobacco: Never Used  Substance Use Topics  . Alcohol use: No    Alcohol/week: 0.0 standard drinks    Comment: rarely  . Drug use: No     Allergies   Citrus; Dust mite extract; Naproxen; and Other   Review of Systems Review of Systems  Musculoskeletal: Positive for arthralgias.  Skin: Positive for wound.  Neurological: Negative for weakness and numbness.     Physical Exam Updated Vital Signs BP 111/72 (BP Location: Right Arm)   Pulse (!) 59   Temp 98.3 F (36.8 C) (Oral)   Resp 20   Ht 5' 4"  (1.626 m)   Wt 118.4 kg   SpO2 100%   BMI 44.80 kg/m   Physical Exam Vitals signs and nursing note reviewed.  Constitutional:      General: She is not in acute distress.    Appearance: She is well-developed. She is not diaphoretic.     Comments: Sitting comfortably in bed.  HENT:     Head: Normocephalic and atraumatic.  Eyes:     General:        Right eye: No discharge.        Left eye: No discharge.     Conjunctiva/sclera: Conjunctivae normal.     Comments: EOMs normal to gross examination.  Neck:     Musculoskeletal: Normal range of motion.  Cardiovascular:     Rate and Rhythm: Normal rate and regular rhythm.     Comments: Intact, 2+ radial pulse of LUE.  Abdominal:     General: There is no distension.  Musculoskeletal: Normal range of motion.  Skin:    General: Skin is warm and dry.     Comments: Patient exhibits a 1.5 cm superficial laceration to the lateral aspect of the left thumb.  Does not traverse the IP joint.  Bleeding controlled.  No erythema.  No tendon exposure.  Sensation to sharp touch intact distal to the injury.  Neurological:     Mental Status: She is alert.     Comments: Cranial nerves intact to gross observation. Patient moves extremities without difficulty.  Psychiatric:        Behavior:  Behavior normal.        Thought Content: Thought content normal.        Judgment: Judgment normal.        ED Treatments / Results  Labs (all labs ordered are listed, but only abnormal results are displayed) Labs Reviewed - No data to display  EKG None  Radiology No results found.  Procedures Procedures (including critical care time)  Medications Ordered in ED Medications - No data to display   Initial Impression / Assessment and Plan / ED Course  I have reviewed the triage vital signs and the nursing notes.  Pertinent labs & imaging results that were available during my care of the patient were reviewed by me and considered in my medical decision making (see chart for details).     Patient nontoxic-appearing, afebrile, and neurovascularly intact.  Patient presenting 16 hours after a laceration to her left thumb.  It is superficial, and is not amenable to primary closure at this time due to the length from the injury.  No signs of infection currently.  Wound copiously washed out with 500 mL normal saline wound cleanser rinse by nursing staff.  Tetanus updated today.  Patient started on how to perform wet-to-dry dressings.  Patient to follow-up with primary care provider in 48 hours for wound recheck.  Return precautions given for any worsening pain, swelling, redness, or drainage.  Patient covered with Keflex for prophylaxis.  Patient is in understanding and agrees with the plan of care.  This is a shared visit with Dr. Daleen Bo. Patient was independently evaluated by this attending physician. Attending physician consulted in evaluation and discharge management.  Final Clinical Impressions(s) / ED Diagnoses   Final diagnoses:  Laceration of left thumb without foreign body without damage to nail, initial encounter    ED Discharge Orders         Ordered    cephALEXin (KEFLEX) 500 MG capsule  3 times daily     02/23/18 1403           Tamala Julian 02/23/18 1405    Daleen Bo, MD 02/24/18 [phone]

## 2018-08-27 ENCOUNTER — Other Ambulatory Visit: Payer: Self-pay | Admitting: Physician Assistant

## 2018-08-27 DIAGNOSIS — F3342 Major depressive disorder, recurrent, in full remission: Secondary | ICD-10-CM

## 2018-09-07 ENCOUNTER — Other Ambulatory Visit: Payer: Self-pay | Admitting: Physician Assistant

## 2018-09-07 DIAGNOSIS — E119 Type 2 diabetes mellitus without complications: Secondary | ICD-10-CM

## 2018-09-07 DIAGNOSIS — I10 Essential (primary) hypertension: Secondary | ICD-10-CM

## 2018-09-20 LAB — HM DIABETES EYE EXAM

## 2018-10-10 ENCOUNTER — Other Ambulatory Visit: Payer: Self-pay | Admitting: Physician Assistant

## 2018-10-10 DIAGNOSIS — K219 Gastro-esophageal reflux disease without esophagitis: Secondary | ICD-10-CM

## 2018-11-06 ENCOUNTER — Other Ambulatory Visit: Payer: Self-pay | Admitting: Physician Assistant

## 2018-11-06 DIAGNOSIS — R6882 Decreased libido: Secondary | ICD-10-CM

## 2018-11-07 NOTE — Telephone Encounter (Signed)
Requested medication (s) are due for refill today: yes  Requested medication (s) are on the active medication list: yes  Last refill:  08/02/2018 Future visit scheduled: no  Notes to clinic: ordering provider and pcp are not the same    Requested Prescriptions  Pending Prescriptions Disp Refills   buPROPion (WELLBUTRIN XL) 150 MG 24 hr tablet [Pharmacy Med Name: BUPROPION XL 150MG  TABLETS (24 H)] 90 tablet 3    Sig: TAKE 1 TABLET BY MOUTH DAILY     Psychiatry: Antidepressants - bupropion Failed - 11/06/2018  1:22 PM      Failed - Valid encounter within last 6 months    Recent Outpatient Visits          1 year ago Well-controlled hypertension   [facility] at [place], PA-C   1 year ago Chronic pain of right ankle   [facility] at [place], PA-C   1 year ago Bilateral swelling of feet and ankles   [facility] at [place], [place]   1 year ago Well controlled diabetes mellitus [facility])   [facility] at [place], Audrie Lia, PA-C   1 year ago Cystitis   [facility] at [place], PA-C             Failed - Completed PHQ-2 or PHQ-9 in the last 360 days.      Passed - Last BP in normal range    BP Readings from Last 1 Encounters:  02/23/18 111/72

## 2018-12-26 ENCOUNTER — Ambulatory Visit
Admission: RE | Admit: 2018-12-26 | Discharge: 2018-12-26 | Disposition: A | Discharge status: Home / Self Care | Payer: 59 | Source: Ambulatory Visit | Attending: Family | Admitting: Family

## 2018-12-26 ENCOUNTER — Other Ambulatory Visit: Payer: Self-pay | Admitting: Family Medicine

## 2018-12-26 DIAGNOSIS — M549 Dorsalgia, unspecified: Secondary | ICD-10-CM

## 2019-01-07 ENCOUNTER — Encounter (HOSPITAL_COMMUNITY): Payer: Self-pay | Admitting: Emergency Medicine

## 2019-01-07 ENCOUNTER — Emergency Department (HOSPITAL_COMMUNITY)
Admission: EM | Admit: 2019-01-07 | Discharge: 2019-01-07 | Disposition: A | Discharge status: Home / Self Care | Payer: 59 | Attending: Emergency Medicine | Admitting: Emergency Medicine

## 2019-01-07 ENCOUNTER — Other Ambulatory Visit: Payer: Self-pay

## 2019-01-07 DIAGNOSIS — Z7984 Long term (current) use of oral hypoglycemic drugs: Secondary | ICD-10-CM | POA: Diagnosis not present

## 2019-01-07 DIAGNOSIS — Z87891 Personal history of nicotine dependence: Secondary | ICD-10-CM | POA: Insufficient documentation

## 2019-01-07 DIAGNOSIS — Z79899 Other long term (current) drug therapy: Secondary | ICD-10-CM | POA: Diagnosis not present

## 2019-01-07 DIAGNOSIS — N3001 Acute cystitis with hematuria: Secondary | ICD-10-CM | POA: Diagnosis not present

## 2019-01-07 DIAGNOSIS — J45909 Unspecified asthma, uncomplicated: Secondary | ICD-10-CM | POA: Diagnosis not present

## 2019-01-07 DIAGNOSIS — I10 Essential (primary) hypertension: Secondary | ICD-10-CM | POA: Diagnosis not present

## 2019-01-07 DIAGNOSIS — E119 Type 2 diabetes mellitus without complications: Secondary | ICD-10-CM | POA: Insufficient documentation

## 2019-01-07 DIAGNOSIS — R3915 Urgency of urination: Secondary | ICD-10-CM | POA: Diagnosis present

## 2019-01-07 LAB — URINALYSIS, ROUTINE W REFLEX MICROSCOPIC
Glucose, UA: NEGATIVE mg/dL
Ketones, ur: 15 mg/dL — AB
Nitrite: POSITIVE — AB
Protein, ur: 100 mg/dL — AB
Specific Gravity, Urine: 1.025 (ref 1.005–1.030)
pH: 6 (ref 5.0–8.0)

## 2019-01-07 LAB — URINALYSIS, MICROSCOPIC (REFLEX): WBC, UA: >50 WBC/hpf (ref 0–5)

## 2019-01-07 MED ORDER — CEPHALEXIN 500 MG PO CAPS: 500.0000 mg | ORAL_CAPSULE | Freq: Two times a day (BID) | ORAL | 0 refills | Status: AC

## 2019-01-07 MED ORDER — CEPHALEXIN 250 MG PO CAPS
500.0000 mg | ORAL_CAPSULE | Freq: Once | ORAL | Status: AC
  Administered 2019-01-07: 500 mg via ORAL
  Filled 2019-01-07: qty 2

## 2019-01-07 NOTE — ED Provider Notes (Signed)
[facility] EMERGENCY DEPARTMENT Provider Note   CSN: [id number] Arrival date & time: 01/07/19  1213     History   Chief Complaint Chief Complaint  Patient presents with  . Urinary Incontinence    HPI Jocelyn Little is a 68 y.o. female.     HPI  This patient is a 68 year old female, she is a diabetic as well as having high blood pressure and acid reflux.  She reports that for the last 4 days she has had a progressive feeling of urinary urgency, incontinence and dysuria when she passes urine.  She denies fevers or chills and has had no nausea or vomiting.  She does report that when she sits down to urinate she often has a feeling of loose stool or watery stool.  There is no abdominal discomfort, no recent infections, no people at home with similar symptoms.  She has had a bit of a cough and shortness of breath for the last few days but states that the symptoms are mild.  She recalls having a urinary tract infection a couple years ago but nothing in the recent past.  She was up all night going back and forth to the bathroom with incontinence stating that she cannot get to the bathroom in time.  She denies back pain or any numbness or weakness of the legs  Past Medical History:  Diagnosis Date  . Diabetes mellitus without complication ([place])   . GERD (gastroesophageal reflux disease)   . High cholesterol   . HTN (hypertension)   . OSA (obstructive sleep apnea)   . Rhinitis     Patient Active Problem List   Diagnosis Date Noted  . ASCUS with positive high risk HPV cervical 05/28/2016  . Diabetes mellitus without complication ([place]) 56/31/4970  . Asthma, mild intermittent, well-controlled 01/23/2015  . Meralgia paresthetica of right side 01/29/2014  . Essential hypertension 05/16/2009  . G E R D 05/16/2009  . Obstructive sleep apnea 03/12/2009  . Seasonal and perennial allergic rhinitis 03/12/2009    Past Surgical History:  Procedure Laterality Date  .  CHOLECYSTECTOMY    . LIPOMA EXCISION    . TOTAL ABDOMINAL HYSTERECTOMY    . UVULOPALATOPHARYNGOPLASTY       OB History   No obstetric history on file.      Home Medications    Prior to Admission medications   Medication Sig Start Date End Date Taking? Authorizing Provider  albuterol (PROVENTIL HFA;VENTOLIN HFA) 108 (90 Base) MCG/ACT inhaler Inhale 2 puffs into the lungs every 6 (six) hours as needed for wheezing. 08/11/17   Tereasa Coop, PA-C  amLODipine (NORVASC) 5 MG tablet TAKE 1 TABLET BY MOUTH DAILY 09/08/18   Horald Pollen, MD  blood glucose meter kit and supplies KIT Dispense based on patient and insurance preference. Use to check cbgs qd - fasting in the morning or 2 hours after a meal, E11.9 10/01/16   Tereasa Coop, PA-C  buPROPion (WELLBUTRIN XL) 150 MG 24 hr tablet Take 1 tablet (150 mg total) by mouth daily. 08/11/17   Tereasa Coop, PA-C  esomeprazole (NEXIUM) 40 MG capsule Take 1 capsule (40 mg total) by mouth as needed (for indigestion). 08/11/17   Tereasa Coop, PA-C  lovastatin (MEVACOR) 20 MG tablet Take 1 tablet (20 mg total) by mouth at bedtime. 09/08/18 12/07/18  Horald Pollen, MD  meloxicam (MOBIC) 7.5 MG tablet Take 1-2 tablets (7.5-15 mg total) by mouth daily. 08/11/17   Carlis Abbott,  Audrie Lia, PA-C  metFORMIN (GLUCOPHAGE) 500 MG tablet Take 1 tablet (500 mg total) by mouth daily with breakfast. 08/11/17   Tereasa Coop, PA-C  metoprolol tartrate (LOPRESSOR) 50 MG tablet TAKE 1 TABLET BY MOUTH DAILY 09/08/18   Horald Pollen, MD  RESTASIS 0.05 % ophthalmic emulsion Place 2 drops into both eyes 2 (two) times daily. 11/14/13   [provider]  vortioxetine HBr (TRINTELLIX) 20 MG TABS tablet Take 1 tablet (20 mg total) by mouth daily. 08/11/17   Tereasa Coop, PA-C    Family History Family History  Problem Relation Age of Onset  . Emphysema Father   . Rheum arthritis Father   . Lung cancer Father   . Heart disease Mother   . Breast  cancer Mother   . Asthma Brother   . Heart disease Brother   . Rheum arthritis Brother   . Diabetes Brother   . Colon cancer Neg Hx   . Stomach cancer Neg Hx     Social History Social History   Tobacco Use  . Smoking status: Former Smoker    Packs/day: 0.80    Years: 20.00    Pack years: 16.00    Types: Cigarettes    Quit date: 03/09/1998    Years since quitting: 20.8  . Smokeless tobacco: Never Used  Substance Use Topics  . Alcohol use: No    Alcohol/week: 0.0 standard drinks    Comment: rarely  . Drug use: No     Allergies   Citrus, Dust mite extract, Naproxen, and Other   Review of Systems Review of Systems  All other systems reviewed and are negative.    Physical Exam Updated Vital Signs BP 138/64 (BP Location: Right Arm)   Pulse (!) 102   Temp 99.4 F (37.4 C) (Oral)   SpO2 96%   Physical Exam Vitals signs and nursing note reviewed.  Constitutional:      General: She is not in acute distress.    Appearance: She is well-developed.  HENT:     Head: Normocephalic and atraumatic.     Mouth/Throat:     Pharynx: No oropharyngeal exudate.  Eyes:     General: No scleral icterus.       Right eye: No discharge.        Left eye: No discharge.     Conjunctiva/sclera: Conjunctivae normal.     Pupils: Pupils are equal, round, and reactive to light.  Neck:     Musculoskeletal: Normal range of motion and neck supple.     Thyroid: No thyromegaly.     Vascular: No JVD.  Cardiovascular:     Rate and Rhythm: Normal rate and regular rhythm.     Heart sounds: Normal heart sounds. No murmur. No friction rub. No gallop.   Pulmonary:     Effort: Pulmonary effort is normal. No respiratory distress.     Breath sounds: Normal breath sounds. No wheezing or rales.     Comments: Lungs are very clear, there is no wheezing or rales, speaks in full sentences, no distress, oxygen of 98% on room air Abdominal:     General: Bowel sounds are normal. There is no distension.      Palpations: Abdomen is soft. There is no mass.     Tenderness: There is no abdominal tenderness.     Comments: Totally soft and nontender abdomen  Musculoskeletal: Normal range of motion.        General: No tenderness.  Lymphadenopathy:  Cervical: No cervical adenopathy.  Skin:    General: Skin is warm and dry.     Findings: No erythema or rash.  Neurological:     Mental Status: She is alert.     Coordination: Coordination normal.     Comments: No tenderness over the back, able to straight leg raise bilaterally without any difficulty, normal sensation in bilateral lower extremities  Psychiatric:        Behavior: Behavior normal.      ED Treatments / Results  Labs (all labs ordered are listed, but only abnormal results are displayed) Labs Reviewed  URINALYSIS, ROUTINE W REFLEX MICROSCOPIC    EKG None  Radiology No results found.  Procedures Procedures (including critical care time)  Medications Ordered in ED Medications - No data to display   Initial Impression / Assessment and Plan / ED Course  I have reviewed the triage vital signs and the nursing notes.  Pertinent labs & imaging results that were available during my care of the patient were reviewed by me and considered in my medical decision making (see chart for details).  Clinical Course as of Jan 06 1437  Sat Jan 07, 2019  1337 UA consistent with UTI - antibiotics given Pt agreeable to d/c - has culture pending.   [BM]    Clinical Course User Index [BM] Noemi Chapel, MD       Rule out urinary tract infection, urinalysis with a culture, patient agreeable, low risk for back pain causing her symptoms, this does not seem like cauda equina, small amount of diarrhea but no nausea or vomiting fevers or chills or weight loss.  UTI present, culture pending, pt and spouse updated Cephalexin given, pt pleased with answers and agreeable to d/c.  Final Clinical Impressions(s) / ED Diagnoses   Final  diagnoses:  Acute cystitis with hematuria    ED Discharge Orders         Ordered    cephALEXin (KEFLEX) 500 MG capsule  2 times daily     01/07/19 1338           Noemi Chapel, MD 01/07/19 1439

## 2019-01-07 NOTE — ED Notes (Signed)
Walked patient to the bathroom patient did well now in bed on the monitor with call bell in reach

## 2019-01-07 NOTE — ED Triage Notes (Signed)
C/o urinary incontinence and burning with urination since Wednesday.

## 2019-01-07 NOTE — Discharge Instructions (Signed)
Please follow-up with your doctor on Monday to your urine culture.  The urinalysis which was looked at under the microscope showed lots of signs of infection thus we have started antibiotics.  Seek medical exam for increasing pain fever or vomiting, otherwise take Keflex twice daily for 7 days

## 2019-01-09 LAB — URINE CULTURE: Culture: >=100000 — AB

## 2019-01-10 ENCOUNTER — Telehealth: Payer: Self-pay | Admitting: Emergency Medicine

## 2019-01-10 NOTE — Telephone Encounter (Signed)
Post ED Visit - Positive Culture Follow-up  Culture report reviewed by antimicrobial stewardship pharmacist: [facility] Team []  Elenor Quinones, Pharm.D. []  Heide Guile, Pharm.D., BCPS AQ-ID []  Parks Neptune, Pharm.D., BCPS []  Alycia Rossetti, Pharm.D., BCPS []  Singer, Pharm.D., BCPS, AAHIVP []  Legrand Como, Pharm.D., BCPS, AAHIVP []  Salome Arnt, PharmD, BCPS []  Johnnette Gourd, PharmD, BCPS []  Hughes Better, PharmD, BCPS []  Leeroy Cha, PharmD []  Laqueta Linden, PharmD, BCPS []  Albertina Parr, PharmD Gerre Pebbles PharmD  [facility] Team []  Leodis Sias, PharmD []  Lindell Spar, PharmD []  Royetta Asal, PharmD []  Graylin Shiver, Rph []  Rema Fendt) Glennon Mac, PharmD []  Arlyn Dunning, PharmD []  Netta Cedars, PharmD []  Dia Sitter, PharmD []  Leone Haven, PharmD []  Gretta Arab, PharmD []  Theodis Shove, PharmD []  Peggyann Juba, PharmD []  Reuel Boom, PharmD   Positive urine culture Treated with cephalexin, organism sensitive to the same and no further patient follow-up is required at this time.  Hazle Nordmann 01/10/2019, 9:52 AM

## 2019-04-22 ENCOUNTER — Ambulatory Visit: Payer: 59

## 2019-04-30 ENCOUNTER — Ambulatory Visit: Payer: 59

## 2019-04-30 ENCOUNTER — Ambulatory Visit: Payer: 59 | Attending: Internal Medicine

## 2019-04-30 DIAGNOSIS — Z23 Encounter for immunization: Secondary | ICD-10-CM | POA: Insufficient documentation

## 2019-04-30 NOTE — Progress Notes (Signed)
   Covid-19 Vaccination Clinic  Name:  WYNN KERNES    MRN: [id number] DOB: [birth year]  04/30/2019  Ms. Apollo was observed post Covid-19 immunization for 15 minutes without incidence. She was provided with Vaccine Information Sheet and instruction to access the V-Safe system.   Ms. Ellington was instructed to call 911 with any severe reactions post vaccine: Marland Kitchen Difficulty breathing  . Swelling of your face and throat  . A fast heartbeat  . A bad rash all over your body  . Dizziness and weakness    Immunizations Administered    Name Date Dose VIS Date Route   Pfizer COVID-19 Vaccine 04/30/2019  1:36 PM 0.3 mL 02/17/2019 Intramuscular   Manufacturer: [employer], [employer]   Lot: [id number]   NDC: [address]-2

## 2019-05-24 ENCOUNTER — Ambulatory Visit: Payer: 59 | Attending: Internal Medicine

## 2019-05-24 DIAGNOSIS — Z23 Encounter for immunization: Secondary | ICD-10-CM

## 2019-05-24 NOTE — Progress Notes (Signed)
   Covid-19 Vaccination Clinic  Name:  Jocelyn Little    MRN: [id number] DOB: [birth year]  05/24/2019  [name] was observed post Covid-19 immunization for 15 minutes without incident. She was provided with Vaccine Information Sheet and instruction to access the V-Safe system.   [name] was instructed to call 911 with any severe reactions post vaccine: Marland Kitchen Difficulty breathing  . Swelling of face and throat  . A fast heartbeat  . A bad rash all over body  . Dizziness and weakness   Immunizations Administered    Name Date Dose VIS Date Route   Pfizer COVID-19 Vaccine 05/24/2019  1:38 PM 0.3 mL 02/17/2019 Intramuscular   Manufacturer: [employer], [employer]   Lot: [id number]   NDC: [address]-8

## 2019-06-13 ENCOUNTER — Telehealth: Payer: Self-pay | Admitting: *Deleted

## 2019-06-13 NOTE — Telephone Encounter (Signed)
Spoke to patient to let her know she needs to schedule appointment for refill of Lovastatin and labs. The patient's last OV was with Deliah Boston PA-C on 08/11/2017. The patient stated she does not come to this office anymore she goes to [facility] Baruch Goldmann APN). The Rx request was faxed to [facility] on [facility] letting them know provider not at this practice.

## 2019-07-14 ENCOUNTER — Ambulatory Visit: Payer: 59 | Admitting: Podiatry

## 2019-09-20 ENCOUNTER — Ambulatory Visit: Payer: 59 | Admitting: Podiatry

## 2019-10-13 ENCOUNTER — Encounter: Payer: Self-pay | Admitting: Podiatry

## 2019-10-13 ENCOUNTER — Ambulatory Visit (INDEPENDENT_AMBULATORY_CARE_PROVIDER_SITE_OTHER): Payer: 59 | Admitting: Podiatry

## 2019-10-13 ENCOUNTER — Other Ambulatory Visit: Payer: Self-pay

## 2019-10-13 DIAGNOSIS — L600 Ingrowing nail: Secondary | ICD-10-CM

## 2019-10-13 DIAGNOSIS — M79674 Pain in right toe(s): Secondary | ICD-10-CM

## 2019-10-13 NOTE — Progress Notes (Signed)
Subjective:  Patient ID: Jocelyn Little, female    DOB: 12/12/1950,  MRN: [id number]  Chief Complaint  Patient presents with  . Ingrown Toenail    pt is here for a possible ingrown of the right big toenail lateral side, pt states that the nail is elevated to the touch. Pt states that it has been off and on for a couple of years.    69 y.o. female presents with the above complaint.  Patient presents with complaint of right hallux lateral ingrown.  Patient states is been painful to touch.  Patient states that has been going on and off for couple years progressive gotten worse.  There has been some oozing and pus seen but not recently.  Pain is elevated when applying pressure.  She would like to have it removed.  She denies any other acute complaints.   Review of Systems: Negative except as noted in the HPI. Denies N/V/F/Ch.  Past Medical History:  Diagnosis Date  . Diabetes mellitus without complication ([place])   . GERD (gastroesophageal reflux disease)   . High cholesterol   . HTN (hypertension)   . OSA (obstructive sleep apnea)   . Rhinitis     Current Outpatient Medications:  .  albuterol (PROVENTIL HFA;VENTOLIN HFA) 108 (90 Base) MCG/ACT inhaler, Inhale 2 puffs into the lungs every 6 (six) hours as needed for wheezing., Disp: 1 Inhaler, Rfl: 11 .  amLODipine (NORVASC) 5 MG tablet, TAKE 1 TABLET BY MOUTH DAILY, Disp: 90 tablet, Rfl: 3 .  blood glucose meter kit and supplies KIT, Dispense based on patient and insurance preference. Use to check cbgs qd - fasting in the morning or 2 hours after a meal, E11.9, Disp: 1 each, Rfl: 0 .  buPROPion (WELLBUTRIN XL) 150 MG 24 hr tablet, Take 1 tablet (150 mg total) by mouth daily., Disp: 90 tablet, Rfl: 3 .  esomeprazole (NEXIUM) 40 MG capsule, Take 1 capsule (40 mg total) by mouth as needed (for indigestion)., Disp: 30 capsule, Rfl: 11 .  meloxicam (MOBIC) 7.5 MG tablet, Take 1-2 tablets (7.5-15 mg total) by mouth daily., Disp: 30 tablet,  Rfl: 5 .  metFORMIN (GLUCOPHAGE) 500 MG tablet, Take 1 tablet (500 mg total) by mouth daily with breakfast., Disp: 90 tablet, Rfl: 3 .  metoprolol tartrate (LOPRESSOR) 50 MG tablet, TAKE 1 TABLET BY MOUTH DAILY, Disp: 90 tablet, Rfl: 3 .  RESTASIS 0.05 % ophthalmic emulsion, Place 2 drops into both eyes 2 (two) times daily., Disp: , Rfl: 0 .  vortioxetine HBr (TRINTELLIX) 20 MG TABS tablet, Take 1 tablet (20 mg total) by mouth daily., Disp: 90 tablet, Rfl: 3 .  lovastatin (MEVACOR) 20 MG tablet, Take 1 tablet (20 mg total) by mouth at bedtime., Disp: 90 tablet, Rfl: 0  Current Facility-Administered Medications:  .  0.9 %  sodium chloride infusion, 500 mL, Intravenous, Continuous, Milus Banister, MD  Social History   Tobacco Use  Smoking Status Former Smoker  . Packs/day: 0.80  . Years: 20.00  . Pack years: 16.00  . Types: Cigarettes  . Quit date: 03/09/1998  . Years since quitting: 21.6  Smokeless Tobacco Never Used    Allergies  Allergen Reactions  . Citrus Itching  . Dust Mite Extract Other (See Comments)    Sneezing, Runny nose   . Naproxen     Itching after taking last night   . Other Itching    Acidic foods   Objective:  There were no vitals filed for this  visit. There is no height or weight on file to calculate BMI. Constitutional Well developed. Well nourished.  Vascular Dorsalis pedis pulses palpable bilaterally. Posterior tibial pulses palpable bilaterally. Capillary refill normal to all digits.  No cyanosis or clubbing noted. Pedal hair growth normal.  Neurologic Normal speech. Oriented to person, place, and time. Epicritic sensation to light touch grossly present bilaterally.  Dermatologic Painful ingrowing nail at lateral nail borders of the hallux nail right. No other open wounds. No skin lesions.  Orthopedic: Normal joint ROM without pain or crepitus bilaterally. No visible deformities. No bony tenderness.   Radiographs: None Assessment:   1.  Ingrown toenail of right foot   2. Great toe pain, right    Plan:  Patient was evaluated and treated and all questions answered.  Ingrown Nail, right -Patient elects to proceed with minor surgery to remove ingrown toenail removal today. Consent reviewed and signed by patient. -Ingrown nail excised. See procedure note. -Educated on post-procedure care including soaking. Written instructions provided and reviewed. -Patient to follow up in 2 weeks for nail check.  Procedure: Excision of Ingrown Toenail Location: Right 1st toe lateral nail borders. Anesthesia: Lidocaine 1% plain; 1.5 mL and Marcaine 0.5% plain; 1.5 mL, digital block. Skin Prep: Betadine. Dressing: Silvadene; telfa; dry, sterile, compression dressing. Technique: Following skin prep, the toe was exsanguinated and a tourniquet was secured at the base of the toe. The affected nail border was freed, split with a nail splitter, and excised. Chemical matrixectomy was then performed with phenol and irrigated out with alcohol. The tourniquet was then removed and sterile dressing applied. Disposition: Patient tolerated procedure well. Patient to return in 2 weeks for follow-up.   No follow-ups on file.

## 2019-11-27 ENCOUNTER — Encounter (HOSPITAL_COMMUNITY): Payer: Self-pay

## 2019-11-27 ENCOUNTER — Other Ambulatory Visit: Payer: Self-pay

## 2019-11-27 ENCOUNTER — Ambulatory Visit (HOSPITAL_COMMUNITY)
Admission: EM | Admit: 2019-11-27 | Discharge: 2019-11-27 | Disposition: A | Discharge status: Home / Self Care | Payer: 59 | Attending: Physician Assistant | Admitting: Physician Assistant

## 2019-11-27 DIAGNOSIS — N3001 Acute cystitis with hematuria: Secondary | ICD-10-CM | POA: Insufficient documentation

## 2019-11-27 DIAGNOSIS — N390 Urinary tract infection, site not specified: Secondary | ICD-10-CM

## 2019-11-27 LAB — POCT URINALYSIS DIPSTICK, ED / UC
Bilirubin Urine: NEGATIVE
Glucose, UA: NEGATIVE mg/dL
Ketones, ur: NEGATIVE mg/dL
Nitrite: NEGATIVE
Protein, ur: NEGATIVE mg/dL
Specific Gravity, Urine: 1.025 (ref 1.005–1.030)
Urobilinogen, UA: 0.2 mg/dL (ref 0.0–1.0)
pH: 5.5 (ref 5.0–8.0)

## 2019-11-27 MED ORDER — NITROFURANTOIN MONOHYD MACRO 100 MG PO CAPS: 100.0000 mg | ORAL_CAPSULE | Freq: Two times a day (BID) | ORAL | 0 refills | Status: DC

## 2019-11-27 NOTE — ED Provider Notes (Signed)
[facility]    CSN: [id number] Arrival date & time: 11/27/19  1750      History   Chief Complaint Chief Complaint  Patient presents with  . Urinary Tract Infection    HPI Jocelyn Little is a 69 y.o. female.   HPI   Patient presents for evaluation of dysuria.  Patient reports symptoms have been present on and off over the last few weeks.  Patient also has a history of lower bladder pressure and incontinence related to stress incontinence.  She is in the process of obtaining a referral to see a urologist.  Denies any fever or back pain mild flank pain is present.  Pain is present in the lower abdomen with urination.  No nausea, vomiting, diarrhea or history of recurrent pyelonephritis.  Past Medical History:  Diagnosis Date  . Diabetes mellitus without complication ([place])   . GERD (gastroesophageal reflux disease)   . High cholesterol   . HTN (hypertension)   . OSA (obstructive sleep apnea)   . Rhinitis     Patient Active Problem List   Diagnosis Date Noted  . ASCUS with positive high risk HPV cervical 05/28/2016  . Diabetes mellitus without complication ([place]) 95/74/7340  . Asthma, mild intermittent, well-controlled 01/23/2015  . Meralgia paresthetica of right side 01/29/2014  . Essential hypertension 05/16/2009  . G E R D 05/16/2009  . Obstructive sleep apnea 03/12/2009  . Seasonal and perennial allergic rhinitis 03/12/2009    Past Surgical History:  Procedure Laterality Date  . CHOLECYSTECTOMY    . LIPOMA EXCISION    . TOTAL ABDOMINAL HYSTERECTOMY    . UVULOPALATOPHARYNGOPLASTY      OB History   No obstetric history on file.      Home Medications    Prior to Admission medications   Medication Sig Start Date End Date Taking? Authorizing Provider  albuterol (PROVENTIL HFA;VENTOLIN HFA) 108 (90 Base) MCG/ACT inhaler Inhale 2 puffs into the lungs every 6 (six) hours as needed for wheezing. 08/11/17   Tereasa Coop, PA-C  amLODipine (NORVASC)  5 MG tablet TAKE 1 TABLET BY MOUTH DAILY 09/08/18   Horald Pollen, MD  blood glucose meter kit and supplies KIT Dispense based on patient and insurance preference. Use to check cbgs qd - fasting in the morning or 2 hours after a meal, E11.9 10/01/16   Tereasa Coop, PA-C  buPROPion (WELLBUTRIN XL) 150 MG 24 hr tablet Take 1 tablet (150 mg total) by mouth daily. 08/11/17   Tereasa Coop, PA-C  esomeprazole (NEXIUM) 40 MG capsule Take 1 capsule (40 mg total) by mouth as needed (for indigestion). 08/11/17   Tereasa Coop, PA-C  lovastatin (MEVACOR) 20 MG tablet Take 1 tablet (20 mg total) by mouth at bedtime. 09/08/18 12/07/18  Horald Pollen, MD  meloxicam (MOBIC) 7.5 MG tablet Take 1-2 tablets (7.5-15 mg total) by mouth daily. 08/11/17   Tereasa Coop, PA-C  metFORMIN (GLUCOPHAGE) 500 MG tablet Take 1 tablet (500 mg total) by mouth daily with breakfast. 08/11/17   Tereasa Coop, PA-C  metoprolol tartrate (LOPRESSOR) 50 MG tablet TAKE 1 TABLET BY MOUTH DAILY 09/08/18   Horald Pollen, MD  RESTASIS 0.05 % ophthalmic emulsion Place 2 drops into both eyes 2 (two) times daily. 11/14/13   [provider]  vortioxetine HBr (TRINTELLIX) 20 MG TABS tablet Take 1 tablet (20 mg total) by mouth daily. 08/11/17   [place]    Family History  Family History  Problem Relation Age of Onset  . Emphysema Father   . Rheum arthritis Father   . Lung cancer Father   . Heart disease Mother   . Breast cancer Mother   . Asthma Brother   . Heart disease Brother   . Rheum arthritis Brother   . Diabetes Brother   . Colon cancer Neg Hx   . Stomach cancer Neg Hx     Social History Social History   Tobacco Use  . Smoking status: Former Smoker    Packs/day: 0.80    Years: 20.00    Pack years: 16.00    Types: Cigarettes    Quit date: 03/09/1998    Years since quitting: 21.7  . Smokeless tobacco: Never Used  Substance Use Topics  . Alcohol use: Not Currently     Alcohol/week: 0.0 standard drinks    Comment: rarely  . Drug use: No     Allergies   Citrus, Dust mite extract, Naproxen, and Other   Review of Systems Review of Systems Pertinent negatives listed in HPI  Physical Exam Triage Vital Signs ED Triage Vitals  Enc Vitals Group     BP 11/27/19 1916 (!) 160/69     Pulse Rate 11/27/19 1916 72     Resp 11/27/19 1916 16     Temp 11/27/19 1916 98.5 F (36.9 C)     Temp Source 11/27/19 1916 Oral     SpO2 11/27/19 1916 100 %     Weight 11/27/19 1920 260 lb (117.9 kg)     Height 11/27/19 1920 5' 3"  (1.6 m)     Head Circumference --      Peak Flow --      Pain Score 11/27/19 1920 0     Pain Loc --      Pain Edu? --      Excl. in [place]? --    No data found.  Updated Vital Signs BP (!) 160/69   Pulse 72   Temp 98.5 F (36.9 C) (Oral)   Resp 16   Ht 5' 3"  (1.6 m)   Wt 260 lb (117.9 kg)   SpO2 100%   BMI 46.06 kg/m   Visual Acuity Right Eye Distance:   Left Eye Distance:   Bilateral Distance:    Right Eye Near:   Left Eye Near:    Bilateral Near:     Physical Exam   UC Treatments / Results  Labs (all labs ordered are listed, but only abnormal results are displayed) Labs Reviewed  POCT URINALYSIS DIPSTICK, ED / UC - Abnormal; Notable for the following components:      Result Value   Hgb urine dipstick TRACE (*)    Leukocytes,Ua SMALL (*)    All other components within normal limits    EKG   Radiology No results found.  Procedures Procedures (including critical care time)  Medications Ordered in UC Medications - No data to display  Initial Impression / Assessment and Plan / UC Course  I have reviewed the triage vital signs and the nursing notes.  Pertinent labs & imaging results that were available during my care of the patient were reviewed by me and considered in my medical decision making (see chart for details).    UA significant for leuks however no nitrates.  Will treat for an acute urinary tract  infection.  Urine culture ordered and pending.  Advised patient of the recommendation to follow-up with a urologist given recurrent incontinent episodes.  Red flags discussed if fever, abdominal pain, gross hematuria develop immediately to the emergency department. Final Clinical Impressions(s) / UC Diagnoses   Final diagnoses:  Acute cystitis with hematuria   Discharge Instructions   None    ED Prescriptions    Medication Sig Dispense Auth. Provider   nitrofurantoin, macrocrystal-monohydrate, (MACROBID) 100 MG capsule Take 1 capsule (100 mg total) by mouth 2 (two) times daily. 20 capsule Scot Jun, FNP     PDMP not reviewed this encounter.   Scot Jun, FNP 12/01/19 1504

## 2019-11-27 NOTE — ED Triage Notes (Signed)
Pt c/o dysuria, burning in stomach, "never feel like she finishes urinating," urinary urgency, pain in left lower backx3 mos.

## 2019-11-28 LAB — URINE CULTURE

## 2019-12-15 ENCOUNTER — Ambulatory Visit (INDEPENDENT_AMBULATORY_CARE_PROVIDER_SITE_OTHER): Payer: 59 | Admitting: Primary Care

## 2020-01-11 ENCOUNTER — Ambulatory Visit (INDEPENDENT_AMBULATORY_CARE_PROVIDER_SITE_OTHER): Payer: 59 | Admitting: Obstetrics and Gynecology

## 2020-01-11 ENCOUNTER — Encounter: Payer: Self-pay | Admitting: Obstetrics and Gynecology

## 2020-01-11 ENCOUNTER — Other Ambulatory Visit: Payer: Self-pay

## 2020-01-11 VITALS — BP 134/80 | Ht 65.0 in | Wt 265.0 lb

## 2020-01-11 DIAGNOSIS — N3946 Mixed incontinence: Secondary | ICD-10-CM

## 2020-01-11 NOTE — Progress Notes (Signed)
   Jocelyn Little 1950/08/26 [id number]  69 y.o. G2P0020 female with history of diabetes mellitus type 2 with chronic kidney disease and BMI 44 presents as a referral from her primary physician Dr. Dorinda Hill at [facility] for management of a mixed urinary incontinence clinical picture.  The patient notes leakage of urine with cough, sneeze, laugh.  She also has been having urinary urgency and has already tried management with oxybutynin and currently has been on Myrbetriq instead, recently raising her dose from 25 to 50 mg daily with minimal relief of her symptoms.  She has had treatment of a UTI with Macrobid per her PCP.  She had a prior hysterectomy in 1996.  I explained to the patient that as we are gynecology office and do not offer any in-depth urologic work-up and she has already failed basic treatments for urinary incontinence, I suggested that the next best step in her management would be referral to urogynecology.  I will have my staff reach out to the patient to assist with referral to Dr. Florian Buff.  No charge for today's encounter.   Theresia Majors MD 01/11/20

## 2020-01-12 ENCOUNTER — Telehealth: Payer: Self-pay | Admitting: *Deleted

## 2020-01-12 DIAGNOSIS — N3946 Mixed incontinence: Secondary | ICD-10-CM

## 2020-01-12 NOTE — Telephone Encounter (Signed)
Referral placed in epic for Dr.Schroder office they will to schedule.

## 2020-01-12 NOTE — Telephone Encounter (Signed)
-----   Message from Theresia Majors, MD sent at 01/11/2020  3:39 PM EDT ----- Regarding: urogyn/urology referral Hi, this patient has mixed urinary incontinence symptoms and already has failed conservative management so I would like to refer her to urology or urogyn/Dr. Florian Buff for further workup and management. Thanks!
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
## 2020-01-24 ENCOUNTER — Encounter: Payer: Self-pay | Admitting: Obstetrics and Gynecology

## 2020-01-24 ENCOUNTER — Other Ambulatory Visit: Payer: Self-pay

## 2020-01-24 ENCOUNTER — Ambulatory Visit (INDEPENDENT_AMBULATORY_CARE_PROVIDER_SITE_OTHER): Payer: 59 | Admitting: Obstetrics and Gynecology

## 2020-01-24 VITALS — BP 148/80 | Wt 265.5 lb

## 2020-01-24 DIAGNOSIS — N390 Urinary tract infection, site not specified: Secondary | ICD-10-CM | POA: Diagnosis not present

## 2020-01-24 DIAGNOSIS — N3281 Overactive bladder: Secondary | ICD-10-CM | POA: Diagnosis not present

## 2020-01-24 DIAGNOSIS — R3915 Urgency of urination: Secondary | ICD-10-CM | POA: Diagnosis not present

## 2020-01-24 LAB — POCT URINALYSIS DIPSTICK
Appearance: NORMAL
Bilirubin, UA: NEGATIVE
Blood, UA: NEGATIVE
Glucose, UA: NEGATIVE
Ketones, UA: NEGATIVE
Leukocytes, UA: NEGATIVE
Nitrite, UA: NEGATIVE
Odor: NEGATIVE
Protein, UA: POSITIVE — AB
Spec Grav, UA: 1.01 (ref 1.010–1.025)
Urobilinogen, UA: 0.2 E.U./dL
pH, UA: 8.5 — AB (ref 5.0–8.0)

## 2020-01-24 MED ORDER — ESTRADIOL 0.1 MG/GM VA CREA: 0.5000 g | TOPICAL_CREAM | VAGINAL | 11 refills | Status: DC

## 2020-01-24 NOTE — Patient Instructions (Addendum)
Today we talked about ways to manage bladder urgency such as altering your diet to avoid irritative beverages and foods (bladder diet) as well as attempting to decrease stress and other exacerbating factors.    The Most Bothersome Foods* The Least Bothersome Foods*  Coffee - Regular & Decaf Tea - caffeinated Carbonated beverages - cola, non-colas, diet & caffeine-free Alcohols - Beer, Red Wine, White Wine, [address] - Grapefruit, [place], Orange, [employer] - Cranberry, Grapefruit, Orange, Pineapple Vegetables - Tomato & Tomato Products Flavor Enhancers - Hot peppers, Spicy foods, Chili, Horseradish, Vinegar, Monosodium glutamate (MSG) Artificial Sweeteners - NutraSweet, Sweet 'N Low, Equal (sweetener), Saccharin Ethnic foods - [place], [place], [place] food [employer] - low-fat & whole Fruits - Bananas, Blueberries, Honeydew melon, Pears, Raisins, Watermelon Vegetables - Broccoli, [address] Sprouts, [place], Carrots, Cauliflower, [place], Cucumber, Mushrooms, Peas, Radishes, Squash, Zucchini, White potatoes, Sweet potatoes & yams Poultry - Chicken, Eggs, [place], [employer] - Beef, Diplomatic Services operational officer, Lamb Seafood - Shrimp, [place] fish, Salmon Grains - Oat, Rice Snacks - Pretzels, Popcorn  *Lenward Chancellor et al. Diet and its role in interstitial cystitis/bladder pain syndrome (IC/BPS) and comorbid conditions. [employer]. BJU Int. 2012 Jan 11.     Complete a bladder diary for 3 days prior to starting treatment for PTNS.  PTNS: - Overactive bladder (OAB) causes bladder urgency, frequency, and having to void at night with or without leakage.  Several  treatment options exist, including behavioral changes (avoiding caffeine, etc), physical therapy, medications, and neuromodulation (ways to change the nerve signals to the bladder).   We have planned to start Posterior Tibial Nerve Stimulation (PTNS) for treatment of your OAB. PTNS treatment involves electrical signals from an acupuncture like  needle placed in the ankle for one 45 minute session a week for 6-12 weeks.     **Please ensure you have received insurance coverage/clearance prior to starting your treatment sessions.

## 2020-02-05 NOTE — Telephone Encounter (Signed)
Patient was seen on 01/24/20

## 2020-02-12 ENCOUNTER — Ambulatory Visit: Payer: 59 | Attending: Internal Medicine

## 2020-02-12 DIAGNOSIS — Z23 Encounter for immunization: Secondary | ICD-10-CM

## 2020-02-12 NOTE — Progress Notes (Signed)
   Covid-19 Vaccination Clinic  Name:  Jocelyn Little    MRN: [id number] DOB: 05-16-1950  02/12/2020  [name] was observed post Covid-19 immunization for 15 minutes without incident. She was provided with Vaccine Information Sheet and instruction to access the V-Safe system.   [name] was instructed to call 911 with any severe reactions post vaccine: Marland Kitchen Difficulty breathing  . Swelling of face and throat  . A fast heartbeat  . A bad rash all over body  . Dizziness and weakness   Immunizations Administered    Name Date Dose VIS Date Route   Pfizer COVID-19 Vaccine 02/12/2020  3:33 PM 0.3 mL 12/27/2019 Intramuscular   Manufacturer: [employer], [employer]   Lot: [id number]   NDC: [address]-9

## 2020-03-26 ENCOUNTER — Telehealth: Payer: Self-pay | Admitting: *Deleted

## 2020-03-26 NOTE — Telephone Encounter (Signed)
I called pt to inform of benefit coverage for PTNS to be done here in the office.  Pt states that she is going to have to wait on this procedure at the time. She states that she has too much going on right now to go through with this.  Informed pt that when she decides to set up appointments that her coinsurance is 20% which is around $90 each visit.  Advised to call back when she is ready to schedule. Pt verbalized understanding.

## 2020-03-30 ENCOUNTER — Emergency Department (HOSPITAL_COMMUNITY): Payer: 59

## 2020-03-30 ENCOUNTER — Encounter (HOSPITAL_COMMUNITY): Payer: Self-pay | Admitting: Emergency Medicine

## 2020-03-30 ENCOUNTER — Other Ambulatory Visit: Payer: Self-pay

## 2020-03-30 ENCOUNTER — Ambulatory Visit (HOSPITAL_COMMUNITY)
Admission: EM | Admit: 2020-03-30 | Discharge: 2020-03-30 | Disposition: A | Discharge status: Other Acute Inpatient Hospital | Payer: 59 | Attending: Physician Assistant | Admitting: Physician Assistant

## 2020-03-30 ENCOUNTER — Ambulatory Visit (INDEPENDENT_AMBULATORY_CARE_PROVIDER_SITE_OTHER): Payer: 59

## 2020-03-30 ENCOUNTER — Emergency Department (HOSPITAL_COMMUNITY)
Admission: EM | Admit: 2020-03-30 | Discharge: 2020-03-31 | Disposition: A | Discharge status: Home / Self Care | Payer: 59 | Attending: Emergency Medicine | Admitting: Emergency Medicine

## 2020-03-30 DIAGNOSIS — Z7984 Long term (current) use of oral hypoglycemic drugs: Secondary | ICD-10-CM | POA: Insufficient documentation

## 2020-03-30 DIAGNOSIS — M25551 Pain in right hip: Secondary | ICD-10-CM | POA: Diagnosis present

## 2020-03-30 DIAGNOSIS — Z79899 Other long term (current) drug therapy: Secondary | ICD-10-CM | POA: Diagnosis not present

## 2020-03-30 DIAGNOSIS — Y9301 Activity, walking, marching and hiking: Secondary | ICD-10-CM | POA: Insufficient documentation

## 2020-03-30 DIAGNOSIS — Z87891 Personal history of nicotine dependence: Secondary | ICD-10-CM | POA: Diagnosis not present

## 2020-03-30 DIAGNOSIS — E119 Type 2 diabetes mellitus without complications: Secondary | ICD-10-CM | POA: Insufficient documentation

## 2020-03-30 DIAGNOSIS — Y92009 Unspecified place in unspecified non-institutional (private) residence as the place of occurrence of the external cause: Secondary | ICD-10-CM | POA: Insufficient documentation

## 2020-03-30 DIAGNOSIS — I10 Essential (primary) hypertension: Secondary | ICD-10-CM | POA: Insufficient documentation

## 2020-03-30 DIAGNOSIS — W19XXXA Unspecified fall, initial encounter: Secondary | ICD-10-CM

## 2020-03-30 DIAGNOSIS — J4520 Mild intermittent asthma, uncomplicated: Secondary | ICD-10-CM | POA: Diagnosis not present

## 2020-03-30 MED ORDER — HYDROCODONE-ACETAMINOPHEN 5-325 MG PO TABS
ORAL_TABLET | ORAL | Status: AC
  Filled 2020-03-30: qty 1

## 2020-03-30 MED ORDER — HYDROCODONE-ACETAMINOPHEN 5-325 MG PO TABS
1.0000 | ORAL_TABLET | Freq: Once | ORAL | Status: AC
  Administered 2020-03-30: 1 via ORAL

## 2020-03-30 NOTE — ED Provider Notes (Addendum)
[facility]    CSN: [id number] Arrival date & time: 03/30/20  1722      History   Chief Complaint Chief Complaint  Patient presents with  . Hip Pain    Radiates into rt groin    HPI Jocelyn Little is a 70 y.o. female.   The history is provided by the patient. No language interpreter was used.  Hip Pain This is a new problem. The problem occurs constantly. The problem has not changed since onset.Nothing aggravates the symptoms. Nothing relieves the symptoms.    Past Medical History:  Diagnosis Date  . Diabetes mellitus without complication ([place])   . GERD (gastroesophageal reflux disease)   . High cholesterol   . HTN (hypertension)   . OSA (obstructive sleep apnea)   . Rhinitis   . Urinary incontinence     Patient Active Problem List   Diagnosis Date Noted  . ASCUS with positive high risk HPV cervical 05/28/2016  . Diabetes mellitus without complication ([place]) 29/79/8921  . Asthma, mild intermittent, well-controlled 01/23/2015  . Meralgia paresthetica of right side 01/29/2014  . Essential hypertension 05/16/2009  . G E R D 05/16/2009  . Obstructive sleep apnea 03/12/2009  . Seasonal and perennial allergic rhinitis 03/12/2009    Past Surgical History:  Procedure Laterality Date  . CHOLECYSTECTOMY    . LIPOMA EXCISION    . TOTAL ABDOMINAL HYSTERECTOMY    . UVULOPALATOPHARYNGOPLASTY      OB History    Gravida  2   Para      Term      Preterm      AB  2   Living        SAB  2   IAB      Ectopic      Multiple      Live Births               Home Medications    Prior to Admission medications   Medication Sig Start Date End Date Taking? Authorizing Provider  albuterol (PROVENTIL HFA;VENTOLIN HFA) 108 (90 Base) MCG/ACT inhaler Inhale 2 puffs into the lungs every 6 (six) hours as needed for wheezing. 08/11/17   Tereasa Coop, PA-C  amLODipine (NORVASC) 5 MG tablet TAKE 1 TABLET BY MOUTH DAILY 09/08/18   Horald Pollen, MD  blood glucose meter kit and supplies KIT Dispense based on patient and insurance preference. Use to check cbgs qd - fasting in the morning or 2 hours after a meal, E11.9 10/01/16   Tereasa Coop, PA-C  buPROPion (WELLBUTRIN XL) 150 MG 24 hr tablet Take 1 tablet (150 mg total) by mouth daily. 08/11/17   Tereasa Coop, PA-C  esomeprazole (NEXIUM) 40 MG capsule Take 1 capsule (40 mg total) by mouth as needed (for indigestion). 08/11/17   Tereasa Coop, PA-C  estradiol (ESTRACE) 0.1 MG/GM vaginal cream Place 0.5 g vaginally 2 (two) times a week. Place 0.5g nightly for two weeks then twice a week after 01/25/20   Jaquita Folds, MD  lovastatin (MEVACOR) 20 MG tablet Take 1 tablet (20 mg total) by mouth at bedtime. 09/08/18 12/07/18  Horald Pollen, MD  meloxicam (MOBIC) 7.5 MG tablet Take 1-2 tablets (7.5-15 mg total) by mouth daily. 08/11/17   Tereasa Coop, PA-C  metFORMIN (GLUCOPHAGE) 500 MG tablet Take 1 tablet (500 mg total) by mouth daily with breakfast. 08/11/17   Tereasa Coop, PA-C  metoprolol tartrate (LOPRESSOR) 50 MG  tablet TAKE 1 TABLET BY MOUTH DAILY 09/08/18   Horald Pollen, MD  mirabegron ER (MYRBETRIQ) 50 MG TB24 tablet Take 50 mg by mouth daily.    [provider]  nitrofurantoin, macrocrystal-monohydrate, (MACROBID) 100 MG capsule Take 1 capsule (100 mg total) by mouth 2 (two) times daily. Patient not taking: Reported on 01/11/2020 11/27/19   Scot Jun, FNP  RESTASIS 0.05 % ophthalmic emulsion Place 2 drops into both eyes 2 (two) times daily. 11/14/13   [provider]  vortioxetine HBr (TRINTELLIX) 20 MG TABS tablet Take 1 tablet (20 mg total) by mouth daily. 08/11/17   Tereasa Coop, PA-C    Family History Family History  Problem Relation Age of Onset  . Emphysema Father   . Rheum arthritis Father   . Lung cancer Father   . Heart disease Mother   . Breast cancer Mother        onset in 36's  . Asthma Brother   . Heart  disease Brother   . Rheum arthritis Brother   . Diabetes Brother   . Colon cancer Neg Hx   . Stomach cancer Neg Hx     Social History Social History   Tobacco Use  . Smoking status: Former Smoker    Packs/day: 0.80    Years: 20.00    Pack years: 16.00    Types: Cigarettes    Quit date: 03/09/1998    Years since quitting: 22.0  . Smokeless tobacco: Never Used  Vaping Use  . Vaping Use: Never used  Substance Use Topics  . Alcohol use: Not Currently    Alcohol/week: 0.0 standard drinks  . Drug use: No     Allergies   Citrus, Dust mite extract, Naproxen, and Other   Review of Systems Review of Systems  All other systems reviewed and are negative.    Physical Exam Triage Vital Signs ED Triage Vitals  Enc Vitals Group     BP 03/30/20 1756 (!) 179/80     Pulse Rate 03/30/20 1756 62     Resp 03/30/20 1756 18     Temp 03/30/20 1756 98.4 F (36.9 C)     Temp Source 03/30/20 1756 Oral     SpO2 03/30/20 1756 95 %     Weight --      Height --      Head Circumference --      Peak Flow --      Pain Score 03/30/20 1753 10     Pain Loc --      Pain Edu? --      Excl. in [place]? --    No data found.  Updated Vital Signs BP (!) 179/80 (BP Location: Right Arm)   Pulse 62   Temp 98.4 F (36.9 C) (Oral)   Resp 18   SpO2 95%   Visual Acuity Right Eye Distance:   Left Eye Distance:   Bilateral Distance:    Right Eye Near:   Left Eye Near:    Bilateral Near:     Physical Exam Vitals and nursing note reviewed.  Constitutional:      Appearance: She is well-developed and well-nourished.  HENT:     Head: Normocephalic.  Eyes:     Extraocular Movements: EOM normal.  Cardiovascular:     Rate and Rhythm: Normal rate.  Pulmonary:     Effort: Pulmonary effort is normal.  Musculoskeletal:        General: Tenderness present.  Cervical back: Normal range of motion.  Skin:    General: Skin is warm.  Neurological:     General: No focal deficit present.     Mental  Status: She is alert and oriented to person, place, and time.  Psychiatric:        Mood and Affect: Mood and affect and mood normal.      UC Treatments / Results  Labs (all labs ordered are listed, but only abnormal results are displayed) Labs Reviewed - No data to display  EKG   Radiology No results found.  Procedures Procedures (including critical care time)  Medications Ordered in UC Medications - No data to display  Initial Impression / Assessment and Plan / UC Course  I have reviewed the triage vital signs and the nursing notes.  Pertinent labs & imaging results that were available during my care of the patient were reviewed by me and considered in my medical decision making (see chart for details).     MDM:  Xray hip no fracture.  Pt unable to tolerate weight bearing.  Pt states she can not stand up. Pt can not stand to go to bathroom.  Pt to ED for further evaluation  Final Clinical Impressions(s) / UC Diagnoses   Final diagnoses:  Right hip pain   Discharge Instructions   None    ED Prescriptions    None     PDMP not reviewed this encounter.   Fransico Meadow, PA-C 03/30/20 1841    Fransico Meadow, PA-C 03/30/20 1843

## 2020-03-30 NOTE — ED Triage Notes (Signed)
Patient lost her balance and fell at 2am this morning at home , no LOC/alert and oriented , respirations unlabored , pain at right hip with movement , seen at urgent care X-ray done .

## 2020-03-30 NOTE — ED Triage Notes (Signed)
Pt fell last night and now has sever Rt hip and groin pain. Pt unable to bear weigh ton Rt leg to stand.

## 2020-03-30 NOTE — Discharge Instructions (Addendum)
Go to the ED for evaluation

## 2020-03-31 MED ORDER — OXYCODONE-ACETAMINOPHEN 5-325 MG PO TABS: 1.0000 | ORAL_TABLET | Freq: Three times a day (TID) | ORAL | 0 refills | Status: AC | PRN

## 2020-03-31 MED ORDER — DIAZEPAM 2 MG PO TABS
2.0000 mg | ORAL_TABLET | Freq: Once | ORAL | Status: AC
  Administered 2020-03-31: 2 mg via ORAL
  Filled 2020-03-31: qty 1

## 2020-03-31 MED ORDER — HYDROMORPHONE HCL 1 MG/ML IJ SOLN
1.0000 mg | Freq: Once | INTRAMUSCULAR | Status: AC
  Administered 2020-03-31: 1 mg via INTRAMUSCULAR
  Filled 2020-03-31: qty 1

## 2020-03-31 MED ORDER — OXYCODONE-ACETAMINOPHEN 5-325 MG PO TABS
1.0000 | ORAL_TABLET | Freq: Once | ORAL | Status: AC
  Administered 2020-03-31: 1 via ORAL
  Filled 2020-03-31: qty 1

## 2020-03-31 MED ORDER — KETOROLAC TROMETHAMINE 60 MG/2ML IM SOLN
30.0000 mg | Freq: Once | INTRAMUSCULAR | Status: AC
  Administered 2020-03-31: 30 mg via INTRAMUSCULAR
  Filled 2020-03-31: qty 2

## 2020-03-31 NOTE — Progress Notes (Signed)
Orthopedic Tech Progress Note Patient Details:  SHAQUISHA WYNN 15-Jun-1950 [id number]  Patient ID: [name], female   DOB: [birth year], 70 y.o.   MRN: [id number] I did crutch training after the pt already had crutches.  Trinna Post 03/31/2020, 6:03 AM
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
## 2020-04-01 ENCOUNTER — Telehealth: Payer: Self-pay | Admitting: *Deleted

## 2020-04-01 NOTE — Telephone Encounter (Signed)
RNCM left message for pt to pick up order for DME 4 wheeled rolling walker with seat.

## 2020-07-10 ENCOUNTER — Other Ambulatory Visit: Payer: 59 | Admitting: Internal Medicine

## 2020-07-10 DIAGNOSIS — N3946 Mixed incontinence: Secondary | ICD-10-CM | POA: Diagnosis not present

## 2020-07-10 DIAGNOSIS — E1122 Type 2 diabetes mellitus with diabetic chronic kidney disease: Secondary | ICD-10-CM | POA: Diagnosis not present

## 2020-07-10 DIAGNOSIS — G4452 New daily persistent headache (NDPH): Secondary | ICD-10-CM | POA: Diagnosis not present

## 2020-07-10 DIAGNOSIS — R69 Illness, unspecified: Secondary | ICD-10-CM | POA: Diagnosis not present

## 2020-07-10 DIAGNOSIS — E785 Hyperlipidemia, unspecified: Secondary | ICD-10-CM | POA: Diagnosis not present

## 2020-07-10 DIAGNOSIS — I129 Hypertensive chronic kidney disease with stage 1 through stage 4 chronic kidney disease, or unspecified chronic kidney disease: Secondary | ICD-10-CM | POA: Diagnosis not present

## 2020-07-10 DIAGNOSIS — Z87891 Personal history of nicotine dependence: Secondary | ICD-10-CM | POA: Diagnosis not present

## 2020-07-13 ENCOUNTER — Other Ambulatory Visit: Payer: Self-pay

## 2020-07-13 ENCOUNTER — Ambulatory Visit
Admission: RE | Admit: 2020-07-13 | Discharge: 2020-07-13 | Disposition: A | Discharge status: Home / Self Care | Payer: Medicare HMO | Source: Ambulatory Visit | Attending: Internal Medicine | Admitting: Internal Medicine

## 2020-07-13 DIAGNOSIS — G4452 New daily persistent headache (NDPH): Secondary | ICD-10-CM

## 2020-07-13 DIAGNOSIS — R519 Headache, unspecified: Secondary | ICD-10-CM | POA: Diagnosis not present

## 2020-07-13 DIAGNOSIS — R410 Disorientation, unspecified: Secondary | ICD-10-CM | POA: Diagnosis not present

## 2020-07-13 MED ORDER — GADOBENATE DIMEGLUMINE 529 MG/ML IV SOLN
20.0000 mL | Freq: Once | INTRAVENOUS | Status: AC | PRN
  Administered 2020-07-13: 20 mL via INTRAVENOUS

## 2020-07-21 ENCOUNTER — Other Ambulatory Visit: Payer: 59

## 2020-09-10 DIAGNOSIS — G4733 Obstructive sleep apnea (adult) (pediatric): Secondary | ICD-10-CM | POA: Diagnosis not present

## 2020-11-07 DIAGNOSIS — G4733 Obstructive sleep apnea (adult) (pediatric): Secondary | ICD-10-CM | POA: Diagnosis not present

## 2020-11-26 DIAGNOSIS — Z87891 Personal history of nicotine dependence: Secondary | ICD-10-CM | POA: Diagnosis not present

## 2020-11-26 DIAGNOSIS — E1122 Type 2 diabetes mellitus with diabetic chronic kidney disease: Secondary | ICD-10-CM | POA: Diagnosis not present

## 2020-11-26 DIAGNOSIS — I129 Hypertensive chronic kidney disease with stage 1 through stage 4 chronic kidney disease, or unspecified chronic kidney disease: Secondary | ICD-10-CM | POA: Diagnosis not present

## 2020-11-26 DIAGNOSIS — Z23 Encounter for immunization: Secondary | ICD-10-CM | POA: Diagnosis not present

## 2020-11-26 DIAGNOSIS — E785 Hyperlipidemia, unspecified: Secondary | ICD-10-CM | POA: Diagnosis not present

## 2020-11-26 DIAGNOSIS — R69 Illness, unspecified: Secondary | ICD-10-CM | POA: Diagnosis not present

## 2020-12-03 DIAGNOSIS — G4733 Obstructive sleep apnea (adult) (pediatric): Secondary | ICD-10-CM | POA: Diagnosis not present

## 2020-12-11 DIAGNOSIS — G4733 Obstructive sleep apnea (adult) (pediatric): Secondary | ICD-10-CM | POA: Diagnosis not present

## 2020-12-19 DIAGNOSIS — G4733 Obstructive sleep apnea (adult) (pediatric): Secondary | ICD-10-CM | POA: Diagnosis not present

## 2021-01-02 DIAGNOSIS — G4733 Obstructive sleep apnea (adult) (pediatric): Secondary | ICD-10-CM | POA: Diagnosis not present

## 2021-01-25 ENCOUNTER — Emergency Department (HOSPITAL_COMMUNITY): Payer: Medicare HMO

## 2021-01-25 ENCOUNTER — Emergency Department (HOSPITAL_COMMUNITY)
Admission: EM | Admit: 2021-01-25 | Discharge: 2021-01-25 | Disposition: A | Discharge status: Home / Self Care | Payer: Medicare HMO | Attending: Emergency Medicine | Admitting: Emergency Medicine

## 2021-01-25 ENCOUNTER — Encounter (HOSPITAL_COMMUNITY): Payer: Self-pay | Admitting: Emergency Medicine

## 2021-01-25 ENCOUNTER — Other Ambulatory Visit: Payer: Self-pay

## 2021-01-25 DIAGNOSIS — Z79899 Other long term (current) drug therapy: Secondary | ICD-10-CM | POA: Diagnosis not present

## 2021-01-25 DIAGNOSIS — R0789 Other chest pain: Secondary | ICD-10-CM | POA: Insufficient documentation

## 2021-01-25 DIAGNOSIS — M47812 Spondylosis without myelopathy or radiculopathy, cervical region: Secondary | ICD-10-CM | POA: Diagnosis not present

## 2021-01-25 DIAGNOSIS — R079 Chest pain, unspecified: Secondary | ICD-10-CM | POA: Diagnosis not present

## 2021-01-25 DIAGNOSIS — E119 Type 2 diabetes mellitus without complications: Secondary | ICD-10-CM | POA: Insufficient documentation

## 2021-01-25 DIAGNOSIS — Z87891 Personal history of nicotine dependence: Secondary | ICD-10-CM | POA: Diagnosis not present

## 2021-01-25 DIAGNOSIS — M542 Cervicalgia: Secondary | ICD-10-CM | POA: Diagnosis not present

## 2021-01-25 DIAGNOSIS — I10 Essential (primary) hypertension: Secondary | ICD-10-CM | POA: Diagnosis not present

## 2021-01-25 DIAGNOSIS — M4802 Spinal stenosis, cervical region: Secondary | ICD-10-CM | POA: Diagnosis not present

## 2021-01-25 DIAGNOSIS — Y92410 Unspecified street and highway as the place of occurrence of the external cause: Secondary | ICD-10-CM | POA: Insufficient documentation

## 2021-01-25 DIAGNOSIS — S199XXA Unspecified injury of neck, initial encounter: Secondary | ICD-10-CM | POA: Diagnosis not present

## 2021-01-25 DIAGNOSIS — J4520 Mild intermittent asthma, uncomplicated: Secondary | ICD-10-CM | POA: Insufficient documentation

## 2021-01-25 DIAGNOSIS — M545 Low back pain, unspecified: Secondary | ICD-10-CM | POA: Diagnosis not present

## 2021-01-25 DIAGNOSIS — Z7984 Long term (current) use of oral hypoglycemic drugs: Secondary | ICD-10-CM | POA: Insufficient documentation

## 2021-01-25 DIAGNOSIS — Q7649 Other congenital malformations of spine, not associated with scoliosis: Secondary | ICD-10-CM | POA: Diagnosis not present

## 2021-01-25 LAB — CBC
HCT: 37.4 % (ref 36.0–46.0)
Hemoglobin: 11.9 g/dL — ABNORMAL LOW (ref 12.0–15.0)
MCH: 29.4 pg (ref 26.0–34.0)
MCHC: 31.8 g/dL (ref 30.0–36.0)
MCV: 92.3 fL (ref 80.0–100.0)
Platelets: 243 10*3/uL (ref 150–400)
RBC: 4.05 MIL/uL (ref 3.87–5.11)
RDW: 13 % (ref 11.5–15.5)
WBC: 6.4 10*3/uL (ref 4.0–10.5)
nRBC: 0 % (ref 0.0–0.2)

## 2021-01-25 LAB — TROPONIN I (HIGH SENSITIVITY)
Troponin I (High Sensitivity): 4 ng/L (ref <18)
Troponin I (High Sensitivity): 4 ng/L (ref <18)

## 2021-01-25 LAB — BASIC METABOLIC PANEL
Anion gap: 9 (ref 5–15)
BUN: 15 mg/dL (ref 8–23)
CO2: 30 mmol/L (ref 22–32)
Calcium: 9.1 mg/dL (ref 8.9–10.3)
Chloride: 102 mmol/L (ref 98–111)
Creatinine, Ser: 1.2 mg/dL — ABNORMAL HIGH (ref 0.44–1.00)
GFR, Estimated: 49 mL/min — ABNORMAL LOW (ref >60)
Glucose, Bld: 119 mg/dL — ABNORMAL HIGH (ref 70–99)
Potassium: 3.8 mmol/L (ref 3.5–5.1)
Sodium: 141 mmol/L (ref 135–145)

## 2021-01-25 MED ORDER — HYDROCODONE-ACETAMINOPHEN 5-325 MG PO TABS
1.0000 | ORAL_TABLET | Freq: Once | ORAL | Status: AC
  Administered 2021-01-25: 1 via ORAL
  Filled 2021-01-25: qty 1

## 2021-01-25 MED ORDER — CYCLOBENZAPRINE HCL 10 MG PO TABS: 10.0000 mg | ORAL_TABLET | Freq: Two times a day (BID) | ORAL | 0 refills | Status: AC | PRN
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
## 2021-01-25 NOTE — Discharge Instructions (Addendum)
Take the medications as prescribed to help with muscle spasms and discomfort.  You can take over-the-counter medications such as Tylenol in addition to help with the pain.  Your symptoms should improve over the next week
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
## 2021-01-25 NOTE — ED Notes (Signed)
PA in room to assess pt.  Pt now reports pain to center of chest last night that lasted approx 30 min.

## 2021-02-25 DIAGNOSIS — G4733 Obstructive sleep apnea (adult) (pediatric): Secondary | ICD-10-CM | POA: Diagnosis not present

## 2021-03-18 DIAGNOSIS — Z0000 Encounter for general adult medical examination without abnormal findings: Secondary | ICD-10-CM | POA: Diagnosis not present

## 2021-03-18 DIAGNOSIS — R69 Illness, unspecified: Secondary | ICD-10-CM | POA: Diagnosis not present

## 2021-03-18 DIAGNOSIS — Z1159 Encounter for screening for other viral diseases: Secondary | ICD-10-CM | POA: Diagnosis not present

## 2021-03-18 DIAGNOSIS — E1122 Type 2 diabetes mellitus with diabetic chronic kidney disease: Secondary | ICD-10-CM | POA: Diagnosis not present

## 2021-03-18 DIAGNOSIS — F339 Major depressive disorder, recurrent, unspecified: Secondary | ICD-10-CM | POA: Diagnosis not present

## 2021-03-18 DIAGNOSIS — Z87891 Personal history of nicotine dependence: Secondary | ICD-10-CM | POA: Diagnosis not present

## 2021-03-18 DIAGNOSIS — Z1339 Encounter for screening examination for other mental health and behavioral disorders: Secondary | ICD-10-CM | POA: Diagnosis not present

## 2021-03-18 DIAGNOSIS — Z1331 Encounter for screening for depression: Secondary | ICD-10-CM | POA: Diagnosis not present

## 2021-03-18 DIAGNOSIS — I129 Hypertensive chronic kidney disease with stage 1 through stage 4 chronic kidney disease, or unspecified chronic kidney disease: Secondary | ICD-10-CM | POA: Diagnosis not present

## 2021-03-18 DIAGNOSIS — E785 Hyperlipidemia, unspecified: Secondary | ICD-10-CM | POA: Diagnosis not present

## 2021-03-18 DIAGNOSIS — N3946 Mixed incontinence: Secondary | ICD-10-CM | POA: Diagnosis not present

## 2021-03-20 ENCOUNTER — Telehealth: Payer: Self-pay | Admitting: Hematology

## 2021-03-20 NOTE — Telephone Encounter (Signed)
Scheduled appt per 1/12 referral. Pt is aware of appt date and time. Pt is aware to arrive 15 mins prior to appt time.  °

## 2021-03-26 ENCOUNTER — Other Ambulatory Visit: Payer: Self-pay

## 2021-03-26 ENCOUNTER — Inpatient Hospital Stay: Payer: Medicare HMO | Attending: Hematology | Admitting: Hematology

## 2021-03-26 ENCOUNTER — Inpatient Hospital Stay: Payer: Medicare HMO

## 2021-03-26 VITALS — BP 146/53 | HR 59 | Temp 98.1°F | Resp 18 | Wt 260.6 lb

## 2021-03-26 DIAGNOSIS — Z801 Family history of malignant neoplasm of trachea, bronchus and lung: Secondary | ICD-10-CM | POA: Insufficient documentation

## 2021-03-26 DIAGNOSIS — Z836 Family history of other diseases of the respiratory system: Secondary | ICD-10-CM | POA: Diagnosis not present

## 2021-03-26 DIAGNOSIS — Z833 Family history of diabetes mellitus: Secondary | ICD-10-CM | POA: Insufficient documentation

## 2021-03-26 DIAGNOSIS — D696 Thrombocytopenia, unspecified: Secondary | ICD-10-CM | POA: Diagnosis not present

## 2021-03-26 DIAGNOSIS — Z803 Family history of malignant neoplasm of breast: Secondary | ICD-10-CM | POA: Insufficient documentation

## 2021-03-26 DIAGNOSIS — Z79899 Other long term (current) drug therapy: Secondary | ICD-10-CM | POA: Insufficient documentation

## 2021-03-26 DIAGNOSIS — Z8249 Family history of ischemic heart disease and other diseases of the circulatory system: Secondary | ICD-10-CM | POA: Diagnosis not present

## 2021-03-26 DIAGNOSIS — Z87891 Personal history of nicotine dependence: Secondary | ICD-10-CM | POA: Diagnosis not present

## 2021-03-26 DIAGNOSIS — Z9049 Acquired absence of other specified parts of digestive tract: Secondary | ICD-10-CM | POA: Diagnosis not present

## 2021-03-26 DIAGNOSIS — Z886 Allergy status to analgesic agent status: Secondary | ICD-10-CM | POA: Diagnosis not present

## 2021-03-26 DIAGNOSIS — Z8261 Family history of arthritis: Secondary | ICD-10-CM | POA: Diagnosis not present

## 2021-03-26 LAB — CMP (CANCER CENTER ONLY)
ALT: 18 U/L (ref 0–44)
AST: 14 U/L — ABNORMAL LOW (ref 15–41)
Albumin: 4 g/dL (ref 3.5–5.0)
Alkaline Phosphatase: 55 U/L (ref 38–126)
Anion gap: 4 — ABNORMAL LOW (ref 5–15)
BUN: 17 mg/dL (ref 8–23)
CO2: 31 mmol/L (ref 22–32)
Calcium: 8.8 mg/dL — ABNORMAL LOW (ref 8.9–10.3)
Chloride: 105 mmol/L (ref 98–111)
Creatinine: 1.26 mg/dL — ABNORMAL HIGH (ref 0.44–1.00)
GFR, Estimated: 46 mL/min — ABNORMAL LOW (ref >60)
Glucose, Bld: 104 mg/dL — ABNORMAL HIGH (ref 70–99)
Potassium: 4.1 mmol/L (ref 3.5–5.1)
Sodium: 140 mmol/L (ref 135–145)
Total Bilirubin: 0.3 mg/dL (ref 0.3–1.2)
Total Protein: 7.2 g/dL (ref 6.5–8.1)

## 2021-03-26 LAB — CBC WITH DIFFERENTIAL/PLATELET
Abs Immature Granulocytes: 0.01 10*3/uL (ref 0.00–0.07)
Basophils Absolute: 0.1 10*3/uL (ref 0.0–0.1)
Basophils Relative: 1 %
Eosinophils Absolute: 0.1 10*3/uL (ref 0.0–0.5)
Eosinophils Relative: 2 %
HCT: 36.7 % (ref 36.0–46.0)
Hemoglobin: 11.6 g/dL — ABNORMAL LOW (ref 12.0–15.0)
Immature Granulocytes: 0 %
Lymphocytes Relative: 32 %
Lymphs Abs: 1.7 10*3/uL (ref 0.7–4.0)
MCH: 29.2 pg (ref 26.0–34.0)
MCHC: 31.6 g/dL (ref 30.0–36.0)
MCV: 92.4 fL (ref 80.0–100.0)
Monocytes Absolute: 0.5 10*3/uL (ref 0.1–1.0)
Monocytes Relative: 10 %
Neutro Abs: 2.9 10*3/uL (ref 1.7–7.7)
Neutrophils Relative %: 55 %
Platelets: 233 10*3/uL (ref 150–400)
RBC: 3.97 MIL/uL (ref 3.87–5.11)
RDW: 13.2 % (ref 11.5–15.5)
WBC: 5.2 10*3/uL (ref 4.0–10.5)
nRBC: 0 % (ref 0.0–0.2)

## 2021-03-26 LAB — HEPATITIS C ANTIBODY: HCV Ab: NONREACTIVE

## 2021-03-26 LAB — VITAMIN B12: Vitamin B-12: 1696 pg/mL — ABNORMAL HIGH (ref 180–914)

## 2021-03-26 LAB — SEDIMENTATION RATE: Sed Rate: 25 mm/hr — ABNORMAL HIGH (ref 0–22)

## 2021-03-26 LAB — IMMATURE PLATELET FRACTION: Immature Platelet Fraction: 4.3 % (ref 1.2–8.6)

## 2021-03-26 NOTE — Patient Instructions (Signed)
Thank you for choosing [facility] to provide your care.   Should you have questions after your visit to the [facility] ([facility]), please contact this office at [phone] between 8:30 AM and 4:30 PM.  Voice mails left after 4:00 PM may not be returned until the following business day.  Calls received after 4:30 PM will be answered by an off-site Nurse Triage Line.    Prescription Refills:  Please have your pharmacy contact us directly for most prescription requests.  Contact the office directly for refills of narcotics (pain medications). Allow 48-72 hours for refills.  Appointments: Please contact the [facility] scheduling department [phone] for questions regarding [facility] appointment scheduling.  Contact the schedulers with any scheduling changes so that your appointment can be rescheduled in a timely manner.   Central Scheduling for [facility] [phone] - Call to schedule procedures such as PET scans, CT scans, MRI, Ultrasound, etc.  To afford each patient quality time with our providers, please arrive 30 minutes before your scheduled appointment time.  If you arrive late for your appointment, you may be asked to reschedule.  We strive to give you quality time with our providers, and arriving late affects you and other patients whose appointments are after yours. If you are a no show for multiple scheduled visits, you may be dismissed from the clinic at the providers discretion.     Resources: [facility] Social Workers [phone] for additional information on assistance programs or assistance connecting with community support programs   [place] DSS  [phone]: Information regarding food stamps, Medicaid, and utility assistance [place] [employer] [place] [phone]   [place] [employer]'s shared-ride transportation service for eligible riders who have a disability that prevents them from riding the fixed route bus.   Medicare Rights Center 800-333-4114  Helps people with Medicare understand their rights and benefits, navigate the Medicare system, and secure the quality healthcare they deserve [employer] 800-227-2345 Assists patients locate various types of support and financial assistance Cancer Care: 1-800-813-HOPE (4673) Provides financial assistance, online support groups, medication/co-pay assistance.   Transportation Assistance for appointments at [facility]: Transportation Coordinator [phone]  Again, thank you for choosing [facility] for your care.       

## 2021-03-26 NOTE — Progress Notes (Addendum)
Marland Kitchen   HEMATOLOGY/ONCOLOGY CONSULTATION NOTE  Date of Service: 03/26/2021  Patient Care Team: Michael Boston, MD as PCP - General (Internal Medicine)  CHIEF COMPLAINTS/PURPOSE OF CONSULTATION:  thrombocytopenia  HISTORY OF PRESENTING ILLNESS:   Jocelyn Little is a wonderful 71 y.o. female who has been referred to [place] by Dr .Jacalyn Lefevre, Jesse Sans, MD for evaluation and management of thrombocytopenia.  Patient has a history of hypertension, dyslipidemia, GERD, obstructive sleep apnea who had routine follow-up and lab testing with her primary care physician on 03/18/2021 with CBC showing hemoglobin of 14.8 with an MCV of 88.7, WBC count 6.2k and low platelets with a platelet count of 45k.  Hepatitis C antibody testing 03/18/2021 negative TSH 03/18/2021 1.62 CMP showed normal creatinine of 1 no other overt abnormalities.  Previous labs showed CBC 03/28/2020 with normal platelet count of 204k with normal hemoglobin and WBC count.  Patient notes no recent viral infections or vaccines. Denies any recent new medications.  No use of NSAIDs recently. Does use as needed PPIs. Is on metformin with B12 supplementation.  Patient denies any abnormal bleeding or bruising.  No nosebleeds no gum bleeds no overt GI bleeding no abnormal bruises. Patient denies any family history of blood disorders or bleeding disorders. Patient denies any previous history of excessive bleeding with her past surgeries.  MEDICAL HISTORY:  Past Medical History:  Diagnosis Date   Diabetes mellitus without complication (HCC)    GERD (gastroesophageal reflux disease)    High cholesterol    HTN (hypertension)    OSA (obstructive sleep apnea)    Rhinitis    Urinary incontinence     SURGICAL HISTORY: Past Surgical History:  Procedure Laterality Date   CHOLECYSTECTOMY     LIPOMA EXCISION     TOTAL ABDOMINAL HYSTERECTOMY     UVULOPALATOPHARYNGOPLASTY      SOCIAL HISTORY: Social History   Socioeconomic History    Marital status: Significant Other    Spouse name: Not on file   Number of children: 4   Years of education: GED   Highest education level: Not on file  Occupational History   Occupation: retired Oncologist  Tobacco Use   Smoking status: Former    Packs/day: 0.80    Years: 20.00    Pack years: 16.00    Types: Cigarettes    Quit date: 03/09/1998    Years since quitting: 23.0   Smokeless tobacco: Never  Vaping Use   Vaping Use: Never used  Substance and Sexual Activity   Alcohol use: Not Currently    Alcohol/week: 0.0 standard drinks   Drug use: No   Sexual activity: Yes    Birth control/protection: Surgical    Comment: 1st intercourse 71 yo-More than 5 partners  Other Topics Concern   Not on file  Social History Narrative   Has 4 children and 3 of them live with her in a one story home.  Retired from being a Animal nutritionist and a Electronics engineer.   Education:  High school and some college.       Patient very rarely drinks caffeine.   Patient is right handed.    Social Determinants of Health   Financial Resource Strain: Not on file  Food Insecurity: Not on file  Transportation Needs: Not on file  Physical Activity: Not on file  Stress: Not on file  Social Connections: Not on file  Intimate Partner Violence: Not on file    FAMILY HISTORY: Family History  Problem Relation Age of Onset  Emphysema Father    Rheum arthritis Father    Lung cancer Father    Heart disease Mother    Breast cancer Mother        onset in 64's   Asthma Brother    Heart disease Brother    Rheum arthritis Brother    Diabetes Brother    Colon cancer Neg Hx    Stomach cancer Neg Hx     ALLERGIES:  is allergic to citrus, dust mite extract, naproxen, and other.  MEDICATIONS:  Current Outpatient Medications  Medication Sig Dispense Refill   acetaminophen (TYLENOL) 650 MG CR tablet Take 1,300 mg by mouth every 8 (eight) hours as needed for pain.     albuterol (PROVENTIL HFA;VENTOLIN HFA) 108  (90 Base) MCG/ACT inhaler Inhale 2 puffs into the lungs every 6 (six) hours as needed for wheezing. 1 Inhaler 11   amLODipine (NORVASC) 5 MG tablet TAKE 1 TABLET BY MOUTH DAILY (Patient taking differently: Take 5 mg by mouth daily.) 90 tablet 3   blood glucose meter kit and supplies KIT Dispense based on patient and insurance preference. Use to check cbgs qd - fasting in the morning or 2 hours after a meal, E11.9 1 each 0   buPROPion (WELLBUTRIN XL) 150 MG 24 hr tablet Take 1 tablet (150 mg total) by mouth daily. 90 tablet 3   Cyanocobalamin 1000 MCG/ML LIQD Take 1 Dose by mouth 2 (two) times a week.     cyclobenzaprine (FLEXERIL) 10 MG tablet Take 1 tablet (10 mg total) by mouth 2 (two) times daily as needed for muscle spasms. 20 tablet 0   esomeprazole (NEXIUM) 40 MG capsule Take 1 capsule (40 mg total) by mouth as needed (for indigestion). 30 capsule 11   fluticasone (FLONASE) 50 MCG/ACT nasal spray Place 1 spray into both nostrils daily as needed.     Ibuprofen-diphenhydrAMINE Cit (ADVIL PM) 200-38 MG TABS Take 1 tablet by mouth at bedtime as needed (sleep).     lovastatin (MEVACOR) 20 MG tablet Take 1 tablet (20 mg total) by mouth at bedtime. 90 tablet 0   meloxicam (MOBIC) 7.5 MG tablet Take 1-2 tablets (7.5-15 mg total) by mouth daily. (Patient taking differently: Take 7.5-15 mg by mouth daily as needed for pain.) 30 tablet 5   metFORMIN (GLUCOPHAGE) 500 MG tablet Take 1 tablet (500 mg total) by mouth daily with breakfast. 90 tablet 3   metoprolol tartrate (LOPRESSOR) 50 MG tablet TAKE 1 TABLET BY MOUTH DAILY (Patient taking differently: Take 50 mg by mouth at bedtime.) 90 tablet 3   mirabegron ER (MYRBETRIQ) 50 MG TB24 tablet Take 50 mg by mouth every evening.     oxybutynin (DITROPAN-XL) 10 MG 24 hr tablet Take 10 mg by mouth at bedtime.     oxyCODONE-acetaminophen (PERCOCET) 5-325 MG tablet Take 1 tablet by mouth every 8 (eight) hours as needed for severe pain. 10 tablet 0   vortioxetine  HBr (TRINTELLIX) 20 MG TABS tablet Take 1 tablet (20 mg total) by mouth daily. 90 tablet 3   XIIDRA 5 % SOLN Place 1 drop into both eyes 2 (two) times daily.     Current Facility-Administered Medications  Medication Dose Route Frequency Provider Last Rate Last Admin   0.9 %  sodium chloride infusion  500 mL Intravenous Continuous Milus Banister, MD        REVIEW OF SYSTEMS:    .10 Point review of Systems was done is negative except as noted above.  PHYSICAL  EXAMINATION:  ECOG PERFORMANCE STATUS: 1 - Symptomatic but completely ambulatory  Vitals:   03/26/21 1250  BP: (!) 146/53  Pulse: (!) 59  Resp: 18  Temp: 98.1 F (36.7 C)  SpO2: 100%   Filed Weights   03/26/21 1250  Weight: 260 lb 9.6 oz (118.2 kg)   .Body mass index is 44.73 kg/m.  Marland Kitchen GENERAL:alert, in no acute distress and comfortable SKIN: no acute rashes, no significant lesions EYES: conjunctiva are pink and non-injected, sclera anicteric OROPHARYNX: MMM, no exudates, no oropharyngeal erythema or ulceration NECK: supple, no JVD LYMPH:  no palpable lymphadenopathy in the cervical, axillary or inguinal regions LUNGS: clear to auscultation b/l with normal respiratory effort HEART: regular rate & rhythm ABDOMEN:  normoactive bowel sounds , non tender, not distended. Extremity: no pedal edema PSYCH: alert & oriented x 3 with fluent speech NEURO: no focal motor/sensory deficits  LABORATORY DATA:  I have reviewed the data as listed  . CBC Latest Ref Rng & Units 01/25/2021 08/11/2017 05/18/2017  WBC 4.0 - 10.5 K/uL 6.4 5.9 5.2  Hemoglobin 12.0 - 15.0 g/dL 11.9(L) 11.8 12.1(A)  Hematocrit 36.0 - 46.0 % 37.4 36.7 36.5(A)  Platelets 150 - 400 K/uL 243 239 -    CMP Latest Ref Rng & Units 01/25/2021 08/11/2017 05/18/2017  Glucose 70 - 99 mg/dL 119(H) 100(H) 90  BUN 8 - 23 mg/dL 15 13 13   Creatinine 0.44 - 1.00 mg/dL 1.20(H) 1.10(H) 0.88  Sodium 135 - 145 mmol/L 141 141 141  Potassium 3.5 - 5.1 mmol/L 3.8 4.3 4.2   Chloride 98 - 111 mmol/L 102 101 101  CO2 22 - 32 mmol/L 30 27 26   Calcium 8.9 - 10.3 mg/dL 9.1 8.8 9.0  Total Protein 6.0 - 8.5 g/dL - 6.6 -  Total Bilirubin 0.0 - 1.2 mg/dL - <0.2 -  Alkaline Phos 39 - 117 IU/L - 60 -  AST 0 - 40 IU/L - 15 -  ALT 0 - 32 IU/L - 17 -     RADIOGRAPHIC STUDIES: I have personally reviewed the radiological images as listed and agreed with the findings in the report. No results found.  ASSESSMENT & PLAN:   71 year old female with history of hypertension, dyslipidemia, GERD, diabetes with  1) Isolated thrombocytopenia PLT 45k PLan -Patient notes low platelets were noted on scheduled labs.  She has not had any issues with abnormal bleeding or bruising. No recent new medications, acute infections, antibiotic use, changes in diet or appetite or recent vaccinations. -Patient does use PPIs on and off for her GERD -She is on metformin which can cause B12 deficiency but she is on regular B12 replacement. -We discussed potential etiologies of isolated thrombocytopenia including the possibility of pseudothrombocytopenia and ITP among other etiologies. -She has recently had hepatitis C testing which was negative and her TSH screen was within normal limits. -We shall work-up for thrombocytopenia as per lab orders noted below. -Discussed avoiding any over-the-counter supplements other than vitamins and limiting the use of NSAIDS -Further management as per lab results.  2). Patient Active Problem List   Diagnosis Date Noted   ASCUS with positive high risk HPV cervical 05/28/2016   Diabetes mellitus without complication ([place]) 94/49/6759   Asthma, mild intermittent, well-controlled 01/23/2015   Meralgia paresthetica of right side 01/29/2014   Essential hypertension 05/16/2009   G E R D 05/16/2009   Obstructive sleep apnea 03/12/2009   Seasonal and perennial allergic rhinitis 03/12/2009  -Continue follow-up with primary care physician for management  of other  medical issues   Orders Placed This Encounter  Procedures   CBC with Differential/Platelet    Standing Status:   Future    Number of Occurrences:   1    Standing Expiration Date:   03/26/2022   CMP ([place] only)    Standing Status:   Future    Number of Occurrences:   1    Standing Expiration Date:   03/26/2022   Sedimentation rate    Standing Status:   Future    Number of Occurrences:   1    Standing Expiration Date:   03/26/2022   Immature Platelet Fraction    Standing Status:   Future    Number of Occurrences:   1    Standing Expiration Date:   03/26/2022   Hepatitis C antibody    Standing Status:   Future    Number of Occurrences:   1    Standing Expiration Date:   03/26/2022   Vitamin B12    Standing Status:   Future    Number of Occurrences:   1    Standing Expiration Date:   03/26/2022   Folate RBC    Standing Status:   Future    Number of Occurrences:   1    Standing Expiration Date:   03/26/2022   Rheumatoid Arthritis Profile    Standing Status:   Future    Number of Occurrences:   1    Standing Expiration Date:   03/26/2022     All of the patients questions were answered with apparent satisfaction. The patient knows to call the clinic with any problems, questions or concerns.  I spent 35 mins counseling the patient face to face. The total time spent in the appointment was 45 mins including outside medical records review, lab review, obtaining patient's history and physical, discussion of lab findings and etiologies of thrombocytopenia and documentation.   Sullivan Lone MD [place] AAHIVMS [facility] Hematology/Oncology Physician [place]  03/26/2021 1:01 PM

## 2021-03-27 ENCOUNTER — Telehealth: Payer: Self-pay | Admitting: Hematology

## 2021-03-27 LAB — RHEUMATOID ARTHRITIS PROFILE
CCP Antibodies IgG/IgA: 3 units (ref 0–19)
Rheumatoid fact SerPl-aCnc: <10 IU/mL (ref <14.0)

## 2021-03-27 NOTE — Telephone Encounter (Signed)
Scheduled follow-up appointment per 1/18 los. Patient is aware. °

## 2021-03-28 DIAGNOSIS — G4733 Obstructive sleep apnea (adult) (pediatric): Secondary | ICD-10-CM | POA: Diagnosis not present

## 2021-03-28 LAB — FOLATE RBC
Folate, Hemolysate: 303 ng/mL
Folate, RBC: 826 ng/mL (ref >498)
Hematocrit: 36.7 % (ref 34.0–46.6)

## 2021-04-03 ENCOUNTER — Inpatient Hospital Stay (HOSPITAL_BASED_OUTPATIENT_CLINIC_OR_DEPARTMENT_OTHER): Payer: Medicare HMO | Admitting: Hematology

## 2021-04-03 DIAGNOSIS — Z803 Family history of malignant neoplasm of breast: Secondary | ICD-10-CM | POA: Diagnosis not present

## 2021-04-03 DIAGNOSIS — Z9049 Acquired absence of other specified parts of digestive tract: Secondary | ICD-10-CM | POA: Diagnosis not present

## 2021-04-03 DIAGNOSIS — Z833 Family history of diabetes mellitus: Secondary | ICD-10-CM | POA: Diagnosis not present

## 2021-04-03 DIAGNOSIS — Z801 Family history of malignant neoplasm of trachea, bronchus and lung: Secondary | ICD-10-CM | POA: Diagnosis not present

## 2021-04-03 DIAGNOSIS — Z8249 Family history of ischemic heart disease and other diseases of the circulatory system: Secondary | ICD-10-CM | POA: Diagnosis not present

## 2021-04-03 DIAGNOSIS — Z79899 Other long term (current) drug therapy: Secondary | ICD-10-CM | POA: Diagnosis not present

## 2021-04-03 DIAGNOSIS — Z8261 Family history of arthritis: Secondary | ICD-10-CM | POA: Diagnosis not present

## 2021-04-03 DIAGNOSIS — D696 Thrombocytopenia, unspecified: Secondary | ICD-10-CM

## 2021-04-03 DIAGNOSIS — Z886 Allergy status to analgesic agent status: Secondary | ICD-10-CM | POA: Diagnosis not present

## 2021-04-03 DIAGNOSIS — Z87891 Personal history of nicotine dependence: Secondary | ICD-10-CM | POA: Diagnosis not present

## 2021-04-09 NOTE — Progress Notes (Addendum)
Marland Kitchen   HEMATOLOGY/ONCOLOGY PHONE VISIT NOTE  Date of Service: 04/09/2021  Patient Care Team: Michael Boston, MD as PCP - General (Internal Medicine)  CHIEF COMPLAINTS/PURPOSE OF CONSULTATION:   follow-up to discuss lab work-up for thrombocytopenia  INTERVAL HISTORY  .I connected with Jocelyn Little on 04/13/21 at  3:00 PM EST by telephone visit and verified that I am speaking with the correct person using two identifiers.   I discussed the limitations, risks, security and privacy concerns of performing an evaluation and management service by telemedicine and the availability of in-person appointments. I also discussed with the patient that there may be a patient responsible charge related to this service. The patient expressed understanding and agreed to proceed.   Other persons participating in the visit and their role in the encounter: None  Patients location: Home Providers location: [place]  Chief Complaint: Discussion of lab results.  Patient notes no new symptoms since her last clinic visit. We discussed her lab results which showed resolution of her thrombocytopenia with a CBC showing platelet counts of 223k with hemoglobin of 11.6 and WBC count of 5.2k. We discussed that her previous platelet count of 45k could be a false reading due to pseudothrombocytopenia where platelets get stuck to each other parts of the body and are misread by the Coulter counter or it could be a spuriously value due to delayed processing of the blood sample. We discussed that no additional hematologic work-up is recommended at this time.  MEDICAL HISTORY:  Past Medical History:  Diagnosis Date   Diabetes mellitus without complication (HCC)    GERD (gastroesophageal reflux disease)    High cholesterol    HTN (hypertension)    OSA (obstructive sleep apnea)    Rhinitis    Urinary incontinence     SURGICAL HISTORY: Past Surgical History:  Procedure Laterality Date    CHOLECYSTECTOMY     LIPOMA EXCISION     TOTAL ABDOMINAL HYSTERECTOMY     UVULOPALATOPHARYNGOPLASTY      SOCIAL HISTORY: Social History   Socioeconomic History   Marital status: Significant Other    Spouse name: Not on file   Number of children: 4   Years of education: GED   Highest education level: Not on file  Occupational History   Occupation: retired Oncologist  Tobacco Use   Smoking status: Former    Packs/day: 0.80    Years: 20.00    Pack years: 16.00    Types: Cigarettes    Quit date: 03/09/1998    Years since quitting: 23.1   Smokeless tobacco: Never  Vaping Use   Vaping Use: Never used  Substance and Sexual Activity   Alcohol use: Not Currently    Alcohol/week: 0.0 standard drinks   Drug use: No   Sexual activity: Yes    Birth control/protection: Surgical    Comment: 1st intercourse 71 yo-More than 5 partners  Other Topics Concern   Not on file  Social History Narrative   Has 4 children and 3 of them live with her in a one story home.  Retired from being a Animal nutritionist and a Electronics engineer.   Education:  High school and some college.       Patient very rarely drinks caffeine.   Patient is right handed.    Social Determinants of Health   Financial Resource Strain: Not on file  Food Insecurity: Not on file  Transportation Needs: Not on file  Physical Activity: Not on file  Stress: Not on file  Social Connections: Not on file  Intimate Partner Violence: Not on file    FAMILY HISTORY: Family History  Problem Relation Age of Onset   Emphysema Father    Rheum arthritis Father    Lung cancer Father    Heart disease Mother    Breast cancer Mother        onset in 13's   Asthma Brother    Heart disease Brother    Rheum arthritis Brother    Diabetes Brother    Colon cancer Neg Hx    Stomach cancer Neg Hx     ALLERGIES:  is allergic to citrus, dust mite extract, naproxen, and other.  MEDICATIONS:  Current Outpatient Medications  Medication Sig  Dispense Refill   acetaminophen (TYLENOL) 650 MG CR tablet Take 1,300 mg by mouth every 8 (eight) hours as needed for pain.     albuterol (PROVENTIL HFA;VENTOLIN HFA) 108 (90 Base) MCG/ACT inhaler Inhale 2 puffs into the lungs every 6 (six) hours as needed for wheezing. 1 Inhaler 11   amLODipine (NORVASC) 5 MG tablet TAKE 1 TABLET BY MOUTH DAILY (Patient taking differently: Take 5 mg by mouth daily.) 90 tablet 3   blood glucose meter kit and supplies KIT Dispense based on patient and insurance preference. Use to check cbgs qd - fasting in the morning or 2 hours after a meal, E11.9 1 each 0   buPROPion (WELLBUTRIN XL) 150 MG 24 hr tablet Take 1 tablet (150 mg total) by mouth daily. 90 tablet 3   Cyanocobalamin 1000 MCG/ML LIQD Take 1 Dose by mouth 2 (two) times a week.     cyclobenzaprine (FLEXERIL) 10 MG tablet Take 1 tablet (10 mg total) by mouth 2 (two) times daily as needed for muscle spasms. 20 tablet 0   esomeprazole (NEXIUM) 40 MG capsule Take 1 capsule (40 mg total) by mouth as needed (for indigestion). 30 capsule 11   fluticasone (FLONASE) 50 MCG/ACT nasal spray Place 1 spray into both nostrils daily as needed.     Ibuprofen-diphenhydrAMINE Cit (ADVIL PM) 200-38 MG TABS Take 1 tablet by mouth at bedtime as needed (sleep).     lovastatin (MEVACOR) 20 MG tablet Take 1 tablet (20 mg total) by mouth at bedtime. 90 tablet 0   meloxicam (MOBIC) 7.5 MG tablet Take 1-2 tablets (7.5-15 mg total) by mouth daily. (Patient taking differently: Take 7.5-15 mg by mouth daily as needed for pain.) 30 tablet 5   metFORMIN (GLUCOPHAGE) 500 MG tablet Take 1 tablet (500 mg total) by mouth daily with breakfast. 90 tablet 3   metoprolol tartrate (LOPRESSOR) 50 MG tablet TAKE 1 TABLET BY MOUTH DAILY (Patient taking differently: Take 50 mg by mouth at bedtime.) 90 tablet 3   mirabegron ER (MYRBETRIQ) 50 MG TB24 tablet Take 50 mg by mouth every evening.     oxybutynin (DITROPAN-XL) 10 MG 24 hr tablet Take 10 mg by  mouth at bedtime.     oxyCODONE-acetaminophen (PERCOCET) 5-325 MG tablet Take 1 tablet by mouth every 8 (eight) hours as needed for severe pain. 10 tablet 0   vortioxetine HBr (TRINTELLIX) 20 MG TABS tablet Take 1 tablet (20 mg total) by mouth daily. 90 tablet 3   XIIDRA 5 % SOLN Place 1 drop into both eyes 2 (two) times daily.     Current Facility-Administered Medications  Medication Dose Route Frequency Provider Last Rate Last Admin   0.9 %  sodium chloride infusion  500 mL Intravenous Continuous  Milus Banister, MD        REVIEW OF SYSTEMS:    .10 Point review of Systems was done is negative except as noted above.  PHYSICAL EXAMINATION: Telemedicine visit  LABORATORY DATA:  I have reviewed the data as listed  . CBC Latest Ref Rng & Units 03/26/2021 03/26/2021 01/25/2021  WBC 4.0 - 10.5 K/uL 5.2 - 6.4  Hemoglobin 12.0 - 15.0 g/dL 11.6(L) - 11.9(L)  Hematocrit 34.0 - 46.6 % 36.7 36.7 37.4  Platelets 150 - 400 K/uL 233 - 243    . CMP Latest Ref Rng & Units 03/26/2021 01/25/2021 08/11/2017  Glucose 70 - 99 mg/dL 104(H) 119(H) 100(H)  BUN 8 - 23 mg/dL 17 15 13   Creatinine 0.44 - 1.00 mg/dL 1.26(H) 1.20(H) 1.10(H)  Sodium 135 - 145 mmol/L 140 141 141  Potassium 3.5 - 5.1 mmol/L 4.1 3.8 4.3  Chloride 98 - 111 mmol/L 105 102 101  CO2 22 - 32 mmol/L 31 30 27   Calcium 8.9 - 10.3 mg/dL 8.8(L) 9.1 8.8  Total Protein 6.5 - 8.1 g/dL 7.2 - 6.6  Total Bilirubin 0.3 - 1.2 mg/dL 0.3 - <0.2  Alkaline Phos 38 - 126 U/L 55 - 60  AST 15 - 41 U/L 14(L) - 15  ALT 0 - 44 U/L 18 - 17   Component     Latest Ref Rng & Units 03/26/2021  Folate, Hemolysate     Not Estab. ng/mL 303.0  HCT     34.0 - 46.6 % 36.7  Folate, RBC     >498 ng/mL 826  RA Latex Turbid.     <14.0 IU/mL <10.0  CCP Antibodies IgG/IgA     0 - 19 units 3  HCV Ab     NON REACTIVE NON REACTIVE  Vitamin B12     180 - 914 pg/mL 1,696 (H)  Immature Platelet Fraction     1.2 - 8.6 % 4.3  Sed Rate     0 - 22 mm/hr 25 (H)     RADIOGRAPHIC STUDIES: I have personally reviewed the radiological images as listed and agreed with the findings in the report. No results found.  ASSESSMENT & PLAN:   71 year old female with history of hypertension, diabetes, dyslipidemia with  1) thrombocytopenia -platelets previously noted to be 45k on 03/18/2021. plan We discussed her lab results from 03/26/2021 which showed resolution of her thrombocytopenia with a CBC showing platelet counts of 223k with hemoglobin of 11.6 and WBC count of 5.2k. We discussed that her previous platelet count of 45k could be a false reading due to pseudothrombocytopenia where platelets get stuck to each other parts of the body and are misread by the Coulter counter or it could be a spuriously value due to delayed processing of the blood sample. We discussed that no additional hematologic work-up is recommended at this time. -Her other labs including B34 levels folic acid levels are unremarkable Hepatitis C testing was negative She was having arthritic pains and a rheumatoid arthritis panel was done which was negative -Continue follow-up with primary care physician  All of the patients questions were answered with apparent satisfaction. The patient knows to call the clinic with any problems, questions or concerns.    Sullivan Lone MD [place] AAHIVMS [facility] Hematology/Oncology Physician [facility]

## 2021-04-11 DIAGNOSIS — E1140 Type 2 diabetes mellitus with diabetic neuropathy, unspecified: Secondary | ICD-10-CM | POA: Diagnosis not present

## 2021-04-11 DIAGNOSIS — I10 Essential (primary) hypertension: Secondary | ICD-10-CM | POA: Diagnosis not present

## 2021-04-11 DIAGNOSIS — R69 Illness, unspecified: Secondary | ICD-10-CM | POA: Diagnosis not present

## 2021-04-23 ENCOUNTER — Encounter: Payer: Self-pay | Admitting: Skilled Nursing Facility1

## 2021-04-23 ENCOUNTER — Other Ambulatory Visit: Payer: Self-pay

## 2021-04-23 ENCOUNTER — Encounter: Payer: Medicare HMO | Attending: Internal Medicine | Admitting: Skilled Nursing Facility1

## 2021-04-23 DIAGNOSIS — E1122 Type 2 diabetes mellitus with diabetic chronic kidney disease: Secondary | ICD-10-CM | POA: Insufficient documentation

## 2021-04-23 DIAGNOSIS — E119 Type 2 diabetes mellitus without complications: Secondary | ICD-10-CM

## 2021-04-23 DIAGNOSIS — Z713 Dietary counseling and surveillance: Secondary | ICD-10-CM | POA: Insufficient documentation
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
## 2021-04-28 ENCOUNTER — Ambulatory Visit: Payer: Medicare HMO | Admitting: Skilled Nursing Facility1

## 2021-04-28 DIAGNOSIS — G4733 Obstructive sleep apnea (adult) (pediatric): Secondary | ICD-10-CM | POA: Diagnosis not present

## 2021-05-19 ENCOUNTER — Ambulatory Visit: Payer: Medicare HMO | Admitting: Skilled Nursing Facility1

## 2021-08-27 ENCOUNTER — Encounter: Payer: Self-pay | Admitting: Internal Medicine
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
## 2021-08-28 ENCOUNTER — Ambulatory Visit (INDEPENDENT_AMBULATORY_CARE_PROVIDER_SITE_OTHER): Payer: Medicare Other | Admitting: Internal Medicine

## 2021-08-28 ENCOUNTER — Ambulatory Visit (INDEPENDENT_AMBULATORY_CARE_PROVIDER_SITE_OTHER): Payer: Medicare Other

## 2021-08-28 ENCOUNTER — Encounter: Payer: Self-pay | Admitting: Internal Medicine

## 2021-08-28 VITALS — BP 138/80 | HR 74 | Ht 64.0 in | Wt 254.0 lb

## 2021-08-28 DIAGNOSIS — G4733 Obstructive sleep apnea (adult) (pediatric): Secondary | ICD-10-CM

## 2021-08-28 DIAGNOSIS — J302 Other seasonal allergic rhinitis: Secondary | ICD-10-CM

## 2021-08-28 DIAGNOSIS — J3089 Other allergic rhinitis: Secondary | ICD-10-CM

## 2021-08-28 DIAGNOSIS — J4520 Mild intermittent asthma, uncomplicated: Secondary | ICD-10-CM | POA: Diagnosis not present

## 2021-08-28 MED ORDER — TRELEGY ELLIPTA 100-62.5-25 MCG/ACT IN AEPB: 1.0000 | INHALATION_SPRAY | Freq: Every day | RESPIRATORY_TRACT | 0 refills | Status: DC

## 2021-08-28 NOTE — Assessment & Plan Note (Signed)
Increased cough over the past month, not very specific but may be related to her asthma history. Plan-sample trial of Trelegy, CXR

## 2021-08-28 NOTE — Assessment & Plan Note (Signed)
She has benefited from CPAP and continues good compliance and control.  Machine is due for replacement. Plan-replace old CPAP machine, change to AutoPap 5-20

## 2021-08-28 NOTE — Assessment & Plan Note (Signed)
Nasal stuffiness and mucus now.  I do not think she has an active sinus infection. Plan-explained regular maintenance use of Flonase for a few weeks to see if that helps her clear.  Can also use nasal saline.  Okay sparing, conservative use of a decongestant nasal spray like Afrin if needed.

## 2021-08-28 NOTE — Patient Instructions (Signed)
Order- DME [facility]- please replace old CPAP machine change to autopap 5-20, mask of choice, humidifier, supplies, Airview/ card  Order- CXR   dx Cough  Order- sample Trelegy 100      inhale 1 puff, once daily then rinse mouth  Please call if we can help

## 2021-09-11 ENCOUNTER — Other Ambulatory Visit: Payer: Self-pay | Admitting: Obstetrics and Gynecology

## 2021-09-11 DIAGNOSIS — N390 Urinary tract infection, site not specified: Secondary | ICD-10-CM

## 2021-10-04 ENCOUNTER — Emergency Department (HOSPITAL_COMMUNITY): Payer: Medicare Other

## 2021-10-04 ENCOUNTER — Other Ambulatory Visit: Payer: Self-pay

## 2021-10-04 ENCOUNTER — Emergency Department (HOSPITAL_COMMUNITY)
Admission: EM | Admit: 2021-10-04 | Discharge: 2021-10-04 | Disposition: A | Discharge status: Home / Self Care | Payer: Medicare Other | Attending: Emergency Medicine | Admitting: Emergency Medicine

## 2021-10-04 DIAGNOSIS — I129 Hypertensive chronic kidney disease with stage 1 through stage 4 chronic kidney disease, or unspecified chronic kidney disease: Secondary | ICD-10-CM | POA: Diagnosis not present

## 2021-10-04 DIAGNOSIS — Z7984 Long term (current) use of oral hypoglycemic drugs: Secondary | ICD-10-CM | POA: Diagnosis not present

## 2021-10-04 DIAGNOSIS — Z20822 Contact with and (suspected) exposure to covid-19: Secondary | ICD-10-CM | POA: Insufficient documentation

## 2021-10-04 DIAGNOSIS — R7989 Other specified abnormal findings of blood chemistry: Secondary | ICD-10-CM | POA: Insufficient documentation

## 2021-10-04 DIAGNOSIS — E1122 Type 2 diabetes mellitus with diabetic chronic kidney disease: Secondary | ICD-10-CM | POA: Insufficient documentation

## 2021-10-04 DIAGNOSIS — N189 Chronic kidney disease, unspecified: Secondary | ICD-10-CM | POA: Insufficient documentation

## 2021-10-04 DIAGNOSIS — R079 Chest pain, unspecified: Secondary | ICD-10-CM | POA: Diagnosis present

## 2021-10-04 DIAGNOSIS — R0789 Other chest pain: Secondary | ICD-10-CM | POA: Diagnosis not present

## 2021-10-04 LAB — CBC
HCT: 39.6 % (ref 36.0–46.0)
Hemoglobin: 12.9 g/dL (ref 12.0–15.0)
MCH: 29.7 pg (ref 26.0–34.0)
MCHC: 32.6 g/dL (ref 30.0–36.0)
MCV: 91 fL (ref 80.0–100.0)
Platelets: 237 10*3/uL (ref 150–400)
RBC: 4.35 MIL/uL (ref 3.87–5.11)
RDW: 13.2 % (ref 11.5–15.5)
WBC: 5.8 10*3/uL (ref 4.0–10.5)
nRBC: 0 % (ref 0.0–0.2)

## 2021-10-04 LAB — BASIC METABOLIC PANEL
Anion gap: 6 (ref 5–15)
BUN: 20 mg/dL (ref 8–23)
CO2: 27 mmol/L (ref 22–32)
Calcium: 9 mg/dL (ref 8.9–10.3)
Chloride: 106 mmol/L (ref 98–111)
Creatinine, Ser: 1.19 mg/dL — ABNORMAL HIGH (ref 0.44–1.00)
GFR, Estimated: 49 mL/min — ABNORMAL LOW (ref >60)
Glucose, Bld: 110 mg/dL — ABNORMAL HIGH (ref 70–99)
Potassium: 4.2 mmol/L (ref 3.5–5.1)
Sodium: 139 mmol/L (ref 135–145)

## 2021-10-04 LAB — TROPONIN I (HIGH SENSITIVITY)
Troponin I (High Sensitivity): 4 ng/L (ref <18)
Troponin I (High Sensitivity): 5 ng/L (ref <18)

## 2021-10-04 LAB — SARS CORONAVIRUS 2 BY RT PCR: SARS Coronavirus 2 by RT PCR: NEGATIVE

## 2021-10-04 LAB — D-DIMER, QUANTITATIVE: D-Dimer, Quant: 0.42 ug/mL-FEU (ref 0.00–0.50)

## 2021-10-04 MED ORDER — ONDANSETRON HCL 4 MG/2ML IJ SOLN
4.0000 mg | Freq: Once | INTRAMUSCULAR | Status: AC
  Administered 2021-10-04: 4 mg via INTRAVENOUS
  Filled 2021-10-04: qty 2

## 2021-10-04 MED ORDER — IOHEXOL 350 MG/ML SOLN
100.0000 mL | Freq: Once | INTRAVENOUS | Status: AC | PRN
  Administered 2021-10-04: 100 mL via INTRAVENOUS

## 2021-10-04 MED ORDER — SODIUM CHLORIDE 0.9 % IV BOLUS
500.0000 mL | Freq: Once | INTRAVENOUS | Status: AC
  Administered 2021-10-04: 500 mL via INTRAVENOUS

## 2021-10-04 MED ORDER — MORPHINE SULFATE (PF) 4 MG/ML IV SOLN
4.0000 mg | Freq: Once | INTRAVENOUS | Status: AC
  Administered 2021-10-04: 4 mg via INTRAVENOUS
  Filled 2021-10-04: qty 1

## 2021-10-04 MED ORDER — HYDROCODONE-ACETAMINOPHEN 5-325 MG PO TABS: 1.0000 | ORAL_TABLET | ORAL | 0 refills | Status: AC | PRN

## 2021-10-04 NOTE — Discharge Instructions (Signed)
You can also use otc lidocaine patches for the pain.

## 2021-10-04 NOTE — ED Provider Notes (Signed)
[facility] EMERGENCY DEPARTMENT Provider Note   CSN: [id number] Arrival date & time: 10/04/21  1039     History  Chief Complaint  Patient presents with   Chest Pain    Jocelyn Little is a 71 y.o. female.  Pt is a 71 yo female with a pmhx significant for HTN, high cholesterol, DM, GERD, OSA and CKD.  She said she has had left sided cp since around 2100.  Pt said it hurts with movement and with touch.  She denies sob. She's had a dry cough.  No n/v.  She has never had this sx in the past.  She did take an asa this am.       Home Medications Prior to Admission medications   Medication Sig Start Date End Date Taking? Authorizing Provider  HYDROcodone-acetaminophen (NORCO/VICODIN) 5-325 MG tablet Take 1 tablet by mouth every 4 (four) hours as needed. 10/04/21  Yes Isla Pence, MD  acetaminophen (TYLENOL) 650 MG CR tablet Take 1,300 mg by mouth every 8 (eight) hours as needed for pain.    [provider]  albuterol (PROVENTIL HFA;VENTOLIN HFA) 108 (90 Base) MCG/ACT inhaler Inhale 2 puffs into the lungs every 6 (six) hours as needed for wheezing. 08/11/17   Tereasa Coop, PA-C  amLODipine (NORVASC) 5 MG tablet TAKE 1 TABLET BY MOUTH DAILY Patient taking differently: Take 5 mg by mouth daily. 09/08/18   Horald Pollen, MD  blood glucose meter kit and supplies KIT Dispense based on patient and insurance preference. Use to check cbgs qd - fasting in the morning or 2 hours after a meal, E11.9 10/01/16   Tereasa Coop, PA-C  buPROPion (WELLBUTRIN XL) 150 MG 24 hr tablet Take 1 tablet (150 mg total) by mouth daily. 08/11/17   Tereasa Coop, PA-C  Cyanocobalamin 1000 MCG/ML LIQD Take 1 Dose by mouth 2 (two) times a week.    [provider]  cyclobenzaprine (FLEXERIL) 10 MG tablet Take 1 tablet (10 mg total) by mouth 2 (two) times daily as needed for muscle spasms. 01/25/21   Dorie Rank, MD  esomeprazole (NEXIUM) 40 MG capsule Take 1 capsule  (40 mg total) by mouth as needed (for indigestion). 08/11/17   Tereasa Coop, PA-C  fluticasone (FLONASE) 50 MCG/ACT nasal spray Place 1 spray into both nostrils daily as needed. Patient not taking: Reported on 08/28/2021 10/13/19   [provider]  Fluticasone-Umeclidin-Vilant (TRELEGY ELLIPTA) 100-62.5-25 MCG/ACT AEPB Inhale 1 puff into the lungs daily. 08/28/21   Deneise Lever, MD  Ibuprofen-diphenhydrAMINE Cit (ADVIL PM) 200-38 MG TABS Take 1 tablet by mouth at bedtime as needed (sleep).    [provider]  lovastatin (MEVACOR) 20 MG tablet Take 1 tablet (20 mg total) by mouth at bedtime. 09/08/18 12/07/18  Horald Pollen, MD  meloxicam (MOBIC) 7.5 MG tablet Take 1-2 tablets (7.5-15 mg total) by mouth daily. Patient taking differently: Take 7.5-15 mg by mouth daily as needed for pain. 08/11/17   Tereasa Coop, PA-C  metFORMIN (GLUCOPHAGE) 500 MG tablet Take 1 tablet (500 mg total) by mouth daily with breakfast. 08/11/17   Tereasa Coop, PA-C  metoprolol tartrate (LOPRESSOR) 50 MG tablet TAKE 1 TABLET BY MOUTH DAILY Patient taking differently: Take 50 mg by mouth at bedtime. 09/08/18   Horald Pollen, MD  mirabegron ER (MYRBETRIQ) 50 MG TB24 tablet Take 50 mg by mouth every evening.    [provider]  oxybutynin (DITROPAN-XL) 10 MG  24 hr tablet Take 10 mg by mouth at bedtime.    [provider]  oxyCODONE-acetaminophen (PERCOCET) 5-325 MG tablet Take 1 tablet by mouth every 8 (eight) hours as needed for severe pain. Patient not taking: Reported on 08/28/2021 03/31/20   Mesner, Corene Cornea, MD  vortioxetine HBr (TRINTELLIX) 20 MG TABS tablet Take 1 tablet (20 mg total) by mouth daily. 08/11/17   Tereasa Coop, PA-C  XIIDRA 5 % SOLN Place 1 drop into both eyes 2 (two) times daily. 03/27/20   [provider]      Allergies    Citrus, Dust mite extract, Naproxen, and Other    Review of Systems   Review of Systems  Cardiovascular:  Positive for  chest pain.  All other systems reviewed and are negative.   Physical Exam Updated Vital Signs BP (!) 160/78 (BP Location: Right Arm)   Pulse 64   Temp 98.8 F (37.1 C) (Oral)   Resp 18   SpO2 97%  Physical Exam Vitals and nursing note reviewed.  Constitutional:      Appearance: She is well-developed. She is obese.  HENT:     Head: Normocephalic and atraumatic.  Eyes:     Extraocular Movements: Extraocular movements intact.     Pupils: Pupils are equal, round, and reactive to light.  Cardiovascular:     Rate and Rhythm: Normal rate and regular rhythm.     Heart sounds: Normal heart sounds.  Pulmonary:     Effort: Pulmonary effort is normal.     Breath sounds: Normal breath sounds.  Chest:    Abdominal:     General: Bowel sounds are normal.     Palpations: Abdomen is soft.  Musculoskeletal:        General: Normal range of motion.     Cervical back: Normal range of motion and neck supple.  Skin:    General: Skin is warm.     Capillary Refill: Capillary refill takes less than 2 seconds.  Neurological:     General: No focal deficit present.     Mental Status: She is alert and oriented to person, place, and time.  Psychiatric:        Mood and Affect: Mood normal.        Behavior: Behavior normal.     ED Results / Procedures / Treatments   Labs (all labs ordered are listed, but only abnormal results are displayed) Labs Reviewed  BASIC METABOLIC PANEL - Abnormal; Notable for the following components:      Result Value   Glucose, Bld 110 (*)    Creatinine, Ser 1.19 (*)    GFR, Estimated 49 (*)    All other components within normal limits  SARS CORONAVIRUS 2 BY RT PCR  CBC  D-DIMER, QUANTITATIVE  TROPONIN I (HIGH SENSITIVITY)  TROPONIN I (HIGH SENSITIVITY)    EKG EKG Interpretation  Date/Time:  Saturday October 04 2021 10:54:15 EDT Ventricular Rate:  61 PR Interval:  162 QRS Duration: 80 QT Interval:  408 QTC Calculation: 410 R Axis:   38 Text  Interpretation: Normal sinus rhythm Nonspecific T wave abnormality Abnormal ECG When compared with ECG of 25-Jan-2021 11:41, PREVIOUS ECG IS PRESENT No significant change since last tracing Confirmed by Isla Pence [phone]) on 10/04/2021 11:29:43 AM  Radiology CT Angio Chest PE W and/or Wo Contrast  Result Date: 10/04/2021 CLINICAL DATA:  Left-sided pleuritic chest pain. Palpitations. High probability for pulmonary embolism. EXAM: CT ANGIOGRAPHY CHEST WITH CONTRAST TECHNIQUE: Multidetector CT imaging  of the chest was performed using the standard protocol during bolus administration of intravenous contrast. Multiplanar CT image reconstructions and MIPs were obtained to evaluate the vascular anatomy. RADIATION DOSE REDUCTION: This exam was performed according to the departmental dose-optimization program which includes automated exposure control, adjustment of the mA and/or kV according to patient size and/or use of iterative reconstruction technique. CONTRAST:  171m OMNIPAQUE IOHEXOL 350 MG/ML SOLN COMPARISON:  None Available. FINDINGS: Cardiovascular: Satisfactory opacification of pulmonary arteries noted, and no pulmonary emboli identified. No evidence of thoracic aortic dissection or aneurysm. Mediastinum/Nodes: No masses or pathologically enlarged lymph nodes identified. Lungs/Pleura: No pulmonary mass, infiltrate, or effusion. Upper abdomen: Moderate size hiatal hernia is seen. Musculoskeletal: No suspicious bone lesions identified. Review of the MIP images confirms the above findings. IMPRESSION: No evidence of pulmonary embolism or other acute findings. Moderate hiatal hernia. Electronically Signed   By: JMarlaine HindM.D.   On: 10/04/2021 13:39   DG Chest 2 View  Result Date: 10/04/2021 CLINICAL DATA:  71year old female with left side chest pain and nonproductive cough for 1 month. EXAM: CHEST - 2 VIEW COMPARISON:  Chest radiographs 08/28/2021 and earlier. FINDINGS: Lung volumes and mediastinal  contours remain normal. Visualized tracheal air column is within normal limits. Lungs appear stable and clear. No pneumothorax or pleural effusion. No acute osseous abnormality identified. Chronic thoracic endplate spurring. Negative visible bowel gas. IMPRESSION: No acute cardiopulmonary abnormality. Electronically Signed   By: HGenevie AnnM.D.   On: 10/04/2021 11:31    Procedures Procedures    Medications Ordered in ED Medications  ondansetron (ZOFRAN) injection 4 mg (4 mg Intravenous Given 10/04/21 1311)  sodium chloride 0.9 % bolus 500 mL (0 mLs Intravenous Stopped 10/04/21 1424)  morphine (PF) 4 MG/ML injection 4 mg (4 mg Intravenous Given 10/04/21 1311)  iohexol (OMNIPAQUE) 350 MG/ML injection 100 mL (100 mLs Intravenous Contrast Given 10/04/21 1322)    ED Course/ Medical Decision Making/ A&P                           Medical Decision Making Amount and/or Complexity of Data Reviewed Labs: ordered. Radiology: ordered.  Risk Prescription drug management.   This patient presents to the ED for concern of cp, this involves an extensive number of treatment options, and is a complaint that carries with it a high risk of complications and morbidity.  The differential diagnosis includes cardiac, pulm, gi   Co morbidities that complicate the patient evaluation  HTN, high cholesterol, DM, GERD, OSA and CKD   Additional history obtained:  Additional history obtained from epic chart review External records from outside source obtained and reviewed including husband   Lab Tests:  I Ordered, and personally interpreted labs.  The pertinent results include:  cbc nl, bmp nl other than Cr slightly elevated at 1.19 (chronic for pt); ddimer neg; covid neg, trop neg times 2   Imaging Studies ordered:  I ordered imaging studies including cxr and ct chest I independently visualized and interpreted imaging which showed  CXR: IMPRESSION:  No acute cardiopulmonary abnormality.  CT  chest: IMPRESSION:  No evidence of pulmonary embolism or other acute findings.   I agree with the radiologist interpretation   Cardiac Monitoring:  The patient was maintained on a cardiac monitor.  I personally viewed and interpreted the cardiac monitored which showed an underlying rhythm of: nsr   Medicines ordered and prescription drug management:  I ordered medication including morphine  for pain  Reevaluation of the patient after these medicines showed that the patient improved I have reviewed the patients home medicines and have made adjustments as needed   Test Considered:  Ct chest   Critical Interventions:  Pain control    Problem List / ED Course:  CP:  pain is atypical.  It hurts with movement or deep breaths.  CTA neg for PE or pna.  EKG and troponins neg.  Pt is feeling better.  She is stable for d/c.  She is instructed to return if worse.  F/u with pcp.   Reevaluation:  After the interventions noted above, I reevaluated the patient and found that they have :improved   Social Determinants of Health:  Lives at home with husband   Dispostion:  After consideration of the diagnostic results and the patients response to treatment, I feel that the patent would benefit from discharge with outpatient f/u.          Final Clinical Impression(s) / ED Diagnoses Final diagnoses:  Atypical chest pain    Rx / DC Orders ED Discharge Orders          Ordered    HYDROcodone-acetaminophen (NORCO/VICODIN) 5-325 MG tablet  Every 4 hours PRN        10/04/21 1447              Isla Pence, MD 10/04/21 1453

## 2021-10-04 NOTE — ED Triage Notes (Signed)
Pt with central/L sided chest pain with radiation to L arm and neck onset last night. Dull pain has been constant since onset but has intermittent sharp pain, worse with movement, inspiration, and palpation.

## 2021-12-29 ENCOUNTER — Encounter: Payer: Self-pay | Admitting: *Deleted
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
## 2022-07-02 ENCOUNTER — Other Ambulatory Visit (HOSPITAL_BASED_OUTPATIENT_CLINIC_OR_DEPARTMENT_OTHER): Payer: Self-pay

## 2022-07-02 MED ORDER — MOUNJARO 7.5 MG/0.5ML ~~LOC~~ SOAJ
7.5000 mg | SUBCUTANEOUS | 3 refills | Status: AC
  Filled 2022-07-02: qty 2, 28d supply, fill #0

## 2022-07-24 ENCOUNTER — Other Ambulatory Visit (HOSPITAL_BASED_OUTPATIENT_CLINIC_OR_DEPARTMENT_OTHER): Payer: Self-pay

## 2022-07-24 MED ORDER — MOUNJARO 7.5 MG/0.5ML ~~LOC~~ SOAJ
7.5000 mg | SUBCUTANEOUS | 5 refills | Status: AC
  Filled 2022-07-24 – 2022-08-05 (×2): qty 2, 28d supply, fill #0
  Filled 2022-08-28: qty 2, 28d supply, fill #1
  Filled 2022-09-28 (×2): qty 2, 28d supply, fill #2

## 2022-07-28 ENCOUNTER — Other Ambulatory Visit (HOSPITAL_BASED_OUTPATIENT_CLINIC_OR_DEPARTMENT_OTHER): Payer: Self-pay

## 2022-08-04 ENCOUNTER — Other Ambulatory Visit (HOSPITAL_BASED_OUTPATIENT_CLINIC_OR_DEPARTMENT_OTHER): Payer: Self-pay

## 2022-08-05 ENCOUNTER — Other Ambulatory Visit (HOSPITAL_BASED_OUTPATIENT_CLINIC_OR_DEPARTMENT_OTHER): Payer: Self-pay

## 2022-08-28 ENCOUNTER — Other Ambulatory Visit (HOSPITAL_BASED_OUTPATIENT_CLINIC_OR_DEPARTMENT_OTHER): Payer: Self-pay

## 2022-09-28 ENCOUNTER — Other Ambulatory Visit: Payer: Self-pay

## 2022-09-28 ENCOUNTER — Other Ambulatory Visit (HOSPITAL_BASED_OUTPATIENT_CLINIC_OR_DEPARTMENT_OTHER): Payer: Self-pay

## 2022-09-29 ENCOUNTER — Other Ambulatory Visit (HOSPITAL_BASED_OUTPATIENT_CLINIC_OR_DEPARTMENT_OTHER): Payer: Self-pay

## 2022-09-29 MED ORDER — MOUNJARO 10 MG/0.5ML ~~LOC~~ SOAJ
10.0000 mg | SUBCUTANEOUS | 5 refills | Status: DC
  Filled 2022-09-29: qty 2, 28d supply, fill #0
  Filled 2022-10-29: qty 2, 28d supply, fill #1
  Filled 2022-11-25: qty 2, 28d supply, fill #2
  Filled 2022-12-24: qty 2, 28d supply, fill #3
  Filled 2023-01-19: qty 2, 28d supply, fill #4
  Filled 2023-02-10: qty 2, 28d supply, fill #5

## 2022-10-11 ENCOUNTER — Emergency Department (HOSPITAL_COMMUNITY)
Admission: EM | Admit: 2022-10-11 | Discharge: 2022-10-11 | Disposition: A | Discharge status: Home / Self Care | Payer: 59 | Source: Home / Self Care | Attending: Emergency Medicine | Admitting: Emergency Medicine

## 2022-10-11 ENCOUNTER — Other Ambulatory Visit: Payer: Self-pay

## 2022-10-11 ENCOUNTER — Encounter (HOSPITAL_COMMUNITY): Payer: Self-pay

## 2022-10-11 ENCOUNTER — Emergency Department (HOSPITAL_COMMUNITY): Payer: 59

## 2022-10-11 DIAGNOSIS — Z79899 Other long term (current) drug therapy: Secondary | ICD-10-CM | POA: Insufficient documentation

## 2022-10-11 DIAGNOSIS — Z7984 Long term (current) use of oral hypoglycemic drugs: Secondary | ICD-10-CM | POA: Insufficient documentation

## 2022-10-11 DIAGNOSIS — E119 Type 2 diabetes mellitus without complications: Secondary | ICD-10-CM | POA: Diagnosis not present

## 2022-10-11 DIAGNOSIS — R079 Chest pain, unspecified: Secondary | ICD-10-CM | POA: Diagnosis present

## 2022-10-11 LAB — D-DIMER, QUANTITATIVE: D-Dimer, Quant: <0.27 ug/mL-FEU (ref 0.00–0.50)

## 2022-10-11 LAB — CBC
HCT: 39.6 % (ref 36.0–46.0)
Hemoglobin: 12.8 g/dL (ref 12.0–15.0)
MCH: 30.3 pg (ref 26.0–34.0)
MCHC: 32.3 g/dL (ref 30.0–36.0)
MCV: 93.8 fL (ref 80.0–100.0)
Platelets: 201 10*3/uL (ref 150–400)
RBC: 4.22 MIL/uL (ref 3.87–5.11)
RDW: 13 % (ref 11.5–15.5)
WBC: 5.1 10*3/uL (ref 4.0–10.5)
nRBC: 0 % (ref 0.0–0.2)

## 2022-10-11 LAB — BASIC METABOLIC PANEL
Anion gap: 8 (ref 5–15)
BUN: 13 mg/dL (ref 8–23)
CO2: 28 mmol/L (ref 22–32)
Calcium: 8.9 mg/dL (ref 8.9–10.3)
Chloride: 104 mmol/L (ref 98–111)
Creatinine, Ser: 1.21 mg/dL — ABNORMAL HIGH (ref 0.44–1.00)
GFR, Estimated: 48 mL/min — ABNORMAL LOW (ref >60)
Glucose, Bld: 89 mg/dL (ref 70–99)
Potassium: 4.1 mmol/L (ref 3.5–5.1)
Sodium: 140 mmol/L (ref 135–145)

## 2022-10-11 LAB — CK: Total CK: 82 U/L (ref 38–234)

## 2022-10-11 LAB — TROPONIN I (HIGH SENSITIVITY)
Troponin I (High Sensitivity): 3 ng/L (ref <18)
Troponin I (High Sensitivity): 4 ng/L (ref <18)

## 2022-10-11 MED ORDER — METHOCARBAMOL 500 MG PO TABS
500.0000 mg | ORAL_TABLET | Freq: Once | ORAL | Status: AC
  Administered 2022-10-11: 500 mg via ORAL
  Filled 2022-10-11: qty 1

## 2022-10-11 MED ORDER — METHOCARBAMOL 500 MG PO TABS: 500.0000 mg | ORAL_TABLET | Freq: Two times a day (BID) | ORAL | 0 refills | Status: AC

## 2022-10-11 MED ORDER — METHOCARBAMOL 500 MG PO TABS: 500.0000 mg | ORAL_TABLET | Freq: Two times a day (BID) | ORAL | 0 refills | Status: DC

## 2022-10-11 NOTE — Discharge Instructions (Addendum)
All the blood work looks good today.  Your kidneys look good, no signs of heart attack or blood clots.  This is most likely an issue with your muscles and bones.  You can try Voltaren gel which she can rub on the area that hurts and lidocaine patches but also you were given a muscle relaxer.  If your pain does not improve he should follow-up with your doctor.  However if you develop passing out, difficulty catching your breath, fever or vomiting you should return to the emergency room.

## 2022-10-11 NOTE — ED Provider Notes (Signed)
[place] EMERGENCY DEPARTMENT AT [facility] Provider Note   CSN: [id number] Arrival date & time: 10/11/22  1400     History  Chief Complaint  Patient presents with   Chest Pain    Jocelyn Little is a 72 y.o. female.  Patient is a 72 year old female with a history of hypertension, diabetes, hyperlipidemia, sleep apnea who is presenting today with complaint of chest pain.  She reports the pain started approximately 4 days ago and has been constant but will have waves of severe pain.  She initially reports it started in her left arm and then moved into her left upper back and chest area.  She reports there is always a dull ache but with certain movements the pain becomes severe.  It usually sitting up, taking deep breaths or twisting a certain way.  She has not had cough, shortness of breath, nausea or vomiting.  Eating does not seem to affect the pain.  She has not noticed any new symptoms in her lower extremities but reports sometimes even the pain moves over to her right arm.  She denies any falls or trauma.  She does report she had something similar about a year ago and they told her everything with her heart was normal but she did get a muscle relaxer which seemed to help her symptoms.  She denies any new activities and did start Mounjaro a few months ago and has increased the dose gradually but otherwise all of her other medications have been chronic.  She has no history of PE DVT and none run in the family.  She does have significant family history of her mom and brother dying of MIs.  She denies tobacco use, drugs or significant alcohol.  She will occasionally have some wine.  The history is provided by the patient and medical records.  Chest Pain      Home Medications Prior to Admission medications   Medication Sig Start Date End Date Taking? Authorizing Provider  acetaminophen (TYLENOL) 650 MG CR tablet Take 1,300 mg by mouth every 8 (eight) hours as needed for  pain.    [provider]  albuterol (PROVENTIL HFA;VENTOLIN HFA) 108 (90 Base) MCG/ACT inhaler Inhale 2 puffs into the lungs every 6 (six) hours as needed for wheezing. 08/11/17   Ofilia Neas, PA-C  amLODipine (NORVASC) 5 MG tablet TAKE 1 TABLET BY MOUTH DAILY Patient taking differently: Take 5 mg by mouth daily. 09/08/18   Georgina Quint, MD  blood glucose meter kit and supplies KIT Dispense based on patient and insurance preference. Use to check cbgs qd - fasting in the morning or 2 hours after a meal, E11.9 10/01/16   Ofilia Neas, PA-C  buPROPion (WELLBUTRIN XL) 150 MG 24 hr tablet Take 1 tablet (150 mg total) by mouth daily. 08/11/17   Ofilia Neas, PA-C  Cyanocobalamin 1000 MCG/ML LIQD Take 1 Dose by mouth 2 (two) times a week.    [provider]  cyclobenzaprine (FLEXERIL) 10 MG tablet Take 1 tablet (10 mg total) by mouth 2 (two) times daily as needed for muscle spasms. 01/25/21   Linwood Dibbles, MD  esomeprazole (NEXIUM) 40 MG capsule Take 1 capsule (40 mg total) by mouth as needed (for indigestion). 08/11/17   Ofilia Neas, PA-C  fluticasone (FLONASE) 50 MCG/ACT nasal spray Place 1 spray into both nostrils daily as needed. Patient not taking: Reported on 08/28/2021 10/13/19   [provider]  Fluticasone-Umeclidin-Vilant (TRELEGY ELLIPTA) 100-62.5-25  MCG/ACT AEPB Inhale 1 puff into the lungs daily. 08/28/21   Waymon Budge, MD  HYDROcodone-acetaminophen (NORCO/VICODIN) 5-325 MG tablet Take 1 tablet by mouth every 4 (four) hours as needed. 10/04/21   Jacalyn Lefevre, MD  Ibuprofen-diphenhydrAMINE Cit (ADVIL PM) 200-38 MG TABS Take 1 tablet by mouth at bedtime as needed (sleep).    [provider]  lovastatin (MEVACOR) 20 MG tablet Take 1 tablet (20 mg total) by mouth at bedtime. 09/08/18 12/07/18  Georgina Quint, MD  meloxicam (MOBIC) 7.5 MG tablet Take 1-2 tablets (7.5-15 mg total) by mouth daily. Patient taking differently: Take 7.5-15 mg by  mouth daily as needed for pain. 08/11/17   Ofilia Neas, PA-C  metFORMIN (GLUCOPHAGE) 500 MG tablet Take 1 tablet (500 mg total) by mouth daily with breakfast. 08/11/17   Ofilia Neas, PA-C  metoprolol tartrate (LOPRESSOR) 50 MG tablet TAKE 1 TABLET BY MOUTH DAILY Patient taking differently: Take 50 mg by mouth at bedtime. 09/08/18   Georgina Quint, MD  mirabegron ER (MYRBETRIQ) 50 MG TB24 tablet Take 50 mg by mouth every evening.    [provider]  oxybutynin (DITROPAN-XL) 10 MG 24 hr tablet Take 10 mg by mouth at bedtime.    [provider]  oxyCODONE-acetaminophen (PERCOCET) 5-325 MG tablet Take 1 tablet by mouth every 8 (eight) hours as needed for severe pain. Patient not taking: Reported on 08/28/2021 03/31/20   Mesner, Barbara Cower, MD  tirzepatide [facility]) 10 MG/0.5ML Pen Inject 10 mg into the skin once a week. 09/29/22     tirzepatide (MOUNJARO) 7.5 MG/0.5ML Pen Inject 7.5 mg into the skin once a week. 07/02/22     tirzepatide (MOUNJARO) 7.5 MG/0.5ML Pen Inject 7.5 mg into the skin once a week. 07/24/22     vortioxetine HBr (TRINTELLIX) 20 MG TABS tablet Take 1 tablet (20 mg total) by mouth daily. 08/11/17   Ofilia Neas, PA-C  XIIDRA 5 % SOLN Place 1 drop into both eyes 2 (two) times daily. 03/27/20   [provider]      Allergies    Citrus, Dust mite extract, Naproxen, and Other    Review of Systems   Review of Systems  Cardiovascular:  Positive for chest pain.    Physical Exam Updated Vital Signs BP (!) 150/71   Pulse 74   Temp (!) 97.5 F (36.4 C) (Oral)   Resp 17   Ht 5\' 4"  (1.626 m)   Wt 98 kg   SpO2 96%   BMI 37.08 kg/m  Physical Exam Vitals and nursing note reviewed.  Constitutional:      General: She is not in acute distress.    Appearance: She is well-developed.  HENT:     Head: Normocephalic and atraumatic.  Eyes:     Pupils: Pupils are equal, round, and reactive to light.  Neck:   Cardiovascular:     Rate and Rhythm:  Normal rate and regular rhythm.     Pulses: Normal pulses.     Heart sounds: Normal heart sounds. No murmur heard.    No friction rub.  Pulmonary:     Effort: Pulmonary effort is normal.     Breath sounds: Normal breath sounds. No wheezing or rales.  Abdominal:     General: Bowel sounds are normal. There is no distension.     Palpations: Abdomen is soft.     Tenderness: There is no abdominal tenderness. There is no guarding or rebound.  Musculoskeletal:  General: No tenderness. Normal range of motion.     Right lower leg: No edema.     Left lower leg: No edema.     Comments: No edema  Skin:    General: Skin is warm and dry.     Findings: No rash.  Neurological:     Mental Status: She is alert and oriented to person, place, and time. Mental status is at baseline.     Cranial Nerves: No cranial nerve deficit.     Motor: No weakness.  Psychiatric:        Behavior: Behavior normal.     ED Results / Procedures / Treatments   Labs (all labs ordered are listed, but only abnormal results are displayed) Labs Reviewed  CBC  BASIC METABOLIC PANEL  D-DIMER, QUANTITATIVE  TROPONIN I (HIGH SENSITIVITY)    EKG EKG Interpretation Date/Time:  Sunday October 11 2022 14:08:38 EDT Ventricular Rate:  67 PR Interval:  150 QRS Duration:  80 QT Interval:  400 QTC Calculation: 422 R Axis:   20  Text Interpretation: Normal sinus rhythm Normal ECG When compared with ECG of 04-Oct-2021 10:54, PREVIOUS ECG IS PRESENT No significant change since last tracing Confirmed by Gwyneth Sprout ([id number]) on 10/11/2022 3:31:32 PM  Radiology No results found.  Procedures Procedures    Medications Ordered in ED Medications - No data to display  ED Course/ Medical Decision Making/ A&P                                 Medical Decision Making Amount and/or Complexity of Data Reviewed External Data Reviewed: notes. Labs: ordered. Decision-making details documented in ED Course. Radiology:  ordered and independent interpretation performed. Decision-making details documented in ED Course. ECG/medicine tests: ordered and independent interpretation performed. Decision-making details documented in ED Course.  Risk Prescription drug management.   Pt with multiple medical problems and comorbidities and presenting today with a complaint that caries a high risk for morbidity and mortality.  Here today with complaints of left arm pain chest and back pain.  Suspicion for most likely musculoskeletal etiology versus atypical ACS versus PE versus pneumothorax.  Low suspicion for pericardial effusion/tamponade, pericarditis, myocarditis.  Low suspicion for pneumonia or GI pathology. I independently interpreted patient's labs and EKG.  EKG is within normal limits,labs with a normal CK, troponin, D-dimer, BMP, CBC. I have independently visualized and interpreted pt's images today.  Chest x-ray within normal limits. Patient given muscle relaxer.  Blood pressure is mildly elevated 150/71 but low suspicion for dissection at this time as all pulses are equal patient is well-appearing and history is not suggestive of dissection.  Findings discussed with the patient.  At this time most likely musculoskeletal.  No indication for further testing or admission at this time.  Patient will follow-up with her PCP and given course of muscle relaxers.  She and her family member are comfortable with this plan and she was given return precautions.          Final Clinical Impression(s) / ED Diagnoses Final diagnoses:  None    Rx / DC Orders ED Discharge Orders     None         Gwyneth Sprout, MD 10/11/22 1843

## 2022-10-11 NOTE — ED Triage Notes (Signed)
Pt came in via POV d/t intermittent CP that started a few days ago on the Lt side that radiates to her Lt arm & back. A/Ox4, rates her pain 10/10 when its bad. Hx of HTN, denies difficulty breathing.

## 2022-10-12 ENCOUNTER — Other Ambulatory Visit (HOSPITAL_BASED_OUTPATIENT_CLINIC_OR_DEPARTMENT_OTHER): Payer: Self-pay

## 2022-10-29 ENCOUNTER — Other Ambulatory Visit (HOSPITAL_BASED_OUTPATIENT_CLINIC_OR_DEPARTMENT_OTHER): Payer: Self-pay

## 2022-11-15 ENCOUNTER — Encounter (HOSPITAL_COMMUNITY): Payer: Self-pay

## 2022-11-15 ENCOUNTER — Emergency Department (HOSPITAL_COMMUNITY)
Admission: EM | Admit: 2022-11-15 | Discharge: 2022-11-15 | Disposition: A | Discharge status: Home / Self Care | Payer: 59 | Attending: Emergency Medicine | Admitting: Emergency Medicine

## 2022-11-15 ENCOUNTER — Other Ambulatory Visit: Payer: Self-pay

## 2022-11-15 ENCOUNTER — Emergency Department (HOSPITAL_COMMUNITY): Payer: 59

## 2022-11-15 DIAGNOSIS — Z7984 Long term (current) use of oral hypoglycemic drugs: Secondary | ICD-10-CM | POA: Insufficient documentation

## 2022-11-15 DIAGNOSIS — W540XXA Bitten by dog, initial encounter: Secondary | ICD-10-CM | POA: Diagnosis not present

## 2022-11-15 DIAGNOSIS — E119 Type 2 diabetes mellitus without complications: Secondary | ICD-10-CM | POA: Insufficient documentation

## 2022-11-15 DIAGNOSIS — S6991XA Unspecified injury of right wrist, hand and finger(s), initial encounter: Secondary | ICD-10-CM | POA: Diagnosis present

## 2022-11-15 DIAGNOSIS — S61250A Open bite of right index finger without damage to nail, initial encounter: Secondary | ICD-10-CM | POA: Insufficient documentation

## 2022-11-15 MED ORDER — HYDROCODONE-ACETAMINOPHEN 5-325 MG PO TABS
1.0000 | ORAL_TABLET | Freq: Once | ORAL | Status: AC
  Administered 2022-11-15: 1 via ORAL
  Filled 2022-11-15: qty 1

## 2022-11-15 MED ORDER — TETANUS-DIPHTH-ACELL PERTUSSIS 5-2.5-18.5 LF-MCG/0.5 IM SUSY: 0.5000 mL | PREFILLED_SYRINGE | Freq: Once | INTRAMUSCULAR | Status: DC

## 2022-11-15 MED ORDER — AMOXICILLIN-POT CLAVULANATE 875-125 MG PO TABS: 1.0000 | ORAL_TABLET | Freq: Two times a day (BID) | ORAL | 0 refills | Status: AC

## 2022-11-15 NOTE — Discharge Instructions (Addendum)
The x-ray of your right finger did not show any fracture or dislocation.  We did not update your tetanus immunization on today's visit as your last time was in 2019.  You will go home on a short course of antibiotics to help with prophylactic treatment of infection.  Please take 1 tablet twice a day for the next 7 days.

## 2022-11-15 NOTE — ED Notes (Signed)
Pt soaking finger. NAD noted at this time. Support person at bedside.

## 2022-11-15 NOTE — ED Notes (Signed)
Provider at bedside

## 2022-11-15 NOTE — ED Provider Triage Note (Signed)
Emergency Medicine Provider Triage Evaluation Note  Jocelyn Little , a 72 y.o. female  was evaluated in triage.  Pt complains of R index finger pain after getting acrylic nails 3 days ago.  This morning her dog bit that same finger.  Last tetanus shot was in 2019.  Review of Systems  Positive: As above Negative: As above  Physical Exam  BP (!) 152/72 (BP Location: Right Arm)   Pulse 71   Temp 98.2 F (36.8 C)   Resp 18   Ht 5\' 4"  (1.626 m)   Wt 97.1 kg   SpO2 97%   BMI 36.73 kg/m  Gen:   Awake, no distress   Resp:  Normal effort  MSK:   Moves extremities without difficulty  Other:    Medical Decision Making  Medically screening exam initiated at 2:21 PM.  Appropriate orders placed.  Jocelyn Little was informed that the remainder of the evaluation will be completed by another provider, this initial triage assessment does not replace that evaluation, and the importance of remaining in the ED until their evaluation is complete.     Jocelyn Little, [place] 11/15/22 1427

## 2022-11-15 NOTE — ED Provider Notes (Signed)
[place] EMERGENCY DEPARTMENT AT [facility] Provider Note   CSN: [id number] Arrival date & time: 11/15/22  1351     History  Chief Complaint  Patient presents with   Finger Injury    Jocelyn Little is a 72 y.o. female.  72 year old female with a past medical history of diabetes presents to the ED with a chief complaint of right index pain status post injury.  Patient reports she got acrylic nails done 3 days ago, was having some swelling to the DIP.  Reports her dog actually bit the same finger this morning, she is having pain to the area especially with flexion, finger this appears swollen.  She has not placed any medication for improvement in symptoms.  She denies any systemic signs such as fever or chills.  Her last sinus immunization according to records was in 2019.  The history is provided by the patient.       Home Medications Prior to Admission medications   Medication Sig Start Date End Date Taking? Authorizing Provider  amoxicillin-clavulanate (AUGMENTIN) 875-125 MG tablet Take 1 tablet by mouth 2 (two) times daily for 7 days. 11/15/22 11/22/22 Yes Euphemia Lingerfelt, Leonie Douglas, PA-C  acetaminophen (TYLENOL) 650 MG CR tablet Take 1,300 mg by mouth every 8 (eight) hours as needed for pain.    [provider]  albuterol (PROVENTIL HFA;VENTOLIN HFA) 108 (90 Base) MCG/ACT inhaler Inhale 2 puffs into the lungs every 6 (six) hours as needed for wheezing. 08/11/17   Ofilia Neas, PA-C  amLODipine (NORVASC) 5 MG tablet TAKE 1 TABLET BY MOUTH DAILY Patient taking differently: Take 5 mg by mouth daily. 09/08/18   Georgina Quint, MD  blood glucose meter kit and supplies KIT Dispense based on patient and insurance preference. Use to check cbgs qd - fasting in the morning or 2 hours after a meal, E11.9 10/01/16   Ofilia Neas, PA-C  buPROPion (WELLBUTRIN XL) 150 MG 24 hr tablet Take 1 tablet (150 mg total) by mouth daily. 08/11/17   Ofilia Neas, PA-C  Cyanocobalamin  1000 MCG/ML LIQD Take 1 Dose by mouth 2 (two) times a week.    [provider]  cyclobenzaprine (FLEXERIL) 10 MG tablet Take 1 tablet (10 mg total) by mouth 2 (two) times daily as needed for muscle spasms. 01/25/21   Linwood Dibbles, MD  esomeprazole (NEXIUM) 40 MG capsule Take 1 capsule (40 mg total) by mouth as needed (for indigestion). 08/11/17   Ofilia Neas, PA-C  fluticasone (FLONASE) 50 MCG/ACT nasal spray Place 1 spray into both nostrils daily as needed. Patient not taking: Reported on 08/28/2021 10/13/19   [provider]  Fluticasone-Umeclidin-Vilant (TRELEGY ELLIPTA) 100-62.5-25 MCG/ACT AEPB Inhale 1 puff into the lungs daily. 08/28/21   Waymon Budge, MD  HYDROcodone-acetaminophen (NORCO/VICODIN) 5-325 MG tablet Take 1 tablet by mouth every 4 (four) hours as needed. 10/04/21   Jacalyn Lefevre, MD  Ibuprofen-diphenhydrAMINE Cit (ADVIL PM) 200-38 MG TABS Take 1 tablet by mouth at bedtime as needed (sleep).    [provider]  lovastatin (MEVACOR) 20 MG tablet Take 1 tablet (20 mg total) by mouth at bedtime. 09/08/18 12/07/18  Georgina Quint, MD  meloxicam (MOBIC) 7.5 MG tablet Take 1-2 tablets (7.5-15 mg total) by mouth daily. Patient taking differently: Take 7.5-15 mg by mouth daily as needed for pain. 08/11/17   Ofilia Neas, PA-C  metFORMIN (GLUCOPHAGE) 500 MG tablet Take 1 tablet (500 mg total) by mouth daily with breakfast.  08/11/17   Ofilia Neas, PA-C  methocarbamol (ROBAXIN) 500 MG tablet Take 1 tablet (500 mg total) by mouth 2 (two) times daily. 10/11/22   Gwyneth Sprout, MD  metoprolol tartrate (LOPRESSOR) 50 MG tablet TAKE 1 TABLET BY MOUTH DAILY Patient taking differently: Take 50 mg by mouth at bedtime. 09/08/18   Georgina Quint, MD  mirabegron ER (MYRBETRIQ) 50 MG TB24 tablet Take 50 mg by mouth every evening.    [provider]  oxybutynin (DITROPAN-XL) 10 MG 24 hr tablet Take 10 mg by mouth at bedtime.    [provider]   oxyCODONE-acetaminophen (PERCOCET) 5-325 MG tablet Take 1 tablet by mouth every 8 (eight) hours as needed for severe pain. Patient not taking: Reported on 08/28/2021 03/31/20   Mesner, Barbara Cower, MD  tirzepatide [facility]) 10 MG/0.5ML Pen Inject 10 mg into the skin once a week. 09/29/22     tirzepatide (MOUNJARO) 7.5 MG/0.5ML Pen Inject 7.5 mg into the skin once a week. 07/02/22     tirzepatide (MOUNJARO) 7.5 MG/0.5ML Pen Inject 7.5 mg into the skin once a week. 07/24/22     vortioxetine HBr (TRINTELLIX) 20 MG TABS tablet Take 1 tablet (20 mg total) by mouth daily. 08/11/17   Ofilia Neas, PA-C  XIIDRA 5 % SOLN Place 1 drop into both eyes 2 (two) times daily. 03/27/20   [provider]      Allergies    Citrus, Dust mite extract, Naproxen, and Other    Review of Systems   Review of Systems  Constitutional:  Negative for chills and fever.  Gastrointestinal:  Negative for abdominal pain.  Skin:  Positive for wound.    Physical Exam Updated Vital Signs BP (!) 155/64 (BP Location: Left Arm)   Pulse 97   Temp 99 F (37.2 C) (Oral)   Resp 18   Ht 5\' 4"  (1.626 m)   Wt 97.1 kg   SpO2 100%   BMI 36.73 kg/m  Physical Exam Vitals and nursing note reviewed.  Constitutional:      Appearance: Normal appearance.  HENT:     Head: Normocephalic and atraumatic.     Mouth/Throat:     Mouth: Mucous membranes are moist.  Cardiovascular:     Rate and Rhythm: Normal rate.  Pulmonary:     Effort: Pulmonary effort is normal.  Abdominal:     General: Abdomen is flat.  Musculoskeletal:     Cervical back: Normal range of motion and neck supple.  Skin:    General: Skin is warm.     Findings: Erythema present.     Comments: Surrounding erythema with a small superficial laceration to the right dorsum aspect of the finger.  Limited flexion due to pain, does appear swollen with good capillary refill.  2+ radial pulse.  Neurological:     Mental Status: She is alert and oriented to person, place,  and time.     ED Results / Procedures / Treatments   Labs (all labs ordered are listed, but only abnormal results are displayed) Labs Reviewed - No data to display  EKG None  Radiology DG Finger Index Right  Result Date: 11/15/2022 CLINICAL DATA:  Dog bite to the right index finger. EXAM: RIGHT INDEX FINGER 2+V COMPARISON:  None Available. FINDINGS: There is no acute fracture or dislocation. No significant arthritic changes. Mild soft tissue swelling of the index finger. No radiopaque foreign object/gas. IMPRESSION: No acute fracture or dislocation. Electronically Signed   By: Elgie Collard  M.D.   On: 11/15/2022 15:18    Procedures Procedures    Medications Ordered in ED Medications  HYDROcodone-acetaminophen (NORCO/VICODIN) 5-325 MG per tablet 1 tablet (has no administration in time range)    ED Course/ Medical Decision Making/ A&P                                 Medical Decision Making Risk Prescription drug management.    Patient here with right index injury that occurred after she was bit by her dog.  He is up-to-date with all vaccinations, her last immunization was in 2019.  Prior to this incident patient was having some swelling to the distal aspect, especially on the dorsum side, she reports she got acrylic nails 3 days ago and was trying to remove these.  She does have good range of motion, low-grade temp here of 99.  2+ radial pulses, sensation is intact and good capillary refill.  X-ray without any foreign body, dislocation or fracture.  With performance of wound care at the bedside, we discussed strict return precautions, patient is stable for discharge.   Portions of this note were generated with Scientist, clinical (histocompatibility and immunogenetics). Dictation errors may occur despite best attempts at proofreading.   Final Clinical Impression(s) / ED Diagnoses Final diagnoses:  Injury of finger of right hand, initial encounter    Rx / DC Orders ED Discharge Orders          Ordered     amoxicillin-clavulanate (AUGMENTIN) 875-125 MG tablet  2 times daily        11/15/22 1641              Claude Manges, PA-C 11/15/22 1657    Lonell Grandchild, MD 11/15/22 1907

## 2022-11-15 NOTE — ED Triage Notes (Signed)
Pt arrived POV from home stating she got her nails done a week ago and now her right pointer finger is swollen and tender to touch but then this morning her dog also bit that same finger and now she has a small puncture wound on the top of the finger as well.

## 2022-11-15 NOTE — ED Notes (Signed)
This RN reviewed discharge instructions with patient. She verbalized understanding and denied any further questions. PT well appearing upon discharge and reports tolerable pain. Pt ambulated with stable gait to exit. Pt endorses ride home.  

## 2022-11-25 ENCOUNTER — Other Ambulatory Visit (HOSPITAL_BASED_OUTPATIENT_CLINIC_OR_DEPARTMENT_OTHER): Payer: Self-pay

## 2022-12-24 ENCOUNTER — Other Ambulatory Visit (HOSPITAL_BASED_OUTPATIENT_CLINIC_OR_DEPARTMENT_OTHER): Payer: Self-pay

## 2023-01-19 ENCOUNTER — Other Ambulatory Visit (HOSPITAL_BASED_OUTPATIENT_CLINIC_OR_DEPARTMENT_OTHER): Payer: Self-pay

## 2023-02-10 ENCOUNTER — Other Ambulatory Visit (HOSPITAL_BASED_OUTPATIENT_CLINIC_OR_DEPARTMENT_OTHER): Payer: Self-pay

## 2023-03-19 ENCOUNTER — Other Ambulatory Visit (HOSPITAL_BASED_OUTPATIENT_CLINIC_OR_DEPARTMENT_OTHER): Payer: Self-pay

## 2023-03-19 MED ORDER — MOUNJARO 10 MG/0.5ML ~~LOC~~ SOAJ
10.0000 mg | SUBCUTANEOUS | 5 refills | Status: DC
  Filled 2023-03-19: qty 2, 28d supply, fill #0
  Filled 2023-04-15: qty 2, 28d supply, fill #1
  Filled 2023-05-13: qty 2, 28d supply, fill #2
  Filled 2023-06-04: qty 2, 28d supply, fill #3
  Filled 2023-07-07 – 2023-07-13 (×2): qty 2, 28d supply, fill #4
  Filled 2023-08-11: qty 2, 28d supply, fill #5

## 2023-03-23 ENCOUNTER — Other Ambulatory Visit (HOSPITAL_BASED_OUTPATIENT_CLINIC_OR_DEPARTMENT_OTHER): Payer: Self-pay

## 2023-04-15 ENCOUNTER — Other Ambulatory Visit (HOSPITAL_BASED_OUTPATIENT_CLINIC_OR_DEPARTMENT_OTHER): Payer: Self-pay

## 2023-05-13 ENCOUNTER — Other Ambulatory Visit (HOSPITAL_BASED_OUTPATIENT_CLINIC_OR_DEPARTMENT_OTHER): Payer: Self-pay

## 2023-06-04 ENCOUNTER — Other Ambulatory Visit (HOSPITAL_BASED_OUTPATIENT_CLINIC_OR_DEPARTMENT_OTHER): Payer: Self-pay

## 2023-07-05 ENCOUNTER — Ambulatory Visit: Attending: Cardiology | Admitting: Cardiology

## 2023-07-05 ENCOUNTER — Encounter: Payer: Self-pay | Admitting: Cardiology

## 2023-07-05 VITALS — BP 114/60 | HR 61 | Ht 64.0 in | Wt 197.4 lb

## 2023-07-05 DIAGNOSIS — Z01812 Encounter for preprocedural laboratory examination: Secondary | ICD-10-CM | POA: Diagnosis present

## 2023-07-05 DIAGNOSIS — R9431 Abnormal electrocardiogram [ECG] [EKG]: Secondary | ICD-10-CM | POA: Insufficient documentation

## 2023-07-05 DIAGNOSIS — R0600 Dyspnea, unspecified: Secondary | ICD-10-CM | POA: Diagnosis present

## 2023-07-05 DIAGNOSIS — R072 Precordial pain: Secondary | ICD-10-CM | POA: Insufficient documentation

## 2023-07-05 NOTE — Patient Instructions (Addendum)
 Medication Instructions:  Your physician recommends that you continue on your current medications as directed. Please refer to the Current Medication list given to you today.  *If you need a refill on your cardiac medications before your next appointment, please call your pharmacy*  Lab Work: BMET prior to Cardiac CT scan  If you have labs (blood work) drawn today and your tests are completely normal, you will receive your results only by: MyChart Message (if you have MyChart) OR A paper copy in the mail If you have any lab test that is abnormal or we need to change your treatment, we will call you to review the results.  Testing/Procedures: Your physician has requested that you have an echocardiogram. Echocardiography is a painless test that uses sound waves to create images of your heart. It provides your doctor with information about the size and shape of your heart and how well your heart's chambers and valves are working. This procedure takes approximately one hour. There are no restrictions for this procedure. Please do NOT wear cologne, perfume, aftershave, or lotions (deodorant is allowed). Please arrive 15 minutes prior to your appointment time.  Please note: We ask at that you not bring children with you during ultrasound (echo/ vascular) testing. Due to room size and safety concerns, children are not allowed in the ultrasound rooms during exams. Our front office staff cannot provide observation of children in our lobby area while testing is being conducted. An adult accompanying a patient to their appointment will only be allowed in the ultrasound room at the discretion of the ultrasound technician under special circumstances. We apologize for any inconvenience.  Your physician has requested that you have cardiac CT. Cardiac computed tomography (CT) is a painless test that uses an x-ray machine to take clear, detailed pictures of your heart. For further information please visit  [email or web address] Please follow instruction sheet as given.    Follow-Up: At [facility], you and your health needs are our priority.  As part of our continuing mission to provide you with exceptional heart care, our providers are all part of one team.  This team includes your primary Cardiologist (physician) and Advanced Practice Providers or APPs (Physician Assistants and Nurse Practitioners) who all work together to provide you with the care you need, when you need it.  Your next appointment:   To be determined after your testing  We recommend signing up for the patient portal called "MyChart".  Sign up information is provided on this After Visit Summary.  MyChart is used to connect with patients for Virtual Visits (Telemedicine).  Patients are able to view lab/test results, encounter notes, upcoming appointments, etc.  Non-urgent messages can be sent to your provider as well.   To learn more about what you can do with MyChart, go to [email or web address].   Other Instructions   Your cardiac CT will be scheduled at one of the below locations:    [facility] [address]  [place], [place] [address] Opening April 28, 202  Please follow these instructions carefully (unless otherwise directed):  An IV will be required for this test and Nitroglycerin will be given.  Hold all erectile dysfunction medications at least 3 days (72 hrs) prior to test. (Ie viagra, cialis, sildenafil, tadalafil, etc)   On the Night Before the Test: Be sure to Drink plenty of water. Do not consume any caffeinated/decaffeinated beverages or chocolate 12 hours prior to your test. Do not take any antihistamines 12  hours prior to your test.  On the Day of the Test: Drink plenty of water until 1 hour prior to the test. Do not eat any food 1 hour prior to test. You may take your regular medications prior to the test.  Take metoprolol  (Lopressor ) two hours prior to  test. If you take Furosemide /Hydrochlorothiazide/Spironolactone/Chlorthalidone, please HOLD on the morning of the test. Patients who wear a continuous glucose monitor MUST remove the device prior to scanning. FEMALES- please wear underwire-free bra if available, avoid dresses & tight clothing       After the Test: Drink plenty of water. After receiving IV contrast, you may experience a mild flushed feeling. This is normal. On occasion, you may experience a mild rash up to 24 hours after the test. This is not dangerous. If this occurs, you can take Benadryl 25 mg, Zyrtec, Claritin , or Allegra and increase your fluid intake. (Patients taking Tikosyn should avoid Benadryl, and may take Zyrtec, Claritin , or Allegra) If you experience trouble breathing, this can be serious. If it is severe call 911 IMMEDIATELY. If it is mild, please call our office.  We will call to schedule your test 2-4 weeks out understanding that some insurance companies will need an authorization prior to the service being performed.   For more information and frequently asked questions, please visit our website : [email or web address]  For non-scheduling related questions, please contact the cardiac imaging nurse navigator should you have any questions/concerns: Cardiac Imaging Nurse Navigators Direct Office Dial: [phone]   For scheduling needs, including cancellations and rescheduling, please call [place], [phone].

## 2023-07-05 NOTE — Progress Notes (Signed)
 Cardiology Office Note:  .   Date:  07/05/2023  ID:  Jocelyn Little, DOB 05/08/1950, MRN [id number] PCP: Azalia Leo, MD  [facility] Providers Cardiologist:  None     History of Present Illness: .   Jocelyn Little is a 73 y.o. female Discussed the use of AI scribe software for clinical note transcription with the patient, who gave verbal consent to proceed.  History of Present Illness Jocelyn Little is a 73 year old female with diabetes, hyperlipidemia, sleep apnea, and hypertension who presents for evaluation of an abnormal EKG. She was referred by Dr. Kelsey Patricia for evaluation of an EKG finding.  She previously visited the emergency department on October 11, 2022, with nonspecific chest pain. During that visit, she experienced constant waves of severe pain in the left arm, left upper back, and chest area, described as a dull ache. The pain was severe and persistent.  She reports ongoing pain in the left upper neck, shoulder, and arm, which worsens when lying down.  Her family history is significant for heart disease; her mother had a heart attack and passed away at 38, and her brother also died from a heart attack.  She is currently taking amlodipine  2.5 mg daily for hypertension, lovastatin  20 mg daily for hyperlipidemia, and metoprolol  50 mg daily. She also uses Mounjaro . No smoking history is noted.     Studies Reviewed: Aaron Aas   EKG Interpretation Date/Time:  Monday July 05 2023 14:38:45 EDT Ventricular Rate:  61 PR Interval:  146 QRS Duration:  80 QT Interval:  400 QTC Calculation: 402 R Axis:   15  Text Interpretation: Normal sinus rhythm Poor R wave progression When compared with ECG of 11-Oct-2022 14:08, No significant change was found Confirmed by Dorothye Gathers ([id number]) on 07/05/2023 2:46:01 PM    Results DIAGNOSTIC EKG: Poor R wave progression, possible old anterior infarct (10/11/2022) Risk Assessment/Calculations:            Physical Exam:   VS:   BP 114/60   Pulse 61   Ht 5\' 4"  (1.626 m)   Wt 197 lb 6.4 oz (89.5 kg)   SpO2 91%   BMI 33.88 kg/m    Wt Readings from Last 3 Encounters:  07/05/23 197 lb 6.4 oz (89.5 kg)  11/15/22 214 lb (97.1 kg)  10/11/22 216 lb (98 kg)    GEN: Well nourished, well developed in no acute distress NECK: No JVD; No carotid bruits CARDIAC: RRR, no murmurs, no rubs, no gallops RESPIRATORY:  Clear to auscultation without rales, wheezing or rhonchi  ABDOMEN: Soft, non-tender, non-distended EXTREMITIES:  No edema; No deformity   ASSESSMENT AND PLAN: .    Assessment and Plan Assessment & Plan Precordial chest pain/shortness of breath Intermittent precordial chest pain radiating to the left arm and upper back, severe and predominantly occurring when supine. Differential diagnosis includes cardiac causes such as myocardial ischemia or infarction, and non-cardiac causes such as musculoskeletal pain or rotator cuff issues. Given her risk factors, including family history of heart disease, diabetes, hyperlipidemia, and hypertension, further cardiac evaluation is warranted. - Order echocardiogram to assess cardiac function and check for old myocardial infarction. - Order coronary CT scan to evaluate coronary arteries for blockages or stenosis. - Discuss results with her once available and determine further management based on findings.  Abnormal EKG EKG shows poor R wave progression, possibly indicating an old anterior infarct. However, the findings are not definitive for myocardial infarction. Further evaluation with imaging  studies is necessary to confirm or rule out cardiac damage.  Hypertension Hypertension is managed with amlodipine  2.5 mg daily and metoprolol  50 mg daily. Blood pressure control is crucial given her cardiovascular risk factors.  Hyperlipidemia Hyperlipidemia is managed with lovastatin  20 mg daily. Lipid control is important to reduce cardiovascular risk.  Diabetes mellitus Diabetes  mellitus is a significant risk factor for cardiovascular disease. Current management includes medication, but specific details of diabetes management were not discussed.  Sleep apnea Sleep apnea is a known risk factor for cardiovascular disease. The management of sleep apnea was not discussed in detail during the conversation.         Will follow up with results of study  Signed, Dorothye Gathers, MD

## 2023-07-06 LAB — BASIC METABOLIC PANEL WITH GFR
BUN/Creatinine Ratio: 16 (ref 12–28)
BUN: 17 mg/dL (ref 8–27)
CO2: 25 mmol/L (ref 20–29)
Calcium: 9.1 mg/dL (ref 8.7–10.3)
Chloride: 104 mmol/L (ref 96–106)
Creatinine, Ser: 1.04 mg/dL — ABNORMAL HIGH (ref 0.57–1.00)
Glucose: 62 mg/dL — ABNORMAL LOW (ref 70–99)
Potassium: 4.6 mmol/L (ref 3.5–5.2)
Sodium: 143 mmol/L (ref 134–144)
eGFR: 57 mL/min/{1.73_m2} — ABNORMAL LOW (ref >59)

## 2023-07-07 ENCOUNTER — Other Ambulatory Visit (HOSPITAL_BASED_OUTPATIENT_CLINIC_OR_DEPARTMENT_OTHER): Payer: Self-pay

## 2023-07-07 ENCOUNTER — Encounter: Payer: Self-pay | Admitting: Cardiology

## 2023-07-08 ENCOUNTER — Encounter: Payer: Self-pay | Admitting: Internal Medicine

## 2023-07-08 ENCOUNTER — Ambulatory Visit (INDEPENDENT_AMBULATORY_CARE_PROVIDER_SITE_OTHER): Admitting: Internal Medicine

## 2023-07-08 VITALS — BP 111/73 | HR 74 | Temp 98.3°F | Ht 64.0 in | Wt 197.8 lb

## 2023-07-08 DIAGNOSIS — Z87891 Personal history of nicotine dependence: Secondary | ICD-10-CM

## 2023-07-08 DIAGNOSIS — G4733 Obstructive sleep apnea (adult) (pediatric): Secondary | ICD-10-CM

## 2023-07-08 MED ORDER — LORATADINE 10 MG PO TABS: ORAL_TABLET | ORAL | 11 refills | Status: AC

## 2023-07-08 MED ORDER — ALBUTEROL SULFATE HFA 108 (90 BASE) MCG/ACT IN AERS: 2.0000 | INHALATION_SPRAY | Freq: Four times a day (QID) | RESPIRATORY_TRACT | 12 refills | Status: AC | PRN

## 2023-07-08 MED ORDER — TRELEGY ELLIPTA 100-62.5-25 MCG/ACT IN AEPB: INHALATION_SPRAY | RESPIRATORY_TRACT | 12 refills | Status: AC
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
## 2023-07-08 NOTE — Telephone Encounter (Signed)
 Copied from CRM [id number]. Topic: General - Other >> Jul 02, 2023  8:52 AM Jocelyn Little O wrote: Reason for CRM: hydei calling from [employer] [facility] . Just need yo get a meesage to dr young . Received a prescription for the patient .sleep study its from 2023 and she canceled order and wants to have a sleep study done and the sleep study is 72 years old so she can't get a copy. So she just want to do a new sleep study and [facility] all new documentation   07/08/2023 Patient has an office visit with Dr. Linder Revere today at 4:00 PM.  Called patient.  Will address any issues at visit with Dr. Linder Revere

## 2023-07-08 NOTE — Patient Instructions (Signed)
 Order- Schedule home sleep test   dx OSA  Please call us  about 2 weeks after your sleep test for results and recommendations

## 2023-07-13 ENCOUNTER — Other Ambulatory Visit (HOSPITAL_BASED_OUTPATIENT_CLINIC_OR_DEPARTMENT_OTHER): Payer: Self-pay

## 2023-07-19 ENCOUNTER — Encounter: Payer: Self-pay | Admitting: Internal Medicine

## 2023-07-27 ENCOUNTER — Ambulatory Visit

## 2023-07-27 DIAGNOSIS — G4733 Obstructive sleep apnea (adult) (pediatric): Secondary | ICD-10-CM

## 2023-07-29 ENCOUNTER — Telehealth: Payer: Self-pay | Admitting: Internal Medicine

## 2023-07-29 NOTE — Telephone Encounter (Signed)
 Cmn received from [employer] for CPAP/BiPAP supplies.

## 2023-07-30 ENCOUNTER — Telehealth (HOSPITAL_COMMUNITY): Payer: Self-pay | Admitting: Emergency Medicine

## 2023-07-30 NOTE — Telephone Encounter (Signed)
 Attempted to call patient regarding upcoming cardiac CT appointment. Left message on voicemail with name and callback number Rockwell Alexandria RN Navigator Cardiac Imaging [facility] [phone] Office [phone] Cell

## 2023-08-03 ENCOUNTER — Ambulatory Visit (HOSPITAL_COMMUNITY)
Admission: RE | Admit: 2023-08-03 | Discharge: 2023-08-03 | Disposition: A | Discharge status: Home / Self Care | Source: Ambulatory Visit | Attending: Cardiology | Admitting: Cardiology

## 2023-08-03 ENCOUNTER — Ambulatory Visit: Payer: Self-pay | Admitting: Cardiology

## 2023-08-03 DIAGNOSIS — R072 Precordial pain: Secondary | ICD-10-CM | POA: Insufficient documentation

## 2023-08-03 MED ORDER — DILTIAZEM HCL 25 MG/5ML IV SOLN: 10.0000 mg | INTRAVENOUS | Status: DC | PRN

## 2023-08-03 MED ORDER — METOPROLOL TARTRATE 5 MG/5ML IV SOLN: 10.0000 mg | Freq: Once | INTRAVENOUS | Status: DC | PRN

## 2023-08-03 MED ORDER — NITROGLYCERIN 0.4 MG SL SUBL
0.8000 mg | SUBLINGUAL_TABLET | Freq: Once | SUBLINGUAL | Status: AC
  Administered 2023-08-03: 0.8 mg via SUBLINGUAL

## 2023-08-03 MED ORDER — IOHEXOL 350 MG/ML SOLN
100.0000 mL | Freq: Once | INTRAVENOUS | Status: AC | PRN
  Administered 2023-08-03: 100 mL via INTRAVENOUS

## 2023-08-04 ENCOUNTER — Ambulatory Visit (HOSPITAL_COMMUNITY)
Admission: RE | Admit: 2023-08-04 | Discharge: 2023-08-04 | Disposition: A | Discharge status: Home / Self Care | Source: Ambulatory Visit | Attending: Cardiovascular Disease | Admitting: Cardiovascular Disease

## 2023-08-04 DIAGNOSIS — R0600 Dyspnea, unspecified: Secondary | ICD-10-CM | POA: Diagnosis present

## 2023-08-04 LAB — ECHOCARDIOGRAM COMPLETE
Area-P 1/2: 3.48 cm2
S' Lateral: 2.6 cm

## 2023-08-05 ENCOUNTER — Telehealth: Payer: Self-pay | Admitting: Internal Medicine

## 2023-08-05 DIAGNOSIS — G4733 Obstructive sleep apnea (adult) (pediatric): Secondary | ICD-10-CM

## 2023-08-05 NOTE — Telephone Encounter (Signed)
 Hey Dr. Linder Revere, patient would like to do an In-lab Sleep Study due to the [facility] Device being uncomforatble and difficult to use. Can we have an order for an in-lab for the patient?

## 2023-08-06 NOTE — Telephone Encounter (Signed)
 Change order from HST to in-center Split Night sleep study for dx OSA, complicated by significant weight loss and patient report that she is unable to manage home sleep test monitor equipment.

## 2023-08-06 NOTE — Telephone Encounter (Signed)
 CMN faxed successfully and signed.

## 2023-08-10 NOTE — Addendum Note (Signed)
 Addended byRefugio Cantor M on: 08/10/2023 10:02 AM   Modules accepted: Orders

## 2023-08-10 NOTE — Telephone Encounter (Addendum)
 Placed order for in center Split Night sleep study per Dr. Linder Revere.  Will request [facility] to cancel current order for HST.  Left detailed message on patients VM.

## 2023-08-10 NOTE — Telephone Encounter (Signed)
 No problem. Home Sleep Test has been canceled.

## 2023-08-11 ENCOUNTER — Other Ambulatory Visit (HOSPITAL_BASED_OUTPATIENT_CLINIC_OR_DEPARTMENT_OTHER): Payer: Self-pay

## 2023-08-18 ENCOUNTER — Telehealth: Payer: Self-pay | Admitting: Internal Medicine

## 2023-08-18 NOTE — Telephone Encounter (Signed)
 CMN for Cpap [facility]

## 2023-08-25 NOTE — Telephone Encounter (Signed)
 The CMN in [employer] has been signed. Not sure if there is a paper CMN or not

## 2023-08-25 NOTE — Telephone Encounter (Signed)
 Rc'd signed copy of CPAP supply order from [facility]. Will fax back to [phone]. Once confirmation rec'd I will send to scan.

## 2023-09-03 ENCOUNTER — Other Ambulatory Visit (HOSPITAL_BASED_OUTPATIENT_CLINIC_OR_DEPARTMENT_OTHER): Payer: Self-pay

## 2023-09-06 ENCOUNTER — Other Ambulatory Visit (HOSPITAL_BASED_OUTPATIENT_CLINIC_OR_DEPARTMENT_OTHER): Payer: Self-pay

## 2023-09-07 ENCOUNTER — Other Ambulatory Visit: Payer: Self-pay

## 2023-09-07 ENCOUNTER — Other Ambulatory Visit (HOSPITAL_BASED_OUTPATIENT_CLINIC_OR_DEPARTMENT_OTHER): Payer: Self-pay

## 2023-09-07 MED ORDER — MOUNJARO 10 MG/0.5ML ~~LOC~~ SOAJ
10.0000 mg | SUBCUTANEOUS | 3 refills | Status: AC
  Filled 2023-09-07 (×2): qty 6, 84d supply, fill #0
  Filled 2023-10-25: qty 6, 84d supply, fill #1
  Filled 2023-10-25: qty 2, 28d supply, fill #1
  Filled 2023-11-23 – 2023-12-28 (×2): qty 2, 28d supply, fill #2
  Filled 2023-12-29: qty 6, 84d supply, fill #2
  Filled 2024-03-24: qty 6, 84d supply, fill #3

## 2023-09-13 ENCOUNTER — Ambulatory Visit (HOSPITAL_BASED_OUTPATIENT_CLINIC_OR_DEPARTMENT_OTHER): Attending: Internal Medicine | Admitting: Internal Medicine

## 2023-09-13 DIAGNOSIS — G4733 Obstructive sleep apnea (adult) (pediatric): Secondary | ICD-10-CM | POA: Diagnosis not present

## 2023-09-15 NOTE — Procedures (Signed)
 [facility] [address] [place], [place] [address] Tel: [phone]   Fax: [phone]  Split Night Interpretation  Patient Name:  Jocelyn Little, Jocelyn Little Date:  09/13/2023 Referring Physician:  NEYSA REGGY BIRCH., MD  Indications for Polysomnography The patient is a 73 year old Female who is 5' 4 and weighs 194.0 lbs.  Her BMI equals 33.5.  A diagnostic polysomnogram was performed to evaluate for -.OSA  After 120.5 minutes of sleep time the patient exhibited sufficient respiratory events qualifying her for a CPAP trial which was then initiated.    Medications taken at 2050.  METOPROLOL  TARTRATE  LEXAPRO   OXYBUTYNIN   Polysomnogram Data A full night polysomnogram was performed recording the standard physiologic parameters including EEG, EOG, EMG, EKG, nasal and oral airflow.  Respiratory parameters of chest and abdominal movements are recorded with Piezo-Crystal motion transducers.  Oxygen saturation was recorded by pulse oximetry.    Sleep Architecture The total recording time of the diagnostic portion of the study was 194.4 minutes.  The total sleep time was 120.5 minutes.  During the diagnostic portion of the study, the patient spent 2.9% of total sleep time in Stage N1, 97.1% in Stage N2, 0.0% in Stages N3, and 0.0% in REM.   Sleep latency was 53.9 minutes.  REM latency was - minutes.  Sleep Efficiency was 62.0%.  Wake after Sleep Onset time was 20.0 minutes.   At 12:18:25 AM the patient was placed on PAP treatment and was titrated at pressures ranging from 5/2* cm/H20 with supplemental oxygen at - up to 18/2* cm/H20 with supplemental oxygen at -.  The total recording time of the treatment portion of the study was 287.7 minutes.  The total sleep time was 243.0 minutes.  During the treatment portion of the study, the patient spent 2.5% of total sleep time in Stage N1, 94.4% in Stage N2, 0.6% in Stages N3, and 2.5% in REM.   Sleep latency was  17.0 minutes.  REM latency was 116.0 minutes.  Sleep Efficiency was 84.5%.  Wake after Sleep Onset time was 28.0 minutes.  Respiratory Events During the diagnostic portion of the study, the polysomnogram revealed a presence of 1 obstructive, - central, and - mixed apneas resulting in an Apnea index of 0.5 events per hour.  There were 48 hypopneas (>=3% desaturation and/or arousal) resulting in an Apnea\Hypopnea Index (AHI >=3% desaturation and/or arousal) of 24.4 events per hour.  There were 22 hypopneas (>=4% desaturation) resulting in an Apnea\Hypopnea Index (AHI >=4% desaturation) of 11.5 events per hour.  There were 103 Respiratory Effort Related Arousals resulting in a RERA index of 51.3 events per hour. The Respiratory Disturbance Index is 75.7 events per hour.  The snore index was - events per hour.  Mean oxygen saturation was 90.7%.  The lowest oxygen saturation during sleep was 79.0%.  Time spent <=88% oxygen saturation was 41.9 minutes (23.1%).  During the treatment portion of the study, the polysomnogram revealed a presence of 12 obstructive, 3 centrals, and - mixed apneas resulting in an Apnea index of 3.7 events per hour.  There were 17 hypopneas (>=3% desaturation and/or arousal) resulting in an Apnea\Hypopnea Index (AHI >=3% desaturation and/or arousal) of 7.9 events per hour.  There were 6 hypopneas (>=4% desaturation) resulting in an Apnea\Hypopnea Index (AHI >=4% desaturation) of 5.2 events per hour.  There were 81 Respiratory Effort Related Arousals resulting in a RERA index of 20.0 events per hour. The Respiratory Disturbance Index is  27.9 events per hour.  The snore index was - events per hour.  Mean oxygen saturation was 93.9%.  The lowest oxygen saturation during sleep was 87.0%.  Time spent <=88% oxygen saturation was 0.3 minutes (0.1%).  Limb Activity During the diagnostic portion of the study, there were - limb movements recorded.  Of this total, - were classified as PLMs.  Of the  PLMs, - were associated with arousals.  The Limb Movement index was - per hour while the PLM index was - per hour.  During the treatment portion of the study, there were - limb movements recorded.  Of this total, - were classified as PLMs.  Of the PLMs, - were associated with arousals.  The Limb Movement index was - per hour while the PLM index was - per hour.  Cardiac Summary During the diagnostic portion of the study, the average pulse rate was 68.5 bpm.  The minimum pulse rate was 58.0 bpm while the maximum pulse rate was 93.0 bpm.  During the treatment portion of the study, the average pulse rate was 62.8 bpm.  The minimum pulse rate was 50.0 bpm while the maximum pulse rate was 87.0 bpm.   Comment: Mild obstructive sleep apnea, AHI (4%) 11.5/hr. Snoring with oxygen desaturation to a nadir of 79%, mean 90.7%. CPAP titration to 18 cwp, EPR 2. Residu8aal AHI (4%) 1.3/hr, minimum O2 saturation 90%, mean 93.3%.  Diagnosis: Obstructive sleep apnea  Recommendations: Autopap 5-20 or fixed CPAP 18, EPR 2. Patient wore a medium ResMed AirFit F30 full face mask with heated humidification.   This study was personally reviewed and electronically signed by: NEYSA REGGY BIRCH., MD Accredited Board Certified in Sleep Medicine Date/Time: 09/15/23   3:12   Split Night Report  Patient Name: Jocelyn Little, Jocelyn Little Study Date: 09/13/2023  Date of Birth: Oct 15, 1950 Study Type: Split Night  Age: 73 year MRN #: [id number]  Sex: Female Interpreting Physician: NEYSA REGGY, [id number]  Height: 5' 4 Referring Physician: NEYSA REGGY D., MD  Weight: 194.0 lbs Recording Tech: Charlie George RPSGT  BMI: 33.5 Scoring Tech: Charlie George RPSGT  ESS: 12 Neck Size: 13.5  Mask Type ResMed AirFIt F30 FFM Final Pressure: 18 cm H2O  Mask Size: Medium Supplemental O2: N/A   Study Overview  DIAGNOSTIC TREATMENT  Lights Off: 09:03:56 PM Lights Off: 12:18:18 AM  Lights On: 12:18:18 AM Lights On: 05:05:58 AM  Time in Bed:  194.4 min. Time in Bed: 287.7 min.  Total Sleep Time: 120.5 min. Total Sleep Time: 243.0 min.  Sleep Efficiency: 62.0% Sleep Efficiency: 84.5%  Sleep Latency: 53.9 min. Sleep Latency: 17.0 min.  REM Latency from Sleep Onset: - min. REM Latency from Sleep Onset: 116.0 min.  Wake After Sleep Onset: 20.0 min. Wake After Sleep Onset: 28.0 min.   DIAGNOSTIC TREATMENT   Count Index  Count Index  Awakenings: 7 3.5 Awakenings: 12 3.0  Arousals: 147 73.2 Arousals: 106 26.2  AHI (>=3% Desat and/or Ar.): 49 24.4 AHI (>=3% Desat and/or Ar.): 32 7.9  AHI (>=4% Desat): 23 11.5 AHI (>=4% Desat): 21 5.2   Limb Movements: - - Limb Movements: - -  Snore: - - Snore: - -  Desaturations: 44 22.4 Desaturations: 20 4.9  Minimum SpO2 TST: 79.0% Minimum SpO2 TST: 87.0%    Sleep Architecture   DIAGNOSTIC TREATMENT ENTIRE NIGHT  Stages Time (mins) % Sleep Time Time (mins) % Sleep Time Time (mins) % Sleep Time  Wake 74.0  45.0  119.0   Stage N1 3.5  2.9% 6.0 2.5% 9.5 2.6%  Stage N2 117.0 97.1% 229.5 94.4% 346.5 95.3%  Stage N3 0.0 0.0% 1.5 0.6% 1.5 0.4%  REM 0.0 0.0% 6.0 2.5% 6.0 1.7%   Arousal Summary   DIAGNOSTIC TREATMENT   NREM REM TST Index NREM REM TST Index  Respiratory Ar. 143 - 143 71.2 101 - 101 24.9  PLM Ar. - - - - - - - -  Isolated Limb Movement Ar. - - - - - - - -  Snore Ar. - - - - - - - -  Spontaneous Ar. 4 - 4 2.0 5 - 5 1.2  Total Ar. 147 - 147 73.2 106 - 106 26.2    Respiratory Summary  DIAGNOSTIC By Sleep Stage By Body Position Total   NREM REM Supine Non-Supine   Time (min) 120.5 0.0 - 120.5 120.5         Obstructive Apnea 1 - - 1 1  Mixed Apnea - - - - -  Central Apnea - - - - -  Total Apneas 1 - - 1 1  Total Apnea Index 0.5 - - 0.5 0.5         Hypopneas (>=3% Desat and/or Ar.) 48 - - 48 48  AHI (>=3% Desat and/or Ar.) 24.4 - - 24.4 24.4         Hypopneas (>=4% Desat) 22 - - 22 22  AHI (>=4% Desat) 11.5 - - 11.5 11.5          RERAs 103 - - 103 103  RERA Index 51.3 -  - 51.3 51.3         RDI 75.7 - - 75.7 75.7    TREATMENT By Sleep Stage By Body Position Total   NREM REM Supine Non-Supine   Time (min) 237.0 6.0 58.5 184.5 243.0         Obstructive Apnea 12 - - 12 12  Mixed Apnea - - - - -  Central Apnea 1 2 - 3 3  Total Apneas 13 2 - 15 15  Total Apnea Index 3.3 20.0 - 4.9 3.7         Hypopneas (>=3% Desat and/or Ar.) 17 - 1 16 17   AHI (>=3% Desat and/or Ar.) 7.6 20.0 1.0 10.1 7.9         Hypopneas (>=4% Desat) 6 - 1 5 6   AHI (>=4% Desat) 4.8 20.0 1.0 6.5 5.2          RERAs 77 4 9 72 81  RERA Index 19.5 40.0 9.2 23.4 20.0         RDI 27.1 60.0 10.3 33.5 27.9    Respiratory Event Durations   DIAGNOSTIC TREATMENT  Apnea NREM REM NREM REM  Average (seconds) 22.8 - 19.4 10.3  Maximum (seconds) 22.8 - 25.4 10.4  Hypopnea      Average (seconds) 25.2 - 31.3 -  Maximum (seconds) 52.2 - 83.9 -    Limb Movement Summary   DIAGNOSTIC TREATMENT   Count Index Count Index  Isolated Limb Movements - - - -  Periodic Limb Movements (PLMs) - - - -  Total Limb Movements - - - -    Oxygen Saturation Summary   DIAGNOSTIC TREATMENT   Wake NREM REM TST Wake NREM REM TST  Average SpO2 93.5% 89.2% - 89.2% 94.1% 93.8% 98.2% 93.9%  Minimum SpO2 83.0% 79.0% - 79.0%  85.0% 87.0% 93.0% 87.0%   Maximum SpO2 98.0% 97.0% - 97.0%  99.0% 99.0% 100.0% 100.0%  DIAGNOSTIC Oxygen Saturation Distribution  Range (%) Time in range (min) Time in range (%)   90.0 - 100.0 115.0 63.4%  80.0 - 90.0 55.5 30.6%  70.0 - 80.0 0.9 0.5%  60.0 - 70.0 - -  50.0 - 60.0 - -  0.0 - 50.0 - -  Time Spent <=88% SpO2  Range (%) Time in range (min) Time in range (%)  0.0 - 88.0 41.9 23.1%      Count Index  Desaturations: 44 22.4   TREATMENT Oxygen Saturation Distribution  Range (%) Time in range (min) Time in range (%)   90.0 - 100.0 279.0 97.5%  80.0 - 90.0 2.8 1.0%  70.0 - 80.0 - -  60.0 - 70.0 - -  50.0 - 60.0 - -  0.0 - 50.0 - -  Time Spent <=88%  SpO2  Range (%) Time in range (min) Time in range (%)  0.0 - 88.0 0.3 0.1%      Count Index  Desaturations: 20 4.9     Cardiac Summary   DIAGNOSTIC TREATMENT   Wake NREM REM Total Wake NREM REM Total  Average Pulse Rate (BPM) 68.0 68.8 - 68.5 67.8 61.8 71.2 62.8  Minimum Pulse Rate (BPM) 58.0 61.0 - 58.0 50.0 50.0 63.0 50.0  Maximum Pulse Rate (BPM) 93.0 83.0 - 93.0 87.0 78.0 87.0 87.0   Pulse Rate Distribution   DIAGNOSTIC  Range (bpm) Time in range (min) Time in range (%)  0.0 - 40.0 - -  40.0 - 60.0 0.3 0.1%  60.0 - 80.0 170.3 94.4%  80.0 - 100.0 0.8 0.5%  100.0 - 120.0 - -  120.0 - 140.0 - -  140.0 - 200.0 - -   TREATMENT  Range (bpm) Time in range (min) Time in range (%)  0.0 - 40.0 - -  40.0 - 60.0 117.6 41.0%  60.0 - 80.0 163.1 56.9%  80.0 - 100.0 1.3 0.4%  100.0 - 120.0 - -  120.0 - 140.0 - -  140.0 - 200.0 - -    Titration Summary  PAP Device PAP Level O2 Level Time (min) Wake (min) NREM (min) REM (min) Sleep Eff% OA# CA# MA# Hyp# (>=3%) AHI (>=3%) Hyp# (>=4%) AHI (>=%4) RERA RDI OSat <=88% (min) Min [employer] Ar. Index  - Off - 194.5 74.0 120.5 0.0 62.0% 1 - - 48 24.4 22  11.5 103  75.7  41.1 79.0 89.2 73.2  CPAP EPR 5/2 - 29.0 17.0 12.0 0.0 41.4% 3 - - - 15.0 -  15.0 7  50.0  0.0 91.0 91.9 50.0  CPAP EPR 7/2 - 21.0 0.5 20.5 0.0 97.6% 1 - - 3 11.7 -  2.9 12  46.8  0.0 89.0 92.8 41.0  CPAP EPR 9/2 - 44.0 5.5 38.5 0.0 87.5% 5 - - 2 10.9 2  10.9 8  23.4  0.2 87.0 93.5 24.9  CPAP EPR 11/2 - 22.5 0.0 22.5 0.0 100.0% 2 1 - 7 26.7 2  13.3 4  37.3  0.0 90.0 94.9 34.7  CPAP EPR 13/2 - 14.5 0.5 14.0 0.0 96.6% 1 - - 1 8.6 -  4.3 8  42.9  0.0 93.0 95.7 47.1  CPAP EPR 14/2 - 26.5 6.5 14.0 6.0 75.5% - 2 - 1 9.0 -  6.0 9  36.0  0.0 93.0 97.7 21.0  CPAP EPR 15/2 - 41.0 3.0 38.0 0.0 92.7% - - - - - -  - 18  28.4  0.0 91.0 93.3 28.4  CPAP EPR 16/2 - 21.0 3.5 17.5 0.0 83.3% - - - 1 3.4 1  3.4 3  13.7  0.0 93.0 93.8 10.3  CPAP EPR 17/2 - 19.5 5.0 14.5 0.0 74.4% - -  - - - -  - 3  12.4  0.0 92.0 93.5 12.4  CPAP EPR 18/2 - 49.0 3.5 45.5 0.0 92.9% - - - 2 2.6 1  1.3 9  14.5  0.0 90.0 93.3 14.5    Hypnograms                           Technologist Comments  Patient arrived for a split night study. The patient was placed in room 3. The procedure was explained, and the patient had no questions. The patient has a history of CPAP use. Prior to the hookup, the patient was fitted for a mask and trialed CPAP briefly. Due to low saturations, an AHI at or above 15, and 2 hours of sleep, the patient met split night criteria. CPAP was started at a pressure of 5cm H2O with an EPR of 2 and was adjusted to a pressure of 18cm H2O with an EPR of 2. A medium ResMed AirFit F30 full-face mask was used during the titration. The patient tolerated the pressure well. The patient slept in the left, right, and supine positions. Oral venting was observed. No PLMs, bruxism, seizure, or spike wave activity was observed. All stages of sleep were recorded. Snoring was severe and loud during the diagnostic portion, but was resolved with PAP therapy. Abnormal ECG reading were recorded and marked during the diagnostic portion of the study, but were resolved during PAP therapy.                          Reggy Salt Diplomate, Biomedical engineer of Sleep Medicine  ELECTRONICALLY SIGNED ON:  09/15/2023, 3:06 PM [facility] PH: [phone]   FX: [phone] ACCREDITED BY THE [employer]ICAN [employer] OF SLEEP MEDICINE

## 2023-09-24 ENCOUNTER — Encounter: Payer: Self-pay | Admitting: Advanced Practice Midwife

## 2023-10-11 ENCOUNTER — Ambulatory Visit: Admitting: Cardiology

## 2023-10-18 ENCOUNTER — Encounter (HOSPITAL_BASED_OUTPATIENT_CLINIC_OR_DEPARTMENT_OTHER): Admitting: Internal Medicine

## 2023-10-25 ENCOUNTER — Other Ambulatory Visit (HOSPITAL_BASED_OUTPATIENT_CLINIC_OR_DEPARTMENT_OTHER): Payer: Self-pay

## 2023-11-27 ENCOUNTER — Encounter: Payer: Self-pay | Admitting: Internal Medicine

## 2023-11-29 ENCOUNTER — Other Ambulatory Visit: Payer: Self-pay | Admitting: *Deleted

## 2023-11-29 DIAGNOSIS — G4733 Obstructive sleep apnea (adult) (pediatric): Secondary | ICD-10-CM

## 2023-11-29 NOTE — Telephone Encounter (Signed)
 Dr. Neysa, Patient has a split night sleep study done on 09/13/23, she is messaging the office regarding the results.  Please advise.  Thank you.

## 2023-11-29 NOTE — Telephone Encounter (Signed)
 Her recent sleep study showed 11-12 apneas/ hour and suggested she needs a higher CPAP pressure for better control.  Please order- DME [facility]- change CPAP to autopap 10-20,refit mask of choice, humidifier, supplies, AirView/ card  She should be seen for CPAP follow-up in 2-3 months.

## 2023-12-06 ENCOUNTER — Other Ambulatory Visit (HOSPITAL_BASED_OUTPATIENT_CLINIC_OR_DEPARTMENT_OTHER): Payer: Self-pay

## 2023-12-06 ENCOUNTER — Other Ambulatory Visit: Payer: Self-pay

## 2023-12-15 ENCOUNTER — Encounter (HOSPITAL_COMMUNITY): Payer: Self-pay | Admitting: Emergency Medicine

## 2023-12-15 ENCOUNTER — Emergency Department (HOSPITAL_COMMUNITY)

## 2023-12-15 ENCOUNTER — Other Ambulatory Visit: Payer: Self-pay

## 2023-12-15 ENCOUNTER — Emergency Department (HOSPITAL_COMMUNITY)
Admission: EM | Admit: 2023-12-15 | Discharge: 2023-12-15 | Disposition: A | Discharge status: Home / Self Care | Attending: Emergency Medicine | Admitting: Emergency Medicine

## 2023-12-15 DIAGNOSIS — E876 Hypokalemia: Secondary | ICD-10-CM | POA: Insufficient documentation

## 2023-12-15 DIAGNOSIS — R509 Fever, unspecified: Secondary | ICD-10-CM | POA: Diagnosis present

## 2023-12-15 DIAGNOSIS — Z7984 Long term (current) use of oral hypoglycemic drugs: Secondary | ICD-10-CM | POA: Insufficient documentation

## 2023-12-15 DIAGNOSIS — E860 Dehydration: Secondary | ICD-10-CM | POA: Insufficient documentation

## 2023-12-15 DIAGNOSIS — E119 Type 2 diabetes mellitus without complications: Secondary | ICD-10-CM | POA: Insufficient documentation

## 2023-12-15 DIAGNOSIS — Z79899 Other long term (current) drug therapy: Secondary | ICD-10-CM | POA: Diagnosis not present

## 2023-12-15 DIAGNOSIS — R1033 Periumbilical pain: Secondary | ICD-10-CM | POA: Insufficient documentation

## 2023-12-15 DIAGNOSIS — I10 Essential (primary) hypertension: Secondary | ICD-10-CM | POA: Diagnosis not present

## 2023-12-15 DIAGNOSIS — Z7982 Long term (current) use of aspirin: Secondary | ICD-10-CM | POA: Insufficient documentation

## 2023-12-15 DIAGNOSIS — R197 Diarrhea, unspecified: Secondary | ICD-10-CM | POA: Insufficient documentation

## 2023-12-15 LAB — CBC WITH DIFFERENTIAL/PLATELET
Abs Immature Granulocytes: 0.02 K/uL (ref 0.00–0.07)
Basophils Absolute: 0 K/uL (ref 0.0–0.1)
Basophils Relative: 1 %
Eosinophils Absolute: 0 K/uL (ref 0.0–0.5)
Eosinophils Relative: 0 %
HCT: 37.8 % (ref 36.0–46.0)
Hemoglobin: 12.4 g/dL (ref 12.0–15.0)
Immature Granulocytes: 0 %
Lymphocytes Relative: 17 %
Lymphs Abs: 1 K/uL (ref 0.7–4.0)
MCH: 30.6 pg (ref 26.0–34.0)
MCHC: 32.8 g/dL (ref 30.0–36.0)
MCV: 93.3 fL (ref 80.0–100.0)
Monocytes Absolute: 0.8 K/uL (ref 0.1–1.0)
Monocytes Relative: 13 %
Neutro Abs: 4.2 K/uL (ref 1.7–7.7)
Neutrophils Relative %: 69 %
Platelets: 185 K/uL (ref 150–400)
RBC: 4.05 MIL/uL (ref 3.87–5.11)
RDW: 13.1 % (ref 11.5–15.5)
WBC: 6.1 K/uL (ref 4.0–10.5)
nRBC: 0 % (ref 0.0–0.2)

## 2023-12-15 LAB — C DIFFICILE QUICK SCREEN W PCR REFLEX
C Diff antigen: NEGATIVE
C Diff interpretation: NOT DETECTED
C Diff toxin: NEGATIVE

## 2023-12-15 LAB — URINALYSIS, ROUTINE W REFLEX MICROSCOPIC
Bilirubin Urine: NEGATIVE
Glucose, UA: NEGATIVE mg/dL
Hgb urine dipstick: NEGATIVE
Ketones, ur: NEGATIVE mg/dL
Leukocytes,Ua: NEGATIVE
Nitrite: NEGATIVE
Protein, ur: NEGATIVE mg/dL
Specific Gravity, Urine: 1.038 — ABNORMAL HIGH (ref 1.005–1.030)
pH: 6 (ref 5.0–8.0)

## 2023-12-15 LAB — COMPREHENSIVE METABOLIC PANEL WITH GFR
ALT: 15 U/L (ref 0–44)
AST: 18 U/L (ref 15–41)
Albumin: 2.9 g/dL — ABNORMAL LOW (ref 3.5–5.0)
Alkaline Phosphatase: 45 U/L (ref 38–126)
Anion gap: 10 (ref 5–15)
BUN: 12 mg/dL (ref 8–23)
CO2: 25 mmol/L (ref 22–32)
Calcium: 8.2 mg/dL — ABNORMAL LOW (ref 8.9–10.3)
Chloride: 102 mmol/L (ref 98–111)
Creatinine, Ser: 1.16 mg/dL — ABNORMAL HIGH (ref 0.44–1.00)
GFR, Estimated: 50 mL/min — ABNORMAL LOW (ref >60)
Glucose, Bld: 112 mg/dL — ABNORMAL HIGH (ref 70–99)
Potassium: 3.2 mmol/L — ABNORMAL LOW (ref 3.5–5.1)
Sodium: 137 mmol/L (ref 135–145)
Total Bilirubin: 0.5 mg/dL (ref 0.0–1.2)
Total Protein: 6 g/dL — ABNORMAL LOW (ref 6.5–8.1)

## 2023-12-15 LAB — I-STAT CG4 LACTIC ACID, ED: Lactic Acid, Venous: 0.9 mmol/L (ref 0.5–1.9)

## 2023-12-15 LAB — I-STAT CHEM 8, ED
BUN: 13 mg/dL (ref 8–23)
Calcium, Ion: 1.08 mmol/L — ABNORMAL LOW (ref 1.15–1.40)
Chloride: 101 mmol/L (ref 98–111)
Creatinine, Ser: 1.2 mg/dL — ABNORMAL HIGH (ref 0.44–1.00)
Glucose, Bld: 117 mg/dL — ABNORMAL HIGH (ref 70–99)
HCT: 37 % (ref 36.0–46.0)
Hemoglobin: 12.6 g/dL (ref 12.0–15.0)
Potassium: 3.2 mmol/L — ABNORMAL LOW (ref 3.5–5.1)
Sodium: 138 mmol/L (ref 135–145)
TCO2: 25 mmol/L (ref 22–32)

## 2023-12-15 LAB — LIPASE, BLOOD: Lipase: 30 U/L (ref 11–51)

## 2023-12-15 MED ORDER — ACETAMINOPHEN 500 MG PO TABS
1000.0000 mg | ORAL_TABLET | Freq: Once | ORAL | Status: AC
  Administered 2023-12-15: 1000 mg via ORAL
  Filled 2023-12-15: qty 2

## 2023-12-15 MED ORDER — IOHEXOL 350 MG/ML SOLN
75.0000 mL | Freq: Once | INTRAVENOUS | Status: AC | PRN
Start: 2023-12-15 — End: 2023-12-15
  Administered 2023-12-15: 75 mL via INTRAVENOUS

## 2023-12-15 MED ORDER — POTASSIUM CHLORIDE CRYS ER 20 MEQ PO TBCR
40.0000 meq | EXTENDED_RELEASE_TABLET | Freq: Once | ORAL | Status: AC
  Administered 2023-12-15: 40 meq via ORAL
  Filled 2023-12-15: qty 2

## 2023-12-15 MED ORDER — LACTATED RINGERS IV BOLUS
1000.0000 mL | Freq: Once | INTRAVENOUS | Status: AC
  Administered 2023-12-15: 1000 mL via INTRAVENOUS

## 2023-12-15 NOTE — ED Provider Notes (Signed)
 [name] EMERGENCY DEPARTMENT AT [facility] Provider Note   CSN: [id number] Arrival date & time: 12/15/23  1241     Patient presents with: No chief complaint on file.   Jocelyn Little is a 73 y.o. female.   Patient is a 73 year old female with a history of diabetes, hypertension, GERD who is presenting today with recurrent diarrhea and fever.  Patient reports the symptoms started on Monday and she has had numerous episodes of diarrhea since that time with fever and chills.  She reports there is no blood in her stool but it is a slimy brown color.  She is having midline abdominal tenderness as well.  She has had no nausea or vomiting but feels general myalgias.  Patient reports that she recently got back after a month stay in [place] and has been [place] the states since 17 [place].  She did have an episode of diarrhea that required a 2-day hospitalization while she was in [place] and reports she had been given antibiotics but does not recall ever doing any stool samples.  Her diarrhea had completely resolved until 2 days ago.  She stopped antibiotics when she [place].  She denies any respiratory complaints such as shortness of breath or chest pain.  The history is provided by the patient.       Prior to Admission medications   Medication Sig Start Date End Date Taking? Authorizing Provider  acetaminophen  (TYLENOL ) 650 MG CR tablet Take 1,300 mg by mouth every 8 (eight) hours as needed for pain.    [provider]  albuterol  (PROVENTIL  HFA;VENTOLIN  HFA) 108 (90 Base) MCG/ACT inhaler Inhale 2 puffs into the lungs every 6 (six) hours as needed for wheezing. 08/11/17   Gretta Ozell CROME, PA-C  albuterol  (VENTOLIN  HFA) 108 (90 Base) MCG/ACT inhaler Inhale 2 puffs into the lungs every 6 (six) hours as needed for wheezing or shortness of breath. 07/08/23   Neysa Rama D, MD  amLODipine  (NORVASC ) 5 MG tablet TAKE 1 TABLET BY MOUTH DAILY Patient taking  differently: Take 2.5 mg by mouth daily. 09/08/18   Purcell Emil Schanz, MD  aspirin EC 81 MG tablet Take 81 mg by mouth daily.    [provider]  blood glucose meter kit and supplies KIT Dispense based on patient and insurance preference. Use to check cbgs qd - fasting in the morning or 2 hours after a meal, E11.9 10/01/16   Gretta Ozell CROME, PA-C  buPROPion  (WELLBUTRIN  XL) 150 MG 24 hr tablet Take 1 tablet (150 mg total) by mouth daily. 08/11/17   Gretta Ozell CROME, PA-C  Cyanocobalamin  1000 MCG/ML LIQD Take 1 Dose by mouth 2 (two) times a week.    [provider]  cyclobenzaprine  (FLEXERIL ) 10 MG tablet Take 1 tablet (10 mg total) by mouth 2 (two) times daily as needed for muscle spasms. 01/25/21   Randol Simmonds, MD  esomeprazole  (NEXIUM ) 40 MG capsule Take 1 capsule (40 mg total) by mouth as needed (for indigestion). 08/11/17   Gretta Ozell CROME, PA-C  fluticasone  (FLONASE ) 50 MCG/ACT nasal spray Place 1 spray into both nostrils daily as needed. 10/13/19   [provider]  Fluticasone -Umeclidin-Vilant (TRELEGY ELLIPTA ) 100-62.5-25 MCG/ACT AEPB Inhale 1 puff then rinse mouth, once daily 07/08/23   Neysa Rama D, MD  HYDROcodone -acetaminophen  (NORCO/VICODIN) 5-325 MG tablet Take 1 tablet by mouth every 4 (four) hours as needed. 10/04/21   Haviland, Julie, MD  Ibuprofen -diphenhydrAMINE Cit (ADVIL  PM) 200-38 MG  TABS Take 1 tablet by mouth at bedtime as needed (sleep).    [provider]  loratadine  (CLARITIN ) 10 MG tablet 1 daily as needed for allergy 07/08/23   Neysa Rama D, MD  lovastatin  (MEVACOR ) 20 MG tablet Take 1 tablet (20 mg total) by mouth at bedtime. 09/08/18 12/07/18  Purcell Emil Schanz, MD  meloxicam  (MOBIC ) 7.5 MG tablet Take 1-2 tablets (7.5-15 mg total) by mouth daily. Patient taking differently: Take 7.5-15 mg by mouth daily as needed for pain. 08/11/17   Gretta Ozell CROME, PA-C  metFORMIN  (GLUCOPHAGE ) 500 MG tablet Take 1 tablet (500 mg total) by mouth daily  with breakfast. 08/11/17   Gretta Ozell CROME, PA-C  methocarbamol  (ROBAXIN ) 500 MG tablet Take 1 tablet (500 mg total) by mouth 2 (two) times daily. 10/11/22   Doretha Folks, MD  metoprolol  tartrate (LOPRESSOR ) 50 MG tablet TAKE 1 TABLET BY MOUTH DAILY Patient taking differently: Take 50 mg by mouth at bedtime. 09/08/18   Purcell Emil Schanz, MD  mirabegron ER (MYRBETRIQ) 50 MG TB24 tablet Take 50 mg by mouth every evening.    [provider]  oxybutynin (DITROPAN-XL) 10 MG 24 hr tablet Take 10 mg by mouth at bedtime.    [provider]  oxyCODONE -acetaminophen  (PERCOCET) 5-325 MG tablet Take 1 tablet by mouth every 8 (eight) hours as needed for severe pain. 03/31/20   Mesner, Selinda, MD  tirzepatide  (MOUNJARO ) 10 MG/0.5ML Pen Inject 10 mg into the skin once a week. 09/07/23     tirzepatide  (MOUNJARO ) 7.5 MG/0.5ML Pen Inject 7.5 mg into the skin once a week. 07/02/22     tirzepatide  (MOUNJARO ) 7.5 MG/0.5ML Pen Inject 7.5 mg into the skin once a week. 07/24/22     vortioxetine  HBr (TRINTELLIX ) 20 MG TABS tablet Take 1 tablet (20 mg total) by mouth daily. 08/11/17   Gretta Ozell CROME, PA-C  XIIDRA 5 % SOLN Place 1 drop into both eyes 2 (two) times daily. 03/27/20   [provider]    Allergies: Citrus, Dust mite extract, Naproxen, and Other    Review of Systems  Updated Vital Signs BP 134/60   Pulse 96   Temp (!) 101.9 F (38.8 C)   Resp 18   SpO2 91%   Physical Exam Vitals and nursing note reviewed.  Constitutional:      General: She is not in acute distress.    Appearance: She is well-developed.  HENT:     Head: Normocephalic and atraumatic.     Mouth/Throat:     Mouth: Mucous membranes are dry.  Eyes:     Pupils: Pupils are equal, round, and reactive to light.  Cardiovascular:     Rate and Rhythm: Normal rate and regular rhythm.     Heart sounds: Normal heart sounds. No murmur heard.    No friction rub.  Pulmonary:     Effort: Pulmonary effort is normal.      Breath sounds: Normal breath sounds. No wheezing or rales.  Abdominal:     General: Bowel sounds are normal. There is no distension.     Palpations: Abdomen is soft.     Tenderness: There is abdominal tenderness in the periumbilical area. There is no guarding or rebound.  Musculoskeletal:        General: No tenderness. Normal range of motion.     Right lower leg: No edema.     Left lower leg: No edema.     Comments: No edema  Skin:  General: Skin is warm and dry.     Findings: No rash.  Neurological:     Mental Status: She is alert and oriented to person, place, and time.     Cranial Nerves: No cranial nerve deficit.  Psychiatric:        Behavior: Behavior normal.     (all labs ordered are listed, but only abnormal results are displayed) Labs Reviewed  COMPREHENSIVE METABOLIC PANEL WITH GFR - Abnormal; Notable for the following components:      Result Value   Potassium 3.2 (*)    Glucose, Bld 112 (*)    Creatinine, Ser 1.16 (*)    Calcium 8.2 (*)    Total Protein 6.0 (*)    Albumin 2.9 (*)    GFR, Estimated 50 (*)    All other components within normal limits  I-STAT CHEM 8, ED - Abnormal; Notable for the following components:   Potassium 3.2 (*)    Creatinine, Ser 1.20 (*)    Glucose, Bld 117 (*)    Calcium, Ion 1.08 (*)    All other components within normal limits  GASTROINTESTINAL PANEL BY PCR, STOOL (REPLACES STOOL CULTURE)  C DIFFICILE QUICK SCREEN W PCR REFLEX    CBC WITH DIFFERENTIAL/PLATELET  LIPASE, BLOOD  URINALYSIS, ROUTINE W REFLEX MICROSCOPIC  I-STAT CG4 LACTIC ACID, ED    EKG: None  Radiology: CT ABDOMEN PELVIS W CONTRAST Result Date: 12/15/2023 CLINICAL DATA:  Acute nonlocalized abdominal pain EXAM: CT ABDOMEN AND PELVIS WITH CONTRAST TECHNIQUE: Multidetector CT imaging of the abdomen and pelvis was performed using the standard protocol following bolus administration of intravenous contrast. RADIATION DOSE REDUCTION: This exam was performed  according to the departmental dose-optimization program which includes automated exposure control, adjustment of the mA and/or kV according to patient size and/or use of iterative reconstruction technique. CONTRAST:  75mL OMNIPAQUE  IOHEXOL  350 MG/ML SOLN COMPARISON:  05/22/2015 FINDINGS: Lower chest: Motion artifact limits evaluation. Moderate esophageal hiatal hernia. Hepatobiliary: Motion artifact limits evaluation. No gross focal liver lesion. Gallbladder is surgically absent. No bile duct dilatation. Pancreas: Motion artifact limits evaluation. No gross mass or infiltration. Spleen: Motion artifact limits evaluation. No focal lesion identified. Adrenals/Urinary Tract: Motion artifact limits evaluation. As visualized, no adrenal gland nodules are identified. Kidneys appear symmetrical. No hydronephrosis or hydroureter. Bladder is decompressed and appears normal. Stomach/Bowel: Stomach, small bowel, and colon are not abnormally distended. Stool throughout the colon with areas of liquid stool possibly representing viral process. No wall thickening or infiltration. Appendix is normal. Vascular/Lymphatic: No significant vascular findings are present. No enlarged abdominal or pelvic lymph nodes. Reproductive: Status post hysterectomy. No adnexal masses. Small amount of free fluid in the pelvis is nonspecific, probably reactive. Other: No free air in the abdomen. Abdominal wall musculature appears intact. Musculoskeletal: Degenerative changes in the spine. No acute bony abnormalities. IMPRESSION: 1. Liquid stool in the colon may indicate viral colitis. 2. Moderate-sized esophageal hiatal hernia. 3. Motion artifact limits examination. Electronically Signed   By: Elsie Gravely M.D.   On: 12/15/2023 15:19     Procedures   Medications Ordered in the ED  potassium chloride SA (KLOR-CON M) CR tablet 40 mEq (has no administration in time range)  acetaminophen  (TYLENOL ) tablet 1,000 mg (1,000 mg Oral Given 12/15/23  1424)  lactated ringers bolus 1,000 mL (1,000 mLs Intravenous New Bag/Given 12/15/23 1424)  iohexol  (OMNIPAQUE ) 350 MG/ML injection 75 mL (75 mLs Intravenous Contrast Given 12/15/23 1457)  Medical Decision Making Amount and/or Complexity of Data Reviewed Labs: ordered. Decision-making details documented in ED Course. Radiology: ordered and independent interpretation performed. Decision-making details documented in ED Course.  Risk Prescription drug management.   Pt with multiple medical problems and comorbidities and presenting today with a complaint that caries a high risk for morbidity and mortality.  Here today with the above complaint.  Concern for viral illness versus bacterial diarrhea given her recent travel versus C. difficile as she was also in antibiotics in the last month.  Patient does have some midline abdominal tenderness and will get a CT to ensure no other acute issues such as abscess, diverticulitis.  Patient is hemodynamically stable here but did have a temperature of 101.9.  She is denying any urinary symptoms and symptoms are not classic for pyelonephritis.  Patient was given Tylenol  for her fever. I independently interpreted patient's labs and CBC without acute findings, Chem-8 and CMP shows some mild AKI with creatinine of 1.16 and hypokalemia of 3.23 most likely related to the diarrhea.  C. difficile and GI pathogen panel were ordered.  Patient given IV fluids and potassium.  I have independently visualized and interpreted pt's images today.  CT of the abdomen pelvis without acute findings.  Radiology reported liquid stool in the colon may indicate viral colitis and an esophageal hernia.      Final diagnoses:  None    ED Discharge Orders     None          Doretha Folks, MD 12/15/23 1644

## 2023-12-15 NOTE — ED Triage Notes (Signed)
 Pt c/o abdominal pain, weakness and incontinent of urine and stool for 2 days. Temp 101.9

## 2023-12-15 NOTE — Discharge Instructions (Signed)
 Your history, exam, workup today seem consistent a likely viral colitis or viral diarrheal illness.  Your C. difficile test was negative and the stool pathogen panel was sent out.  If something is positive, they should call you with the results if it needs antibiotics.  Otherwise please rest and stay hydrated.  If any symptoms change or worsen acutely, please return to the nearest Emergency Department.

## 2023-12-15 NOTE — ED Provider Triage Note (Signed)
 Emergency Medicine Provider Triage Evaluation Note  Jocelyn Little , a 73 y.o. female  was evaluated in triage.  Pt complains of generalized abdominal pain, diarrhea for the past couple days.  No blood in stool.  Endorses a fever.  No nausea, vomiting, chest pain, or shortness of breath.  Has had something similar a month ago when she was in [place] and she was admitted for 2 days.  She is unsure of what the diagnosis was but she left after 2 days and [place].  Review of Systems  Positive: As above Negative: As above  Physical Exam  BP 134/60   Pulse 96   Temp (!) 101.9 F (38.8 C)   Resp 18   SpO2 91%  Gen:   Awake, no distress   Resp:  Normal effort  MSK:   Moves extremities without difficulty Other:  Abdomen soft, without distention.  No guarding.  Medical Decision Making  Medically screening exam initiated at 1:05 PM.  Appropriate orders placed.  Jocelyn Little was informed that the remainder of the evaluation will be completed by another provider, this initial triage assessment does not replace that evaluation, and the importance of remaining in the ED until their evaluation is complete.     Hildegard Loge, PA-C 12/15/23 1306

## 2023-12-15 NOTE — ED Provider Notes (Signed)
 Care assumed from Dr. Doretha.  At time of transfer of care, patient is waiting for results of urinalysis and C. difficile test.  If patient is well-appearing on reassessment, anticipate discharge home for outpatient follow-up.  6:51 PM C. difficile test was negative and urinalysis does not show UTI.  We discussed that she would likely be called if her stool pathogen panel was positive for something that would need antibiotics but otherwise patient's vital signs have improved and patient appears safe for discharge home.  Patient agrees to increase her hydration and follow-up with the PCP and follow-up on the results.  She had otherwise or concerns and was discharged in stable condition.   Clinical Impression: 1. Diarrhea, unspecified type   2. Fever, unspecified fever cause   3. Dehydration     Disposition: Discharge  Condition: Good  I have discussed the results, Dx and Tx plan with the pt(& family if present). He/she/they expressed understanding and agree(s) with the plan. Discharge instructions discussed at great length. Strict return precautions discussed and pt &/or family have verbalized understanding of the instructions. No further questions at time of discharge.    New Prescriptions   No medications on file    Follow Up: Stephane Leita DEL, MD [address] [place] [address] [phone]     [facility] Emergency Department at [facility] [address] [place]  [address] [phone]         Haden Cavenaugh, Lonni PARAS, MD 12/15/23 [phone]

## 2023-12-15 NOTE — ED Notes (Signed)
 Pt reports generalized abdominal pain and diarrhea. Pt reports 1 month ago she was admitted in [place] for the same x 2 days.

## 2023-12-17 ENCOUNTER — Ambulatory Visit (HOSPITAL_COMMUNITY): Payer: Self-pay

## 2023-12-17 LAB — GASTROINTESTINAL PANEL BY PCR, STOOL (REPLACES STOOL CULTURE)
Adenovirus F40/41: NOT DETECTED
Astrovirus: NOT DETECTED
Campylobacter species: NOT DETECTED
Cryptosporidium: NOT DETECTED
Cyclospora cayetanensis: NOT DETECTED
Entamoeba histolytica: NOT DETECTED
Enteroaggregative E coli (EAEC): DETECTED — AB
Enteropathogenic E coli (EPEC): NOT DETECTED
Enterotoxigenic E coli (ETEC): DETECTED — AB
Giardia lamblia: NOT DETECTED
Norovirus GI/GII: NOT DETECTED
Plesimonas shigelloides: NOT DETECTED
Rotavirus A: NOT DETECTED
Salmonella species: NOT DETECTED
Sapovirus (I, II, IV, and V): NOT DETECTED
Shiga like toxin producing E coli (STEC): NOT DETECTED
Shigella/Enteroinvasive E coli (EIEC): DETECTED — AB
Vibrio cholerae: NOT DETECTED
Vibrio species: NOT DETECTED
Yersinia enterocolitica: NOT DETECTED

## 2023-12-28 ENCOUNTER — Other Ambulatory Visit: Payer: Self-pay

## 2023-12-28 ENCOUNTER — Other Ambulatory Visit (HOSPITAL_BASED_OUTPATIENT_CLINIC_OR_DEPARTMENT_OTHER): Payer: Self-pay

## 2023-12-29 ENCOUNTER — Other Ambulatory Visit (HOSPITAL_BASED_OUTPATIENT_CLINIC_OR_DEPARTMENT_OTHER): Payer: Self-pay

## 2024-01-11 LAB — MISCELLANEOUS TEST

## 2024-03-08 ENCOUNTER — Encounter: Admitting: Pulmonary Disease

## 2024-03-08 DIAGNOSIS — G4733 Obstructive sleep apnea (adult) (pediatric): Secondary | ICD-10-CM

## 2024-03-08 NOTE — Assessment & Plan Note (Deleted)
 SABRA

## 2024-03-08 NOTE — Progress Notes (Deleted)
 "  Established Patient Pulmonology Office Visit   Subjective:  Patient ID: Jocelyn Little, female    DOB: [birth year]   MRN: [id number]  CC: No chief complaint on file.   Jocelyn Little is a 73 y.o. patient with T2DM, hypertension, GERD and OSA who presents for follow up. She is transitioning her care from Dr. Neysa who is retiring.  In 2008, at a weight of 244 lbs (BMI 41.6), she underwent an in-lab CPAP Titration during which she was titrated to an optimal pressure of 16 cm H2O. This was ordered in response to an earlier PSG from late 2007 which showed a baseline AHI of 106.6/hr.  Over the subsequent years she has achieved significant weight loss with most recent weight of 194 lbs. She recently underwent a HST while on CPAP which showed a residual AHI of 11-12/hr. Order subsequently sent to [facility] to change CPAP pressure to 10-20 cm H2O.  ***  Pulm Questionnaires:      No data to display           ROS  {History (Optional):24728} Current Medications[1]      Objective:  There were no vitals taken for this visit. {Pulm Vitals (Optional):33986}  Physical Exam Constitutional:      Appearance: Normal appearance.  HENT:     Head: Normocephalic and atraumatic.     Mouth/Throat:     Mouth: Mucous membranes are moist.     Pharynx: Oropharynx is clear. No oropharyngeal exudate or posterior oropharyngeal erythema.  Eyes:     General: No scleral icterus.    Conjunctiva/sclera: Conjunctivae normal.  Cardiovascular:     Rate and Rhythm: Normal rate and regular rhythm.     Heart sounds: Normal heart sounds. No murmur heard.    No friction rub. No gallop.  Pulmonary:     Effort: Pulmonary effort is normal. No respiratory distress.     Breath sounds: Normal breath sounds. No stridor. No wheezing, rhonchi or rales.  Musculoskeletal:     Right lower leg: No edema.     Left lower leg: No edema.  Neurological:     Mental Status: She is alert and oriented to person,  place, and time.  Psychiatric:        Thought Content: Thought content normal.     Diagnostic Review:  {Labs (Optional):33987}  Serum HCO3: CO2  Date/Time Value Ref Range Status  12/15/2023 01:04 PM 25 22 - 32 mmol/L Final  07/05/2023 03:28 PM 25 20 - 29 mmol/L Final  10/11/2022 02:57 PM 28 22 - 32 mmol/L Final    Hemoglobin A1c:    Component Value Date/Time   HGBA1C 6.3 (H) 08/11/2017 1159   HGBA1C 6.2 05/18/2017 1443   HGBA1C 6.4 02/08/2017 1140   HGBA1C 6.3 02/25/2016 1333    TSH: TSH  Date/Time Value Ref Range Status  05/21/2016 11:35 AM 1.730 0.450 - 4.500 uIU/mL Final    Iron Studies: No results found for: IRON, TIBC, IRONPCTSAT, FERRITIN  ABG:    Component Value Date/Time   TCO2 25 12/15/2023 1316     VBG: No results found for: PHVEN, PCO2VEN, PO2VEN   PFTs: Office Spirometry 10/26/16-WNL-FVC 1.97/78%, FEV1 1.61/82%, ratio 0.82, FEF 25-75% 1.59/84%   Sleep Studies: NPSG 11/30/05- AHI 106.6/hour, desaturation to 77%, body weight 250 pounds  CPAP Titration (2008) - Wt 244 lbs (BMI 41.6). Optimal pressure 16 cm H2O.     Assessment & Plan:   Assessment & Plan Obstructive sleep apnea  No follow-ups on file.   Time spent on day of this encounter (includes time spent face-to-face with the patient as well as time spent the same day as the encounter reviewing existing data and notes, and/or documenting my findings and the plan of care): *** minutes  Lamar JINNY Dales, MD     [1]  Current Outpatient Medications:    acetaminophen  (TYLENOL ) 650 MG CR tablet, Take 1,300 mg by mouth every 8 (eight) hours as needed for pain., Disp: , Rfl:    albuterol  (PROVENTIL  HFA;VENTOLIN  HFA) 108 (90 Base) MCG/ACT inhaler, Inhale 2 puffs into the lungs every 6 (six) hours as needed for wheezing., Disp: 1 Inhaler, Rfl: 11   albuterol  (VENTOLIN  HFA) 108 (90 Base) MCG/ACT inhaler, Inhale 2 puffs into the lungs every 6 (six) hours as needed for  wheezing or shortness of breath., Disp: 8 g, Rfl: 12   amLODipine  (NORVASC ) 5 MG tablet, TAKE 1 TABLET BY MOUTH DAILY (Patient taking differently: Take 2.5 mg by mouth daily.), Disp: 90 tablet, Rfl: 3   aspirin EC 81 MG tablet, Take 81 mg by mouth daily., Disp: , Rfl:    blood glucose meter kit and supplies KIT, Dispense based on patient and insurance preference. Use to check cbgs qd - fasting in the morning or 2 hours after a meal, E11.9, Disp: 1 each, Rfl: 0   buPROPion  (WELLBUTRIN  XL) 150 MG 24 hr tablet, Take 1 tablet (150 mg total) by mouth daily., Disp: 90 tablet, Rfl: 3   Cyanocobalamin  1000 MCG/ML LIQD, Take 1 Dose by mouth 2 (two) times a week., Disp: , Rfl:    cyclobenzaprine  (FLEXERIL ) 10 MG tablet, Take 1 tablet (10 mg total) by mouth 2 (two) times daily as needed for muscle spasms., Disp: 20 tablet, Rfl: 0   esomeprazole  (NEXIUM ) 40 MG capsule, Take 1 capsule (40 mg total) by mouth as needed (for indigestion)., Disp: 30 capsule, Rfl: 11   fluticasone  (FLONASE ) 50 MCG/ACT nasal spray, Place 1 spray into both nostrils daily as needed., Disp: , Rfl:    Fluticasone -Umeclidin-Vilant (TRELEGY ELLIPTA ) 100-62.5-25 MCG/ACT AEPB, Inhale 1 puff then rinse mouth, once daily, Disp: 60 each, Rfl: 12   HYDROcodone -acetaminophen  (NORCO/VICODIN) 5-325 MG tablet, Take 1 tablet by mouth every 4 (four) hours as needed., Disp: 10 tablet, Rfl: 0   Ibuprofen -diphenhydrAMINE Cit (ADVIL  PM) 200-38 MG TABS, Take 1 tablet by mouth at bedtime as needed (sleep)., Disp: , Rfl:    loratadine  (CLARITIN ) 10 MG tablet, 1 daily as needed for allergy, Disp: 30 tablet, Rfl: 11   lovastatin  (MEVACOR ) 20 MG tablet, Take 1 tablet (20 mg total) by mouth at bedtime., Disp: 90 tablet, Rfl: 0   meloxicam  (MOBIC ) 7.5 MG tablet, Take 1-2 tablets (7.5-15 mg total) by mouth daily. (Patient taking differently: Take 7.5-15 mg by mouth daily as needed for pain.), Disp: 30 tablet, Rfl: 5   metFORMIN  (GLUCOPHAGE ) 500 MG tablet, Take 1  tablet (500 mg total) by mouth daily with breakfast., Disp: 90 tablet, Rfl: 3   methocarbamol  (ROBAXIN ) 500 MG tablet, Take 1 tablet (500 mg total) by mouth 2 (two) times daily., Disp: 20 tablet, Rfl: 0   metoprolol  tartrate (LOPRESSOR ) 50 MG tablet, TAKE 1 TABLET BY MOUTH DAILY (Patient taking differently: Take 50 mg by mouth at bedtime.), Disp: 90 tablet, Rfl: 3   mirabegron ER (MYRBETRIQ) 50 MG TB24 tablet, Take 50 mg by mouth every evening., Disp: , Rfl:    oxybutynin (DITROPAN-XL) 10 MG 24 hr tablet, Take 10  mg by mouth at bedtime., Disp: , Rfl:    oxyCODONE -acetaminophen  (PERCOCET) 5-325 MG tablet, Take 1 tablet by mouth every 8 (eight) hours as needed for severe pain., Disp: 10 tablet, Rfl: 0   tirzepatide  (MOUNJARO ) 10 MG/0.5ML Pen, Inject 10 mg into the skin once a week., Disp: 6 mL, Rfl: 3   tirzepatide  (MOUNJARO ) 7.5 MG/0.5ML Pen, Inject 7.5 mg into the skin once a week., Disp: 2 mL, Rfl: 3   tirzepatide  (MOUNJARO ) 7.5 MG/0.5ML Pen, Inject 7.5 mg into the skin once a week., Disp: 2 mL, Rfl: 5   vortioxetine  HBr (TRINTELLIX ) 20 MG TABS tablet, Take 1 tablet (20 mg total) by mouth daily., Disp: 90 tablet, Rfl: 3   XIIDRA 5 % SOLN, Place 1 drop into both eyes 2 (two) times daily., Disp: , Rfl:   Current Facility-Administered Medications:    0.9 %  sodium chloride  infusion, 500 mL, Intravenous, Continuous, Teressa Toribio SQUIBB, MD  "

## 2024-03-24 ENCOUNTER — Other Ambulatory Visit (HOSPITAL_BASED_OUTPATIENT_CLINIC_OR_DEPARTMENT_OTHER): Payer: Self-pay

## 2024-03-27 ENCOUNTER — Other Ambulatory Visit (HOSPITAL_BASED_OUTPATIENT_CLINIC_OR_DEPARTMENT_OTHER): Payer: Self-pay
